# Patient Record
Sex: Female | Born: 1990 | Race: Black or African American | Hispanic: No | Marital: Single | Smoking: Current some day smoker
Health system: Southern US, Community
[De-identification: ages and names within clinical notes are randomized; demographics above are authoritative.]

## PROBLEM LIST (undated history)

## (undated) ENCOUNTER — Inpatient Hospital Stay (HOSPITAL_COMMUNITY): Payer: Self-pay

## (undated) DIAGNOSIS — F329 Major depressive disorder, single episode, unspecified: Secondary | ICD-10-CM

## (undated) DIAGNOSIS — F319 Bipolar disorder, unspecified: Secondary | ICD-10-CM

## (undated) DIAGNOSIS — F32A Depression, unspecified: Secondary | ICD-10-CM

## (undated) DIAGNOSIS — Z202 Contact with and (suspected) exposure to infections with a predominantly sexual mode of transmission: Secondary | ICD-10-CM

## (undated) DIAGNOSIS — O24419 Gestational diabetes mellitus in pregnancy, unspecified control: Secondary | ICD-10-CM

## (undated) HISTORY — PX: MOUTH SURGERY: SHX715

---

## 2009-05-18 ENCOUNTER — Encounter: Payer: Self-pay | Admitting: *Deleted

## 2009-12-24 ENCOUNTER — Ambulatory Visit (HOSPITAL_COMMUNITY): Admission: RE | Admit: 2009-12-24 | Discharge: 2009-12-24 | Payer: Self-pay | Admitting: Obstetrics & Gynecology

## 2010-01-12 ENCOUNTER — Ambulatory Visit (HOSPITAL_COMMUNITY): Admission: RE | Admit: 2010-01-12 | Discharge: 2010-01-12 | Payer: Self-pay | Admitting: Obstetrics & Gynecology

## 2010-03-11 ENCOUNTER — Ambulatory Visit (HOSPITAL_COMMUNITY): Admission: RE | Admit: 2010-03-11 | Discharge: 2010-03-11 | Payer: Self-pay | Admitting: Obstetrics & Gynecology

## 2010-03-25 ENCOUNTER — Ambulatory Visit (HOSPITAL_COMMUNITY): Admission: RE | Admit: 2010-03-25 | Discharge: 2010-03-25 | Payer: Self-pay | Admitting: Obstetrics & Gynecology

## 2010-05-11 ENCOUNTER — Inpatient Hospital Stay (HOSPITAL_COMMUNITY): Admission: AD | Admit: 2010-05-11 | Discharge: 2010-05-11 | Payer: Self-pay | Admitting: Obstetrics & Gynecology

## 2010-05-12 ENCOUNTER — Inpatient Hospital Stay (HOSPITAL_COMMUNITY): Admission: AD | Admit: 2010-05-12 | Discharge: 2010-05-14 | Payer: Self-pay | Admitting: Obstetrics & Gynecology

## 2010-05-20 ENCOUNTER — Inpatient Hospital Stay (HOSPITAL_COMMUNITY): Admission: AD | Admit: 2010-05-20 | Discharge: 2010-05-20 | Payer: Self-pay | Admitting: Obstetrics & Gynecology

## 2010-05-20 ENCOUNTER — Ambulatory Visit: Payer: Self-pay | Admitting: Nurse Practitioner

## 2010-07-25 NOTE — L&D Delivery Note (Signed)
Delivery Note At 6:06 AM a viable and healthy female was delivered via Vaginal, Spontaneous Delivery (Presentation: Left Occiput Anterior).  APGAR: 9, 9; weight  .   Placenta status: Intact, Spontaneous.  Cord: 3 vessels with the following complications: Nuchal cord x 1, reduced after delivery   Anesthesia:  Epidural  Episiotomy: None Lacerations: None Est. Blood Loss (mL): 250  Mom to postpartum.  Baby to nursery-stable.  Clinton Sawyer, Ahjanae Cassel 05/04/2011, 6:25 AM

## 2010-08-06 ENCOUNTER — Emergency Department (HOSPITAL_COMMUNITY)
Admission: EM | Admit: 2010-08-06 | Discharge: 2010-08-06 | Payer: Self-pay | Source: Home / Self Care | Admitting: Emergency Medicine

## 2010-08-07 ENCOUNTER — Inpatient Hospital Stay (HOSPITAL_COMMUNITY)
Admission: AD | Admit: 2010-08-07 | Discharge: 2010-08-07 | Payer: Self-pay | Source: Home / Self Care | Attending: Obstetrics | Admitting: Obstetrics

## 2010-08-09 LAB — GC/CHLAMYDIA PROBE AMP, GENITAL
Chlamydia, DNA Probe: NEGATIVE
GC Probe Amp, Genital: NEGATIVE

## 2010-08-09 LAB — ABO/RH: ABO/RH(D): O POS

## 2010-08-09 LAB — CBC
HCT: 40.3 % (ref 36.0–46.0)
Hemoglobin: 13.1 g/dL (ref 12.0–15.0)
MCH: 27.1 pg (ref 26.0–34.0)
MCHC: 32.5 g/dL (ref 30.0–36.0)
MCV: 83.3 fL (ref 78.0–100.0)
Platelets: 294 10*3/uL (ref 150–400)
RBC: 4.84 MIL/uL (ref 3.87–5.11)
RDW: 13.7 % (ref 11.5–15.5)
WBC: 5 10*3/uL (ref 4.0–10.5)

## 2010-08-09 LAB — WET PREP, GENITAL
Trich, Wet Prep: NONE SEEN
Yeast Wet Prep HPF POC: NONE SEEN

## 2010-08-09 LAB — POCT I-STAT, CHEM 8
BUN: 10 mg/dL (ref 6–23)
Calcium, Ion: 1.08 mmol/L — ABNORMAL LOW (ref 1.12–1.32)
Chloride: 107 mEq/L (ref 96–112)
Creatinine, Ser: 0.6 mg/dL (ref 0.4–1.2)
Glucose, Bld: 89 mg/dL (ref 70–99)
HCT: 42 % (ref 36.0–46.0)
Hemoglobin: 14.3 g/dL (ref 12.0–15.0)
Potassium: 3.5 mEq/L (ref 3.5–5.1)
Sodium: 140 mEq/L (ref 135–145)
TCO2: 25 mmol/L (ref 0–100)

## 2010-08-09 LAB — DIFFERENTIAL
Basophils Absolute: 0.1 10*3/uL (ref 0.0–0.1)
Basophils Relative: 1 % (ref 0–1)
Eosinophils Absolute: 0.1 10*3/uL (ref 0.0–0.7)
Eosinophils Relative: 1 % (ref 0–5)
Lymphocytes Relative: 47 % — ABNORMAL HIGH (ref 12–46)
Lymphs Abs: 2.3 10*3/uL (ref 0.7–4.0)
Monocytes Absolute: 0.4 10*3/uL (ref 0.1–1.0)
Monocytes Relative: 9 % (ref 3–12)
Neutro Abs: 2.1 10*3/uL (ref 1.7–7.7)
Neutrophils Relative %: 42 % — ABNORMAL LOW (ref 43–77)

## 2010-08-09 LAB — HCG, QUANTITATIVE, PREGNANCY: hCG, Beta Chain, Quant, S: 283 m[IU]/mL — ABNORMAL HIGH (ref <5)

## 2010-08-09 LAB — POCT PREGNANCY, URINE: Preg Test, Ur: POSITIVE

## 2010-10-06 LAB — COMPREHENSIVE METABOLIC PANEL
ALT: 11 U/L (ref 0–35)
ALT: 13 U/L (ref 0–35)
AST: 16 U/L (ref 0–37)
AST: 19 U/L (ref 0–37)
Albumin: 3.1 g/dL — ABNORMAL LOW (ref 3.5–5.2)
Albumin: 3.5 g/dL (ref 3.5–5.2)
Alkaline Phosphatase: 108 U/L (ref 39–117)
Alkaline Phosphatase: 160 U/L — ABNORMAL HIGH (ref 39–117)
BUN: 12 mg/dL (ref 6–23)
BUN: 7 mg/dL (ref 6–23)
CO2: 22 mEq/L (ref 19–32)
CO2: 26 mEq/L (ref 19–32)
Calcium: 8.8 mg/dL (ref 8.4–10.5)
Calcium: 8.9 mg/dL (ref 8.4–10.5)
Chloride: 105 mEq/L (ref 96–112)
Chloride: 107 mEq/L (ref 96–112)
Creatinine, Ser: 0.63 mg/dL (ref 0.4–1.2)
Creatinine, Ser: 0.77 mg/dL (ref 0.4–1.2)
GFR calc Af Amer: >60 mL/min (ref >60)
GFR calc Af Amer: >60 mL/min (ref >60)
GFR calc non Af Amer: >60 mL/min (ref >60)
GFR calc non Af Amer: >60 mL/min (ref >60)
Glucose, Bld: 75 mg/dL (ref 70–99)
Glucose, Bld: 91 mg/dL (ref 70–99)
Potassium: 3.6 mEq/L (ref 3.5–5.1)
Potassium: 3.7 mEq/L (ref 3.5–5.1)
Sodium: 135 mEq/L (ref 135–145)
Sodium: 139 mEq/L (ref 135–145)
Total Bilirubin: 0.4 mg/dL (ref 0.3–1.2)
Total Bilirubin: 0.7 mg/dL (ref 0.3–1.2)
Total Protein: 5.9 g/dL — ABNORMAL LOW (ref 6.0–8.3)
Total Protein: 6.9 g/dL (ref 6.0–8.3)

## 2010-10-06 LAB — URINALYSIS, ROUTINE W REFLEX MICROSCOPIC
Bilirubin Urine: NEGATIVE
Glucose, UA: NEGATIVE mg/dL
Hgb urine dipstick: NEGATIVE
Ketones, ur: 15 mg/dL — AB
Ketones, ur: NEGATIVE mg/dL
Nitrite: NEGATIVE
Nitrite: NEGATIVE
Protein, ur: NEGATIVE mg/dL
Specific Gravity, Urine: 1.025 (ref 1.005–1.030)
Urobilinogen, UA: 0.2 mg/dL (ref 0.0–1.0)
pH: 6 (ref 5.0–8.0)
pH: 6 (ref 5.0–8.0)

## 2010-10-06 LAB — CBC
HCT: 38.5 % (ref 36.0–46.0)
HCT: 40 % (ref 36.0–46.0)
HCT: 42.8 % (ref 36.0–46.0)
Hemoglobin: 12.8 g/dL (ref 12.0–15.0)
Hemoglobin: 13.2 g/dL (ref 12.0–15.0)
Hemoglobin: 13.9 g/dL (ref 12.0–15.0)
MCH: 28 pg (ref 26.0–34.0)
MCH: 28.4 pg (ref 26.0–34.0)
MCH: 28.6 pg (ref 26.0–34.0)
MCHC: 32.4 g/dL (ref 30.0–36.0)
MCHC: 33.1 g/dL (ref 30.0–36.0)
MCHC: 33.3 g/dL (ref 30.0–36.0)
MCV: 85.8 fL (ref 78.0–100.0)
MCV: 85.9 fL (ref 78.0–100.0)
MCV: 86.6 fL (ref 78.0–100.0)
Platelets: 212 10*3/uL (ref 150–400)
Platelets: 215 10*3/uL (ref 150–400)
Platelets: 334 10*3/uL (ref 150–400)
RBC: 4.49 MIL/uL (ref 3.87–5.11)
RBC: 4.66 MIL/uL (ref 3.87–5.11)
RBC: 4.94 MIL/uL (ref 3.87–5.11)
RDW: 13.4 % (ref 11.5–15.5)
RDW: 13.6 % (ref 11.5–15.5)
RDW: 13.8 % (ref 11.5–15.5)
WBC: 10 10*3/uL (ref 4.0–10.5)
WBC: 12.2 10*3/uL — ABNORMAL HIGH (ref 4.0–10.5)
WBC: 21.5 10*3/uL — ABNORMAL HIGH (ref 4.0–10.5)

## 2010-10-06 LAB — URINE MICROSCOPIC-ADD ON

## 2010-10-06 LAB — URIC ACID
Uric Acid, Serum: 4.7 mg/dL (ref 2.4–7.0)
Uric Acid, Serum: 5.7 mg/dL (ref 2.4–7.0)

## 2010-10-06 LAB — URINE CULTURE
Colony Count: >=100000
Culture  Setup Time: 201110280253

## 2010-10-06 LAB — RPR: RPR Ser Ql: NONREACTIVE

## 2011-01-19 ENCOUNTER — Inpatient Hospital Stay (HOSPITAL_COMMUNITY)
Admission: AD | Admit: 2011-01-19 | Discharge: 2011-01-19 | Disposition: A | Discharge status: Home / Self Care | Payer: Medicaid Other | Source: Ambulatory Visit | Attending: Obstetrics and Gynecology | Admitting: Obstetrics and Gynecology

## 2011-01-19 ENCOUNTER — Inpatient Hospital Stay (HOSPITAL_COMMUNITY): Payer: Medicaid Other

## 2011-01-19 DIAGNOSIS — R109 Unspecified abdominal pain: Secondary | ICD-10-CM

## 2011-01-19 DIAGNOSIS — O9989 Other specified diseases and conditions complicating pregnancy, childbirth and the puerperium: Secondary | ICD-10-CM

## 2011-01-19 DIAGNOSIS — O99891 Other specified diseases and conditions complicating pregnancy: Secondary | ICD-10-CM | POA: Insufficient documentation

## 2011-01-19 LAB — URINALYSIS, ROUTINE W REFLEX MICROSCOPIC
Ketones, ur: 15 mg/dL — AB
Nitrite: NEGATIVE
Protein, ur: NEGATIVE mg/dL
Urobilinogen, UA: 1 mg/dL (ref 0.0–1.0)
pH: 6 (ref 5.0–8.0)

## 2011-01-21 LAB — URINE CULTURE
Colony Count: 4000
Culture  Setup Time: 201206280423

## 2011-02-11 ENCOUNTER — Emergency Department (HOSPITAL_COMMUNITY)
Admission: EM | Admit: 2011-02-11 | Discharge: 2011-02-11 | Disposition: A | Discharge status: Home / Self Care | Payer: Medicaid Other | Attending: Emergency Medicine | Admitting: Emergency Medicine

## 2011-02-11 DIAGNOSIS — Z711 Person with feared health complaint in whom no diagnosis is made: Secondary | ICD-10-CM | POA: Insufficient documentation

## 2011-03-26 ENCOUNTER — Encounter (HOSPITAL_COMMUNITY): Payer: Self-pay

## 2011-03-26 ENCOUNTER — Inpatient Hospital Stay (HOSPITAL_COMMUNITY)
Admission: AD | Admit: 2011-03-26 | Discharge: 2011-03-26 | Disposition: A | Discharge status: Home Health | Payer: Medicaid Other | Source: Ambulatory Visit | Attending: Obstetrics & Gynecology | Admitting: Obstetrics & Gynecology

## 2011-03-26 DIAGNOSIS — O479 False labor, unspecified: Secondary | ICD-10-CM

## 2011-03-26 DIAGNOSIS — Z3480 Encounter for supervision of other normal pregnancy, unspecified trimester: Secondary | ICD-10-CM

## 2011-03-26 DIAGNOSIS — O4700 False labor before 37 completed weeks of gestation, unspecified trimester: Secondary | ICD-10-CM | POA: Insufficient documentation

## 2011-03-26 LAB — URINALYSIS, ROUTINE W REFLEX MICROSCOPIC
Bilirubin Urine: NEGATIVE
Hgb urine dipstick: NEGATIVE
Ketones, ur: NEGATIVE mg/dL
Nitrite: NEGATIVE
Urobilinogen, UA: 1 mg/dL (ref 0.0–1.0)

## 2011-03-26 LAB — CBC
HCT: 35.5 % — ABNORMAL LOW (ref 36.0–46.0)
Hemoglobin: 11.7 g/dL — ABNORMAL LOW (ref 12.0–15.0)
RDW: 13.3 % (ref 11.5–15.5)
WBC: 6.7 10*3/uL (ref 4.0–10.5)

## 2011-03-26 LAB — DIFFERENTIAL
Basophils Absolute: 0 10*3/uL (ref 0.0–0.1)
Basophils Relative: 0 % (ref 0–1)
Lymphocytes Relative: 35 % (ref 12–46)
Monocytes Absolute: 0.5 10*3/uL (ref 0.1–1.0)
Neutro Abs: 3.8 10*3/uL (ref 1.7–7.7)
Neutrophils Relative %: 57 % (ref 43–77)

## 2011-03-26 NOTE — Progress Notes (Signed)
Pt here to find out the sex of the baby. Pt has had no prenatal care.

## 2011-03-26 NOTE — Progress Notes (Signed)
Onset of lower abdominal cramping for one week, feels baby balling up a lot.

## 2011-03-26 NOTE — ED Notes (Signed)
Beginning of this fetal strip is documented under MR # [id number]

## 2011-03-26 NOTE — ED Provider Notes (Signed)
History     Chief Complaint  Patient presents with  . Abdominal Cramping   Abdominal Cramping This is a new problem. The current episode started in the past 7 days. The onset quality is gradual. The problem occurs intermittently. The pain is located in the RLQ and LLQ. The pain is moderate. The quality of the pain is cramping. The abdominal pain does not radiate. Pertinent negatives include no constipation, diarrhea, dysuria, frequency, nausea, vomiting or weight loss. The pain is aggravated by nothing. The pain is relieved by nothing. She has tried nothing for the symptoms.    OB History    Grav Para Term Preterm Abortions TAB SAB Ect Mult Living   1               No past medical history on file.  No past surgical history on file.  No family history on file.  History  Substance Use Topics  . Smoking status: Not on file  . Smokeless tobacco: Not on file  . Alcohol Use: Not on file    Allergies: No Known Allergies  No prescriptions prior to admission    Review of Systems  Constitutional: Negative for weight loss.  Gastrointestinal: Negative for nausea, vomiting, diarrhea and constipation.  Genitourinary: Negative for dysuria and frequency.   Physical Exam   Blood pressure 118/78, pulse 75, temperature 98.2 F (36.8 C), temperature source Oral, resp. rate 16, height 5\' 8"  (1.727 m), weight 72.122 kg (159 lb).  Physical Exam  Constitutional: She appears well-developed and well-nourished. No distress.  HENT:  Head: Normocephalic.  Eyes: EOM are normal. Pupils are equal, round, and reactive to light.  Neck: Normal range of motion.  Cardiovascular: Normal rate, regular rhythm and normal heart sounds.  Exam reveals no gallop and no friction rub.   No murmur heard. Respiratory: Effort normal and breath sounds normal. No respiratory distress. She has no wheezes. She has no rales. She exhibits no tenderness.  GI: Soft. Bowel sounds are normal. She exhibits no distension  and no mass. There is no tenderness. There is no rebound and no guarding.  Skin: She is not diaphoretic.   Dilation: 1 Effacement (%): Thick Exam by:: Dr. Adrian Blackwater    MAU Course  Procedures NST  FHT 140s, mod variability, + accel, no decel.  Assessment and Plan  1.  Braxton Hicks contractions Preterm labor handout given.  FHT category 1.  Prenatal labs drawn.  Encouraged pt to follow up at low risk clinic.    Willer Osorno JEHIEL 03/26/2011, 6:16 PM

## 2011-03-27 LAB — RPR: RPR Ser Ql: NONREACTIVE

## 2011-03-27 LAB — RUBELLA SCREEN: Rubella: 86 IU/mL — ABNORMAL HIGH

## 2011-03-27 LAB — SICKLE CELL SCREEN: Sickle Cell Screen: NEGATIVE

## 2011-04-13 ENCOUNTER — Encounter: Payer: Self-pay | Admitting: Family Medicine

## 2011-04-13 ENCOUNTER — Other Ambulatory Visit: Payer: Self-pay

## 2011-04-13 ENCOUNTER — Ambulatory Visit (INDEPENDENT_AMBULATORY_CARE_PROVIDER_SITE_OTHER): Payer: Medicaid Other | Admitting: Obstetrics and Gynecology

## 2011-04-13 DIAGNOSIS — O09299 Supervision of pregnancy with other poor reproductive or obstetric history, unspecified trimester: Secondary | ICD-10-CM

## 2011-04-13 LAB — POCT URINALYSIS DIP (DEVICE)
Glucose, UA: NEGATIVE mg/dL
Hgb urine dipstick: NEGATIVE
Nitrite: NEGATIVE
Urobilinogen, UA: 0.2 mg/dL (ref 0.0–1.0)

## 2011-04-13 LAB — CBC
MCV: 82.9 fL (ref 78.0–100.0)
Platelets: 247 10*3/uL (ref 150–400)
RBC: 4.2 MIL/uL (ref 3.87–5.11)
RDW: 13.5 % (ref 11.5–15.5)
WBC: 7.9 10*3/uL (ref 4.0–10.5)

## 2011-04-13 LAB — RPR

## 2011-04-13 MED ORDER — NATALCARE PIC 60-1 MG PO TABS: 1.0000 | ORAL_TABLET | Freq: Every day | ORAL | Status: DC

## 2011-04-13 MED ORDER — PRENATE ELITE 90-600-400 MG-MCG-MCG PO TABS: 1.0000 | ORAL_TABLET | Freq: Every day | ORAL | Status: DC

## 2011-04-13 NOTE — Assessment & Plan Note (Signed)
SGA in woman with history of IUGR with poor prenatal care-will get U/S within the next week and see back in 1 week.

## 2011-04-13 NOTE — Progress Notes (Signed)
Has some pelvic pressure Pt given initial prenatal information. 1hr gtt today lab draw due at 9:15 Pt would like ultrasound. She wants to know sex of baby

## 2011-04-13 NOTE — Progress Notes (Signed)
Subjective:    Sandra Anderson is a G2P1001 [redacted]w[redacted]d being seen today for her first obstetrical visit. Patient was seen on 6/28 at [facility] for u/s but did not follow up and get prenatal care. Patient also seen 9/1 for Braxton Hix at [facility]. 1st trimester labs drawn-no gc/chlamydia. 2nd trimester labs not drawn.     Her obstetrical history is significant for pregnancy induced hypertension and IUGR (mother reports birth weight less than 5 pounds and decreased amniotic fluid).. Patient does not intend to breast feed. Pregnancy history fully reviewed.  Patient reports fatigue, no bleeding, no leaking and occasional contractions. Patient reports no prenatal vitamins but interested in starting. Reports pelvic pressure once yesterday but not regularly. Wants to know sex of child. Interested in contraception- depo provera top choice, also interested in tubal ligation. Minimal interest in IUD. No prenatal classes but interested.   Filed Vitals:   04/13/11 0802  BP: 128/87  Temp: 98.2 F (36.8 C)  Weight: 160 lb 8 oz (72.802 kg)    HISTORY: OB History    Grav Para Term Preterm Abortions TAB SAB Ect Mult Living   2 1 1  0      1     # Outc Date GA Lbr Len/2nd Wgt Sex Del Anes PTL Lv   1 TRM 10/11 [redacted]w[redacted]d   M SVD None No Yes   2 CUR              Past Medical History: Pregnancy induced Hypertension IUGR for first child  Past Surgical History  Procedure Date  . No past surgeries    History reviewed. No pertinent family history. Brother with sickle cell trait from father. Mother reports no sickle cell trait/disease on her side.  Sohx: stays with son's father's family. Not much support from your own family. Lives with son's father's mother, little sister (86), and older sister (60).    Exam    Uterine Size: size less than dates 32 cm for35 weeks  Pelvic Exam:    Perineum: deferred   Vulva: deferred   Vagina:  deferred   pH: deferred   Cervix: deferred   Adnexa: deferred   Bony  Pelvis: deferred  System: Breast:  normal appearance, no masses or tenderness   Skin: normal coloration and turgor, no rashes    Neurologic: oriented, normal, normal mood   Extremities: normal strength, tone, and muscle mass   HEENT PERRLA, extra ocular movement intact and sclera clear, anicteric   Mouth/Teeth mucous membranes moist, pharynx normal without lesions and dental hygiene good   Neck supple   Cardiovascular: regular rate and rhythm, no murmurs or gallops   Respiratory:  appears well, vitals normal, no respiratory distress, acyanotic, normal RR, neck free of mass or lymphadenopathy, chest clear, no wheezing, crepitations, rhonchi, normal symmetric air entry   Abdomen: soft, non-tender; bowel sounds normal; no masses,  no organomegaly   Urinary: deferred      Assessment:    Pregnancy: G2P1001 There is no problem list on file for this patient.       Plan:     See problem list-SGA in woman with history of IUGR with poor prenatal care-will get U/S within the next week and see back in 1 week.  Initial labs drawn previously. Additional labs today-cbc, rpr, hiv, 1 hour glucola, antibody screen, urine culture (protein and small LE) Deferred pelvic exam until next visit when patient will need GC/Chlamydia/GBS and pap.  Prenatal vitamins.-will give samples and prescription  Genetic Screening discussed-first prenatal visit at 35 weeks so not performed.  Ultrasound discussed; fetal survey: results reviewed.   Tana Conch 04/13/2011

## 2011-04-13 NOTE — Progress Notes (Signed)
Addended by: Caren Griffins C on: 04/13/2011 10:33 AM   Modules accepted: Level of Service

## 2011-04-13 NOTE — Progress Notes (Signed)
Addended by: Faythe Casa on: 04/13/2011 01:50 PM   Modules accepted: Orders

## 2011-04-13 NOTE — Patient Instructions (Addendum)
The nurse will schedule you for an ultrasound. We would like for you to come back in one week. We hope to have your ultrasound completed before your next appointment.   Make sure to pick up your prenatal pills at the pharmacy.   Kick Count Fetal Movement Counts  Kick counts are a good idea for every pregnant woman to do. Start counting fetal movements at 28 weeks of the pregnancy. Fetal movements increase after eating a full meal or eating or drinking something sweet (the blood sugar is higher). It is also important to drink plenty of fluids (well hydrated) before doing the count. Lie on your left side because it helps with the circulation or you can sit in a comfortable chair with your arms over your belly (abdomen) with no distractions around you. DOING THE COUNT:  Try to do the count the same time of day each time you do it.   Mark the day and time, then see how long it takes for you to feel 10 movements (kicks, flutters, swishes, rolls). You should have at least 10 movements within 2 hours. You will most likely feel 10 movements in much less than 2 hours. If you do not, wait an hour and count again. After a couple of days you will see a pattern.   What you are looking for is a change in the pattern or not enough counts in 2 hours. Is it taking longer in time to reach 10 movements?  SEEK MEDICAL CARE IF:  You feel less than 10 counts in 2 hours. Tried twice.   No movement in one hour.   The pattern is changing or taking longer each day to reach 10 counts in 2 hours.   You feel the baby is not moving as it usually does.   Date  Movements Start time Doreatha Martin time  Date  Movements Start time Miguel Barrera  time    Sun      Sun     W  JYN    W  Mon     E  Tue    E  Tue     E  Wed    E  Wed     K  Thur    K  Thur       Fri      Fri       WGN      Sat       Sun      Sun     W  Mon    W  Mon     E  Tue    E  Tue     E  Wed    E  Wed     K  Thur    K  Clovis Cao       Fri      Fri       Sat       Sat       Sun      Sun     W  Mon    W  Mon     E  Tue    E  Tue     E  Wed    E  Wed     K  Clovis Cao    K  Clovis Cao       Fri      Fri       Sat      Sat  Sun      Sun     W  Mon    W  Mon     E  Tue    E  Tue     E  Wed    E  Wed     K  Willaim Rayas       Fri      Fri       ZOX      Sat     Document Released: 08/10/2006 Document Re-Released: 12/29/2009 [facility] Patient Information 2011 [place], [place].

## 2011-04-13 NOTE — Progress Notes (Signed)
I saw pt with the resident and made plan.

## 2011-04-14 LAB — HIV ANTIBODY (ROUTINE TESTING W REFLEX): HIV: NONREACTIVE

## 2011-04-14 LAB — DRUG SCREEN, URINE
Barbiturate Quant, Ur: NEGATIVE
Benzodiazepines.: NEGATIVE
Cocaine Metabolites: NEGATIVE
Creatinine,U: 281 mg/dL

## 2011-04-14 LAB — GLUCOSE TOLERANCE, 1 HOUR: Glucose, 1 Hour GTT: 113 mg/dL (ref 70–140)

## 2011-04-14 LAB — ANTIBODY SCREEN: Antibody Screen: NEGATIVE

## 2011-04-15 ENCOUNTER — Ambulatory Visit (HOSPITAL_COMMUNITY)
Admission: RE | Admit: 2011-04-15 | Discharge: 2011-04-15 | Disposition: A | Discharge status: Home / Self Care | Payer: Medicaid Other | Source: Ambulatory Visit | Attending: Obstetrics and Gynecology | Admitting: Obstetrics and Gynecology

## 2011-04-15 DIAGNOSIS — O365990 Maternal care for other known or suspected poor fetal growth, unspecified trimester, not applicable or unspecified: Secondary | ICD-10-CM | POA: Insufficient documentation

## 2011-04-15 DIAGNOSIS — Z3689 Encounter for other specified antenatal screening: Secondary | ICD-10-CM | POA: Insufficient documentation

## 2011-04-16 LAB — URINE CULTURE: Colony Count: 70000

## 2011-05-04 ENCOUNTER — Encounter (HOSPITAL_COMMUNITY): Payer: Self-pay | Admitting: Anesthesiology

## 2011-05-04 ENCOUNTER — Encounter (HOSPITAL_COMMUNITY): Payer: Self-pay | Admitting: *Deleted

## 2011-05-04 ENCOUNTER — Inpatient Hospital Stay (HOSPITAL_COMMUNITY): Payer: Medicaid Other | Admitting: Anesthesiology

## 2011-05-04 ENCOUNTER — Inpatient Hospital Stay (HOSPITAL_COMMUNITY)
Admission: AD | Admit: 2011-05-04 | Discharge: 2011-05-06 | DRG: 775 | Disposition: A | Discharge status: Home / Self Care | Payer: Medicaid Other | Source: Ambulatory Visit | Attending: Obstetrics & Gynecology | Admitting: Obstetrics & Gynecology

## 2011-05-04 DIAGNOSIS — O0930 Supervision of pregnancy with insufficient antenatal care, unspecified trimester: Secondary | ICD-10-CM

## 2011-05-04 DIAGNOSIS — IMO0001 Reserved for inherently not codable concepts without codable children: Secondary | ICD-10-CM

## 2011-05-04 DIAGNOSIS — O4290 Premature rupture of membranes, unspecified as to length of time between rupture and onset of labor, unspecified weeks of gestation: Secondary | ICD-10-CM

## 2011-05-04 LAB — RAPID URINE DRUG SCREEN, HOSP PERFORMED
Benzodiazepines: NOT DETECTED
Opiates: NOT DETECTED

## 2011-05-04 LAB — CBC
MCH: 26.2 pg (ref 26.0–34.0)
Platelets: 217 10*3/uL (ref 150–400)
RBC: 4.2 MIL/uL (ref 3.87–5.11)

## 2011-05-04 LAB — RPR: RPR Ser Ql: NONREACTIVE

## 2011-05-04 MED ORDER — PHENYLEPHRINE 40 MCG/ML (10ML) SYRINGE FOR IV PUSH (FOR BLOOD PRESSURE SUPPORT)
80.0000 ug | PREFILLED_SYRINGE | INTRAVENOUS | Status: DC | PRN
  Filled 2011-05-04 (×2): qty 5

## 2011-05-04 MED ORDER — OXYCODONE-ACETAMINOPHEN 5-325 MG PO TABS: 1.0000 | ORAL_TABLET | ORAL | Status: DC | PRN

## 2011-05-04 MED ORDER — OXYCODONE-ACETAMINOPHEN 5-325 MG PO TABS: 2.0000 | ORAL_TABLET | ORAL | Status: DC | PRN

## 2011-05-04 MED ORDER — OXYTOCIN BOLUS FROM INFUSION
500.0000 mL | Freq: Once | INTRAVENOUS | Status: DC
  Filled 2011-05-04: qty 1000
  Filled 2011-05-04: qty 500

## 2011-05-04 MED ORDER — ACETAMINOPHEN 325 MG PO TABS: 650.0000 mg | ORAL_TABLET | ORAL | Status: DC | PRN

## 2011-05-04 MED ORDER — IBUPROFEN 600 MG PO TABS
600.0000 mg | ORAL_TABLET | Freq: Four times a day (QID) | ORAL | Status: DC | PRN
  Filled 2011-05-04: qty 1

## 2011-05-04 MED ORDER — SENNOSIDES-DOCUSATE SODIUM 8.6-50 MG PO TABS
2.0000 | ORAL_TABLET | Freq: Every day | ORAL | Status: DC
Start: 2011-05-04 — End: 2011-05-06
  Administered 2011-05-05: 2 via ORAL

## 2011-05-04 MED ORDER — LIDOCAINE HCL (PF) 1 % IJ SOLN
30.0000 mL | INTRAMUSCULAR | Status: DC | PRN
  Filled 2011-05-04 (×2): qty 30

## 2011-05-04 MED ORDER — BENZOCAINE-MENTHOL 20-0.5 % EX AERO
INHALATION_SPRAY | CUTANEOUS | Status: AC
  Filled 2011-05-04: qty 56

## 2011-05-04 MED ORDER — FENTANYL 2.5 MCG/ML BUPIVACAINE 1/10 % EPIDURAL INFUSION (WH - ANES)
INTRAMUSCULAR | Status: DC | PRN
  Administered 2011-05-04: 14 mL/h via EPIDURAL

## 2011-05-04 MED ORDER — PHENYLEPHRINE 40 MCG/ML (10ML) SYRINGE FOR IV PUSH (FOR BLOOD PRESSURE SUPPORT)
80.0000 ug | PREFILLED_SYRINGE | INTRAVENOUS | Status: DC | PRN
  Filled 2011-05-04: qty 5

## 2011-05-04 MED ORDER — FLEET ENEMA 7-19 GM/118ML RE ENEM: 1.0000 | ENEMA | RECTAL | Status: DC | PRN

## 2011-05-04 MED ORDER — DIBUCAINE 1 % RE OINT: 1.0000 "application " | TOPICAL_OINTMENT | RECTAL | Status: DC | PRN

## 2011-05-04 MED ORDER — OXYTOCIN 20 UNITS IN LACTATED RINGERS INFUSION - SIMPLE
125.0000 mL/h | Freq: Once | INTRAVENOUS | Status: AC
  Administered 2011-05-04: 125 mL/h via INTRAVENOUS

## 2011-05-04 MED ORDER — IBUPROFEN 600 MG PO TABS
600.0000 mg | ORAL_TABLET | Freq: Four times a day (QID) | ORAL | Status: DC
Start: 2011-05-04 — End: 2011-05-06
  Administered 2011-05-04 – 2011-05-06 (×10): 600 mg via ORAL
  Filled 2011-05-04 (×9): qty 1

## 2011-05-04 MED ORDER — NALBUPHINE HCL 10 MG/ML IJ SOLN
10.0000 mg | INTRAMUSCULAR | Status: DC | PRN
  Filled 2011-05-04: qty 1

## 2011-05-04 MED ORDER — DIPHENHYDRAMINE HCL 50 MG/ML IJ SOLN: 12.5000 mg | INTRAMUSCULAR | Status: DC | PRN

## 2011-05-04 MED ORDER — EPHEDRINE 5 MG/ML INJ
10.0000 mg | INTRAVENOUS | Status: DC | PRN
  Filled 2011-05-04 (×2): qty 4

## 2011-05-04 MED ORDER — LACTATED RINGERS IV SOLN: 500.0000 mL | INTRAVENOUS | Status: DC | PRN

## 2011-05-04 MED ORDER — LANOLIN HYDROUS EX OINT: TOPICAL_OINTMENT | CUTANEOUS | Status: DC | PRN

## 2011-05-04 MED ORDER — ZOLPIDEM TARTRATE 5 MG PO TABS: 5.0000 mg | ORAL_TABLET | Freq: Every evening | ORAL | Status: DC | PRN

## 2011-05-04 MED ORDER — PRENATAL PLUS 27-1 MG PO TABS
1.0000 | ORAL_TABLET | Freq: Every day | ORAL | Status: DC
  Administered 2011-05-05 – 2011-05-06 (×2): 1 via ORAL
  Filled 2011-05-04 (×2): qty 1

## 2011-05-04 MED ORDER — FENTANYL 2.5 MCG/ML BUPIVACAINE 1/10 % EPIDURAL INFUSION (WH - ANES)
14.0000 mL/h | INTRAMUSCULAR | Status: DC
  Filled 2011-05-04: qty 60

## 2011-05-04 MED ORDER — SIMETHICONE 80 MG PO CHEW: 80.0000 mg | CHEWABLE_TABLET | ORAL | Status: DC | PRN

## 2011-05-04 MED ORDER — WITCH HAZEL-GLYCERIN EX PADS: 1.0000 "application " | MEDICATED_PAD | CUTANEOUS | Status: DC | PRN

## 2011-05-04 MED ORDER — ONDANSETRON HCL 4 MG/2ML IJ SOLN: 4.0000 mg | Freq: Four times a day (QID) | INTRAMUSCULAR | Status: DC | PRN

## 2011-05-04 MED ORDER — BENZOCAINE-MENTHOL 20-0.5 % EX AERO: 1.0000 "application " | INHALATION_SPRAY | CUTANEOUS | Status: DC | PRN

## 2011-05-04 MED ORDER — ONDANSETRON HCL 4 MG/2ML IJ SOLN: 4.0000 mg | INTRAMUSCULAR | Status: DC | PRN

## 2011-05-04 MED ORDER — LIDOCAINE HCL 1.5 % IJ SOLN
INTRAMUSCULAR | Status: DC | PRN
  Administered 2011-05-04 (×2): 5 mL via EPIDURAL

## 2011-05-04 MED ORDER — LACTATED RINGERS IV SOLN
INTRAVENOUS | Status: DC
  Administered 2011-05-04 (×2): via INTRAVENOUS

## 2011-05-04 MED ORDER — DIPHENHYDRAMINE HCL 25 MG PO CAPS: 25.0000 mg | ORAL_CAPSULE | Freq: Four times a day (QID) | ORAL | Status: DC | PRN

## 2011-05-04 MED ORDER — EPHEDRINE 5 MG/ML INJ
10.0000 mg | INTRAVENOUS | Status: DC | PRN
  Filled 2011-05-04: qty 4

## 2011-05-04 MED ORDER — ONDANSETRON HCL 4 MG PO TABS: 4.0000 mg | ORAL_TABLET | ORAL | Status: DC | PRN

## 2011-05-04 MED ORDER — TETANUS-DIPHTH-ACELL PERTUSSIS 5-2.5-18.5 LF-MCG/0.5 IM SUSP: 0.5000 mL | Freq: Once | INTRAMUSCULAR | Status: DC

## 2011-05-04 MED ORDER — CITRIC ACID-SODIUM CITRATE 334-500 MG/5ML PO SOLN: 30.0000 mL | ORAL | Status: DC | PRN

## 2011-05-04 MED ORDER — LACTATED RINGERS IV SOLN
500.0000 mL | Freq: Once | INTRAVENOUS | Status: AC
  Administered 2011-05-04: 1000 mL via INTRAVENOUS

## 2011-05-04 NOTE — Anesthesia Postprocedure Evaluation (Signed)
  Anesthesia Post-op Note  Patient: Sandra Anderson  Procedure(s) Performed: * No procedures listed *  Patient Location: Mother/Baby  Anesthesia Type: Epidural  Level of Consciousness: alert  and oriented  Airway and Oxygen Therapy: Patient Spontanous Breathing  Post-op Pain: mild  Post-op Assessment: Patient's Cardiovascular Status Stable and Respiratory Function Stable  Post-op Vital Signs: stable  Complications: No apparent anesthesia complications

## 2011-05-04 NOTE — Anesthesia Procedure Notes (Signed)
Epidural Patient location during procedure: OB Start time: 05/04/2011 5:10 AM End time: 05/04/2011 5:15 AM Reason for block: procedure for pain  Staffing Anesthesiologist: Sandrea Hughs Performed by: anesthesiologist   Preanesthetic Checklist Completed: patient identified, site marked, surgical consent, pre-op evaluation, timeout performed, IV checked, risks and benefits discussed and monitors and equipment checked  Epidural Patient position: sitting Prep: site prepped and draped and DuraPrep Patient monitoring: continuous pulse ox and blood pressure Approach: midline Injection technique: LOR air  Needle:  Needle type: Tuohy  Needle gauge: 17 G Needle length: 9 cm Needle insertion depth: 4 cm Catheter type: closed end flexible Catheter size: 19 Gauge Catheter at skin depth: 9 cm Test dose: negative and 1.5% lidocaine  Assessment Sensory level: T10 Events: blood not aspirated, injection not painful, no injection resistance, negative IV test and no paresthesia

## 2011-05-04 NOTE — ED Provider Notes (Signed)
Sandra Anderson is a 20 y.o. female presenting for rupture of membranes and onset of labor History OB History    Grav Para Term Preterm Abortions TAB SAB Ect Mult Living   2 1 1  0      1    First pregnancy: Hypertension and IUGR  This Pregnancy: 1 prenatal visit, two visits to [facility]U, pre-natal u/s x 2 WNL, prenatal labs WNL; GBS unknown    Past Medical History  Diagnosis Date  . No pertinent past medical history    Past Surgical History  Procedure Date  . No past surgeries    Family History: family history is not on file. Social History:  reports that she has never smoked. She has never used smokeless tobacco. She reports that she does not drink alcohol or use illicit drugs.  Review of Systems  All other systems reviewed and are negative.     Dilation: 4.5 Effacement (%): 70 Station: -1 Exam by:: DCALLAWAY, RN Blood pressure 139/96, pulse 80, temperature 98.7 F (37.1 C), resp. rate 20, height 5\' 8"  (1.727 m), weight 142 lb (64.411 kg), unknown if currently breastfeeding. Maternal Exam:  Uterine Assessment: Contraction strength is firm.  Contraction duration is 3 minutes. Contraction frequency is regular.   Abdomen: Patient reports no abdominal tenderness.   Fetal Exam Fetal Monitor Review: Mode: fetoscope.   Variability: moderate (6-25 bpm).   Pattern: no decelerations and no accelerations.    Fetal State Assessment: Category I - tracings are normal.     Physical Exam  Constitutional: She is oriented to person, place, and time. She appears well-developed and well-nourished. No distress.  Neck: Normal range of motion. Neck supple.  Cardiovascular: Normal rate and regular rhythm.   No murmur heard. Respiratory: Effort normal and breath sounds normal.  GI:       Gravid, non-tender  Musculoskeletal: She exhibits no edema.  Neurological: She is alert and oriented to person, place, and time. No cranial nerve deficit.  Skin: Skin is warm and dry.  Psychiatric: She has  a normal mood and affect. Her behavior is normal. Judgment and thought content normal.    Prenatal labs: ABO, Rh: --/--/O POS (01/13 1302) Antibody: NEG (09/19 1014) Rubella: 86.0 (09/01 1900) RPR: NON REAC (09/19 1014)  HBsAg: NEGATIVE (09/01 1900)  HIV: NON REACTIVE (09/19 1014)  GBS:   Unknown  Assessment/Plan: Ms. Derick has had ROM 2 hours ago and is in active labor. 1. Admit to Labor and Delivery Service under attending physician Dr. Macon Large  2. Pain Control: Epidural PRN, Tylenol PRN 3. Monitors: Tocometer, Fetoscope 4. FENGI: LR @ 125 cc/hr; clear liquid diet   Marymargaret Kirker 05/04/2011, 1:13 AM

## 2011-05-04 NOTE — Anesthesia Postprocedure Evaluation (Signed)
Anesthesia Post Note  Patient: Sandra Anderson  Procedure(s) Performed: * No procedures listed *  Anesthesia type: Epidural  Patient location: Mother/Baby  Post pain: Pain level controlled  Post assessment: Post-op Vital signs reviewed  Last Vitals:  Filed Vitals:   05/04/11 0731  BP: 135/87  Pulse: 60  Temp:   Resp: 18    Post vital signs: Reviewed  Level of consciousness: awake  Complications: No apparent anesthesia complications

## 2011-05-04 NOTE — Progress Notes (Signed)
SAYS WENT TO CLINIC   IN September - ONLY VISIT- DREW LABS AND URINE. HAD U/S   .  HAS APPOINTMENT TOMORROW.

## 2011-05-04 NOTE — Progress Notes (Signed)
Encounter addended by: Fanny Dance on: 05/04/2011  4:36 PM<BR>     Documentation filed: Notes Section

## 2011-05-04 NOTE — Progress Notes (Signed)
Sandra Anderson is a 20 y.o. G2P1001 at [redacted]w[redacted]d admitted for active labor, rupture of membranes  Subjective:   Objective: BP 134/90  Pulse 75  Temp(Src) 98 F (36.7 C) (Oral)  Resp 18  Ht 5\' 8"  (1.727 m)  Wt 142 lb (64.411 kg)  BMI 21.59 kg/m2  Breastfeeding? Unknown      FHT:  FHR: 140 bpm, variability: moderate,  accelerations:  Abscent,  decelerations:  Absent UC:   regular, every 2.5 minutes SVE:   Dilation: 5 Effacement (%): 70 Station: -2 Exam by:: e.foley,rn  Labs: Lab Results  Component Value Date   WBC 9.2 05/04/2011   HGB 11.0* 05/04/2011   HCT 34.3* 05/04/2011   MCV 81.7 05/04/2011   PLT 217 05/04/2011    Assessment / Plan: Spontaneous labor, progressing normally  Labor: Progressing normally Fetal Wellbeing:  Category I Pain Control:  Labor support without medications Anticipated MOD:  NSVD  Hades Mathew 05/04/2011, 4:13 AM

## 2011-05-04 NOTE — Progress Notes (Signed)
ALL BP  ARE WITH UC-  DENIES H/A, ALTHOUGH HAD 1 TODAY- NONE NOW.,  NO VISUAL CHANGES, DENIES EPIGASTRIC PAIN.

## 2011-05-04 NOTE — Anesthesia Preprocedure Evaluation (Addendum)
Anesthesia Evaluation  Name, MR# and DOB Patient awake  General Assessment Comment  Reviewed: Allergy & Precautions, H&P , NPO status , Patient's Chart, lab work & pertinent test results  Airway Mallampati: I TM Distance: >3 FB Neck ROM: full    Dental No notable dental hx.    Pulmonary    Pulmonary exam normal       Cardiovascular     Neuro/Psych Negative Neurological ROS  Negative Psych ROS   GI/Hepatic negative GI ROS Neg liver ROS    Endo/Other  Negative Endocrine ROS  Renal/GU negative Renal ROS  Genitourinary negative   Musculoskeletal negative musculoskeletal ROS (+)   Abdominal Normal abdominal exam  (+)   Peds negative pediatric ROS (+)  Hematology negative hematology ROS (+)   Anesthesia Other Findings   Reproductive/Obstetrics (+) Pregnancy                           Anesthesia Physical Anesthesia Plan  ASA: II  Anesthesia Plan: Epidural   Post-op Pain Management:    Induction:   Airway Management Planned:   Additional Equipment:   Intra-op Plan:   Post-operative Plan:   Informed Consent: I have reviewed the patients History and Physical, chart, labs and discussed the procedure including the risks, benefits and alternatives for the proposed anesthesia with the patient or authorized representative who has indicated his/her understanding and acceptance.     Plan Discussed with:   Anesthesia Plan Comments:         Anesthesia Quick Evaluation

## 2011-05-04 NOTE — Progress Notes (Signed)
PT ARRIVED VIA EMS-  SAYING SROM- 2345- CLEAR.  DENIES UC.  PNC- CLINIC- VE- NOT CHECKED.

## 2011-05-05 NOTE — Progress Notes (Signed)
Post Partum Day 1 Subjective: no complaints, up ad lib, voiding, tolerating PO and bottlefeeding.  Objective: Blood pressure 122/77, pulse 64, temperature 97.9 F (36.6 C), temperature source Oral, resp. rate 18, height 5\' 8"  (1.727 m), weight 64.411 kg (142 lb), SpO2 99.00%, unknown if currently breastfeeding.  Physical Exam:  General: alert, cooperative and appears stated age Lochia: appropriate Uterine Fundus: firm; 2 below umbilicus DVT Evaluation: No evidence of DVT seen on physical exam.   Basename 05/04/11 0119  HGB 11.0*  HCT 34.3*    Assessment/Plan: Plan for discharge tomorrow and Contraception depo-provera   LOS: 1 day   [facility] 05/05/2011, 9:41 AM

## 2011-05-05 NOTE — Progress Notes (Signed)
UR Chart review completed.  

## 2011-05-06 NOTE — Discharge Summary (Signed)
Obstetric Discharge Summary Reason for Admission: onset of labor and rupture of membranes on 05/04/11  Prenatal Procedures: ultrasound and no formal pre-natal care  Intrapartum Procedures: spontaneous vaginal delivery Postpartum Procedures: none Complications-Operative and Postpartum: none Hemoglobin  Date Value Range Status  05/04/2011 11.0* 12.0-15.0 (g/dL) Final     HCT  Date Value Range Status  05/04/2011 34.3* 36.0-46.0 (%) Final   Hospital Course: [name] is a 20 year old G2P2002 who presented to the [facility] of [facility] on 10/10 after spontaneous ROM and onset of labor at 38.[redacted] weeks EGA. She delivered by NSVD on 10/10, and the only complication was a nuchal cord x 1. This was reduced immediately after delivery. The rest of her hospital course was uncomplicated. On PPD#2 the patient is stable and appropriate for discharge. She will come to the clinic at [facility] on 10/15 at 11:00 AM for Depo injection. Then she will f/u at [facility] for her 6 week post-visit.    Discharge Diagnoses: Term Pregnancy-delivered  Discharge Information: Date: 05/06/2011 Activity: pelvic rest Diet: routine Medications: None Condition: stable Instructions: refer to practice specific booklet Discharge to: home Follow-up Information    Follow up with [facility] in 3 days. (Come to [facility] on Monday at 11 AM for  Depo shot)       Follow up with [facility] in 6 weeks. (Standard 6 weeks post-partum follow-up visit)          Newborn Data: Live born female  Birth Weight: 6 lb 13 oz (3090 g) APGAR: 9, 9  Home with mother.  Mat Carne 05/06/2011, 12:48 PM

## 2011-05-06 NOTE — Progress Notes (Signed)
Post Partum Day 2 s/p NSVD, female  Subjective: no complaints, up ad lib, voiding and tolerating PO  Objective: Blood pressure 133/89, pulse 82, temperature 98.2 F (36.8 C), temperature source Oral, resp. rate 18, height 5\' 8"  (1.727 m), weight 142 lb (64.411 kg), SpO2 99.00%, unknown if currently breastfeeding.  Physical Exam:  General: alert, cooperative, appears stated age and no distress Lochia: appropriate Uterine Fundus: firm DVT Evaluation: No evidence of DVT seen on physical exam.   Basename 05/04/11 0119  HGB 11.0*  HCT 34.3*    Assessment/Plan: Discharge home and Contraception Depoprovera - Will receive Depo shot at f/u visit on Monday    LOS: 2 days   Mat Carne 05/06/2011, 12:25 PM

## 2011-05-06 NOTE — H&P (Signed)
  Sandra Anderson is a 20 y.o. female presenting for rupture of membranes and onset of labor History OB History   Grav Para Term Preterm Abortions TAB SAB Ect Mult Living  2 1 1  0      1  First pregnancy: Hypertension and IUGR  This Pregnancy: 1 prenatal visit, two visits to [facility]U, pre-natal u/s x 2 WNL, prenatal labs WNL; GBS unknown    Past Medical History Diagnosis Date . No pertinent past medical history   Past Surgical History Procedure Date . No past surgeries   Family History: family history is not on file. Social History:  reports that she has never smoked. She has never used smokeless tobacco. She reports that she does not drink alcohol or use illicit drugs.  Review of Systems  All other systems reviewed and are negative.     Dilation: 4.5 Effacement (%): 70 Station: -1 Exam by:: DCALLAWAY, RN Blood pressure 139/96, pulse 80, temperature 98.7 F (37.1 C), resp. rate 20, height 5\' 8"  (1.727 m), weight 142 lb (64.411 kg), unknown if currently breastfeeding. Maternal Exam:  Uterine Assessment: Contraction strength is firm.  Contraction duration is 3 minutes. Contraction frequency is regular.   Abdomen: Patient reports no abdominal tenderness.   Fetal Exam Fetal Monitor Review: Mode: fetoscope.   Variability: moderate (6-25 bpm).   Pattern: no decelerations and no accelerations.    Fetal State Assessment: Category I - tracings are normal.     Physical Exam  Constitutional: She is oriented to person, place, and time. She appears well-developed and well-nourished. No distress.  Neck: Normal range of motion. Neck supple.  Cardiovascular: Normal rate and regular rhythm.   No murmur heard. Respiratory: Effort normal and breath sounds normal.  GI:       Gravid, non-tender  Musculoskeletal: She exhibits no edema.  Neurological: She is alert and oriented to person, place, and time. No cranial nerve deficit.  Skin: Skin is warm and dry.  Psychiatric: She has a normal  mood and affect. Her behavior is normal. Judgment and thought content normal.    Prenatal labs: ABO, Rh: --/--/O POS (01/13 1302) Antibody: NEG (09/19 1014) Rubella: 86.0 (09/01 1900) RPR: NON REAC (09/19 1014)  HBsAg: NEGATIVE (09/01 1900)  HIV: NON REACTIVE (09/19 1014)  GBS:   Unknown  Assessment/Plan: Ms. Gilkey has had ROM 2 hours ago and is in active labor. 1. Admit to Labor and Delivery Service under attending physician Dr. Macon Large  2. Pain Control: Epidural PRN, Tylenol PRN 3. Monitors: Tocometer, Fetoscope 4. FENGI: LR @ 125 cc/hr; clear liquid diet   Merdis Snodgrass 05/04/2011, 1:13 AM

## 2011-05-06 NOTE — Progress Notes (Signed)
PSYCHOSOCIAL ASSESSMENT ~ MATERNAL/CHILD Name:   Sandra Anderson                                                                                       Age: 20   Referral Date:      18  / 32   /  38 Reason/Source: History of MJ use / CN  I. FAMILY/HOME ENVIRONMENT A. Child's Legal Guardian _X__Parent(s) ___Grandparent ___Foster parent ___DSS_________________ Name:  Sandra Anderson                                                               DOB: //                     Age: 30  Address: [address] ; [place], [place] [address]  Name:     Sandra Anderson                                                           DOB: //                     Age: 89  Address: (same as above)  B. Other Household Members/Support Persons Name:  Sandra Anderson          Relationship: mother           DOB ___/___/___                   Name:  Sandra Anderson             Relationship: son                 DOB [birth year]                   Name:                                         Relationship:                        DOB ___/___/___                   Name:                                         Relationship:                        DOB ___/___/___  C. Other Support:   II. PSYCHOSOCIAL DATA A. Information Source  _X_Patient Interview  __Family Interview           __Other___________  B. Surveyor, quantity and [employer] __Employment: _X_Medicaid    [place]: [place]                  __Private Insurance:                   __Self Pay  _X_Food Stamps   _X_WIC _X_[employer] First     __Public Housing     __Section 8    __Maternity Care Coordination/Child Service Coordination/Early Intervention   ___School:                                                                         Grade:  __Other:   Salena Saner Cultural and Environment Information Cultural Issues Impacting Care:  III. STRENGTHS _X__Supportive family/friends _X__Adequate  Resources ___Compliance with medical plan _X__Home prepared for Child (including basic supplies) ___Understanding of illness      ___Other: RISK FACTORS AND CURRENT PROBLEMS         ____No Problems Noted         History of MJ                                                                                                                                                                                                                                            IV. SOCIAL WORK ASSESSMENT  Pt admits to smoking MJ, "once or twice a month" prior to pregnancy confirmation at 5 months.  Once pregnancy was confirmed, she increased use to " once a week" to help with an appetite.  The last time pt used was last month.  She denies other illegal substance use.  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  Sw advised pt that a CPS report would be made.  Pt did not seem concerned and did not have questions for this Sw.  FOB is at the bedside sleeping.  Pt's support system is limited.  She has some supplies for the baby but expressed need for clothes and blankets.  Sw provided pt with a  bundle pack.  Pt affect seem flat but she denies any depressed symptoms.  Sw will report positive drug screen to CPS and assist further if needed.   V. SOCIAL WORK PLAN  _X__No Further Intervention Required/No Barriers to Discharge   ___Psychosocial Support and Ongoing Assessment of Needs   ___Patient/Family Education:   _X_ Child Protective Services Report   County: [place]  Date: 05/06/11  ___Information/Referral to [employer]_________________________   ___Other:

## 2011-05-09 ENCOUNTER — Ambulatory Visit (INDEPENDENT_AMBULATORY_CARE_PROVIDER_SITE_OTHER): Payer: Medicaid Other | Admitting: *Deleted

## 2011-05-09 ENCOUNTER — Ambulatory Visit: Payer: Medicaid Other

## 2011-05-09 VITALS — BP 132/82 | HR 71 | Temp 97.9°F

## 2011-05-09 DIAGNOSIS — Z3042 Encounter for surveillance of injectable contraceptive: Secondary | ICD-10-CM

## 2011-05-09 DIAGNOSIS — Z3049 Encounter for surveillance of other contraceptives: Secondary | ICD-10-CM

## 2011-05-09 MED ORDER — MEDROXYPROGESTERONE ACETATE 150 MG/ML IM SUSP
150.0000 mg | Freq: Once | INTRAMUSCULAR | Status: AC
  Administered 2011-05-09: 150 mg via INTRAMUSCULAR

## 2011-05-09 NOTE — ED Provider Notes (Signed)
Attestation of Attending Supervision of Resident: Evaluation and management procedures were performed by the [facility] Medicine Resident under my supervision.  I have reviewed the resident's note, chart reviewed and agree with management and plan.  ANYANWU,UGONNA A 05/09/2011 12:05 PM

## 2011-05-09 NOTE — H&P (Signed)
Attestation of Attending Supervision of Resident: Evaluation and management procedures were performed by the Family Medicine Resident under my supervision.  I have reviewed the resident's note, chart reviewed and agree with management and plan.  Shaw Dobek A 05/09/2011 12:05 PM       

## 2011-05-09 NOTE — Progress Notes (Signed)
Pt in for Depo Provera, had baby on 10/10. Was discharged on 10/13 and advised to come to clinic today by Dr. Macon Large for Depo Provera. Pt denies any sexual intercourse since delivery.

## 2011-06-09 ENCOUNTER — Ambulatory Visit: Payer: Medicaid Other | Admitting: Obstetrics and Gynecology

## 2011-10-18 IMAGING — US US OB TRANSVAGINAL MODIFY
1 series · 13 of 28 positions shown · non-contrast
Comparison: None.

CLINICAL DATA: Bleeding for 3 days.  Postpartum with delivery
06/12/2010.  Beta HCG 283

OBSTETRIC <14 WK US AND TRANSVAGINAL OB US
TECHNIQUE: Both transabdominal and transvaginal ultrasound
examinations were performed for complete evaluation of the
gestation as well as the maternal uterus, adnexal regions, and
pelvic cul-de-sac.  Transvaginal technique was performed to assess
early pregnancy.

[Series 1: us ob transvaginal modify · 0.17mm/px · 13 of 63 slices shown]
[im 3/63]
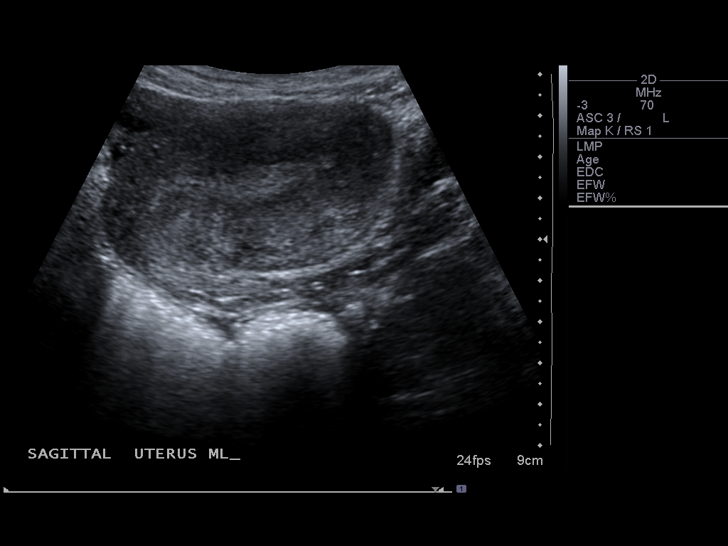
[im 7/63]
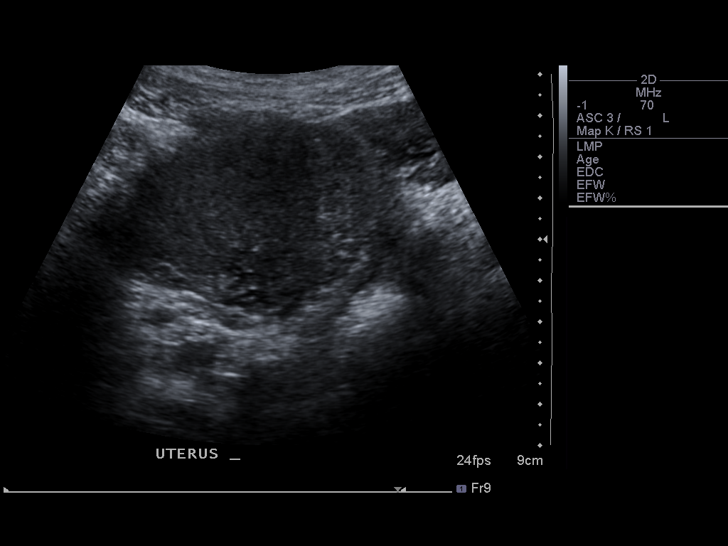
[im 12/63]
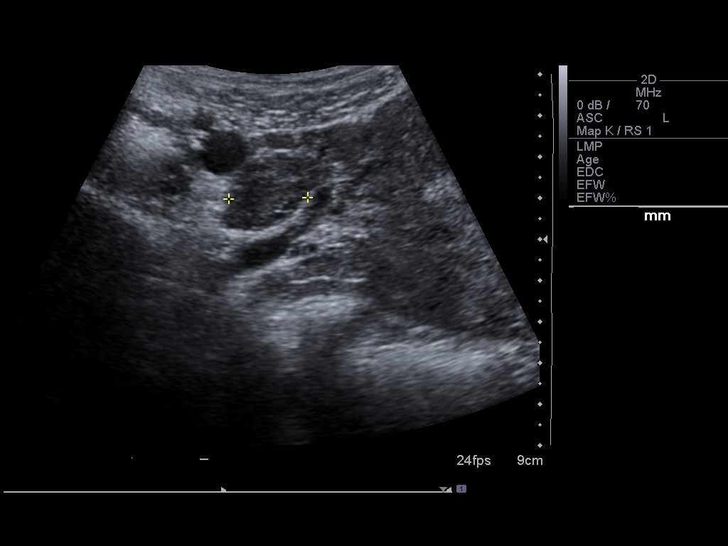
[im 17/63]
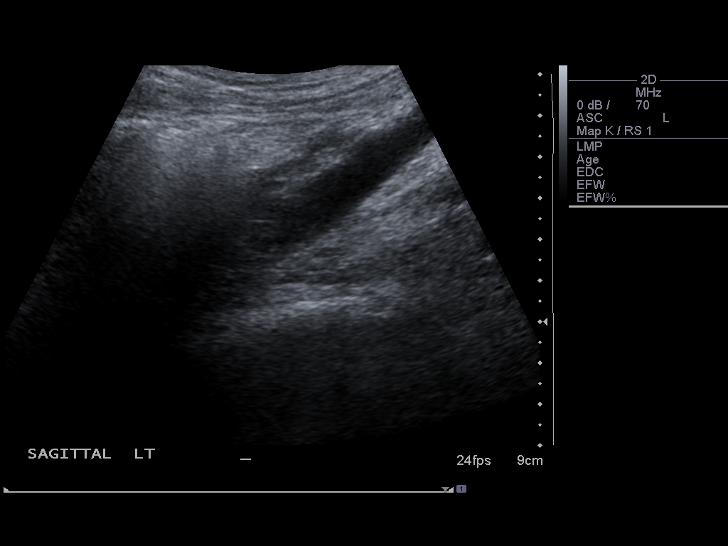
[im 21/63]
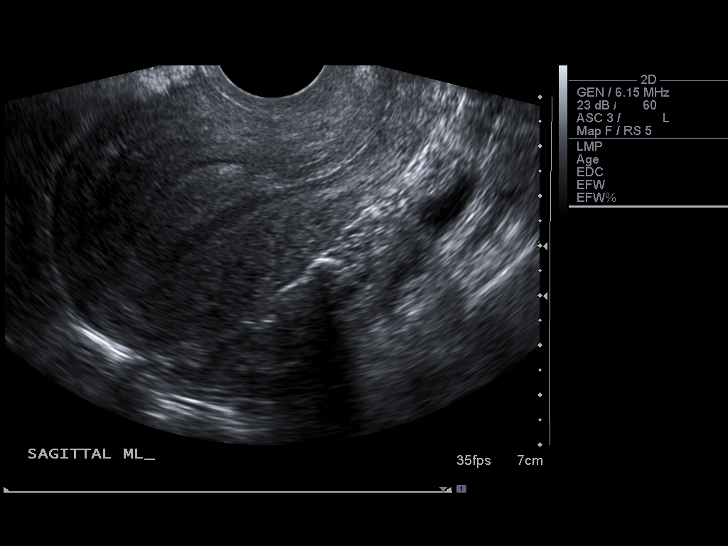
[im 26/63]
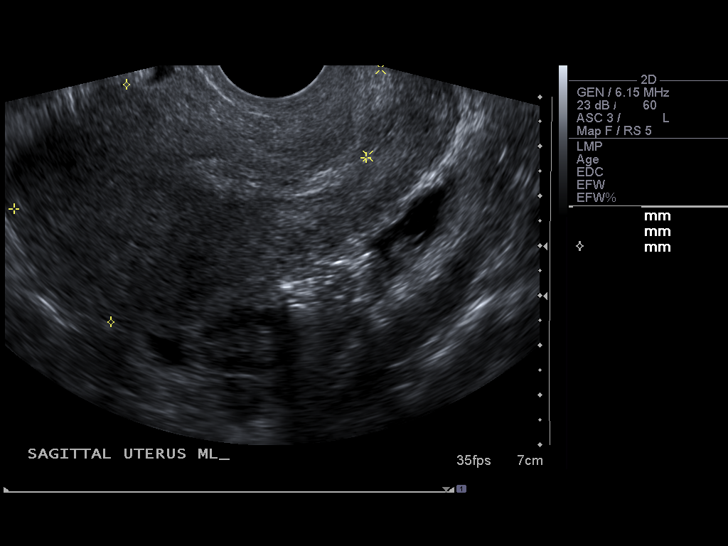
[im 33/63]
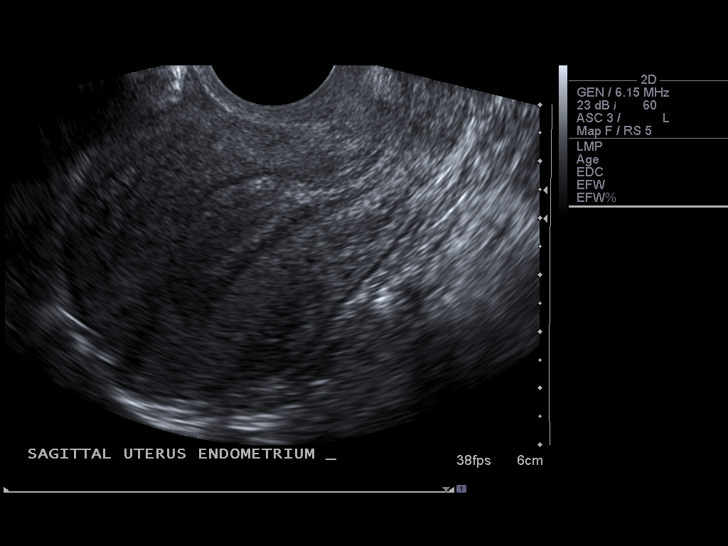
[im 37/63]
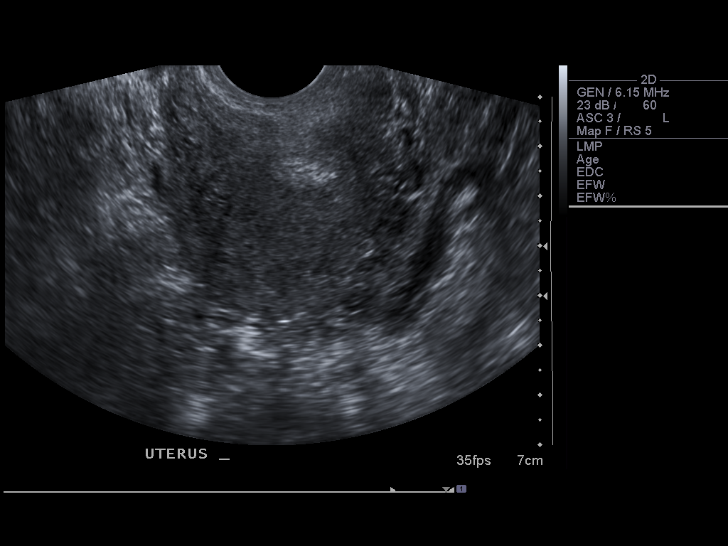
[im 42/63]
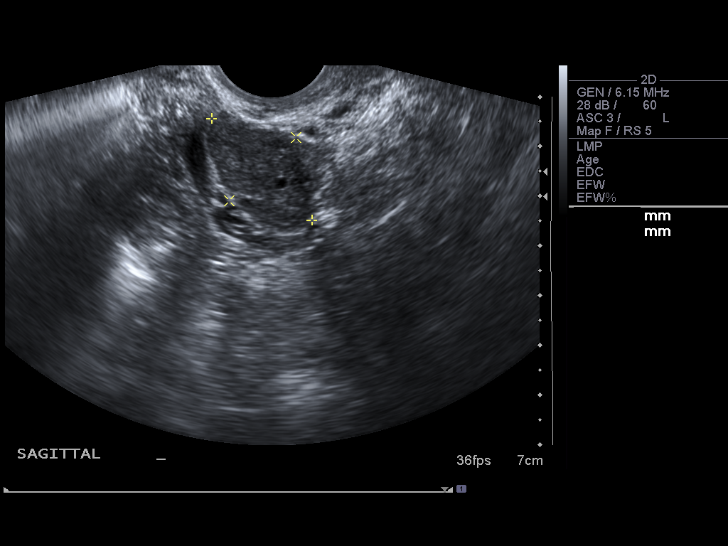
[im 46/63]
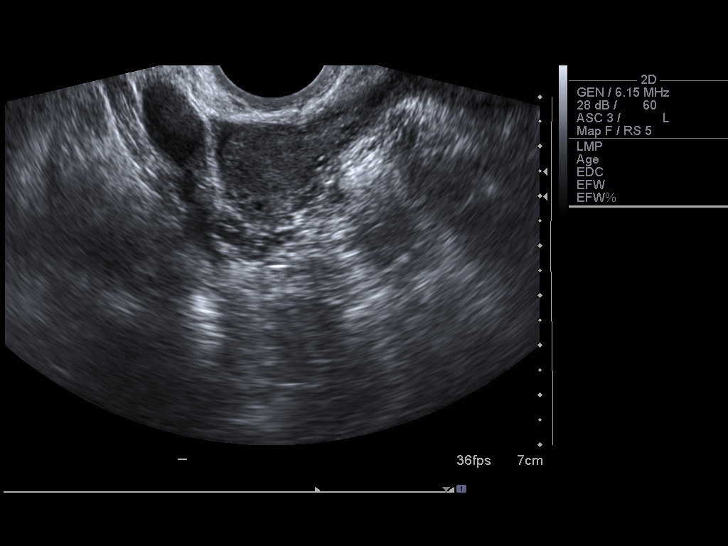
[im 51/63]
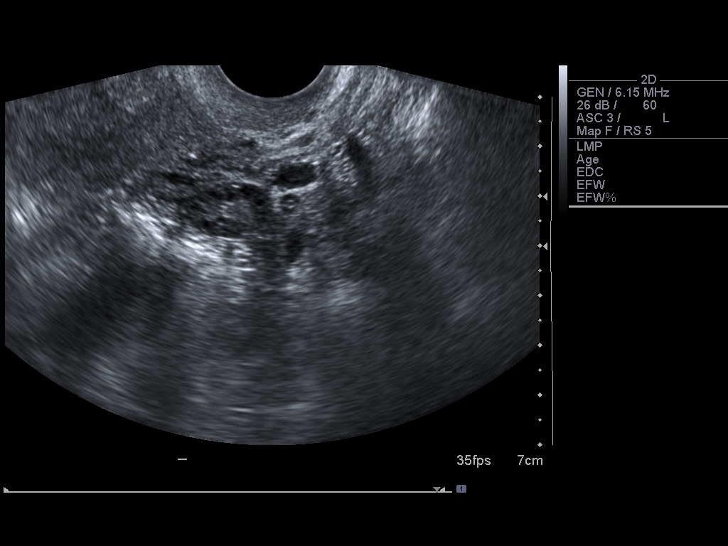
[im 56/63]
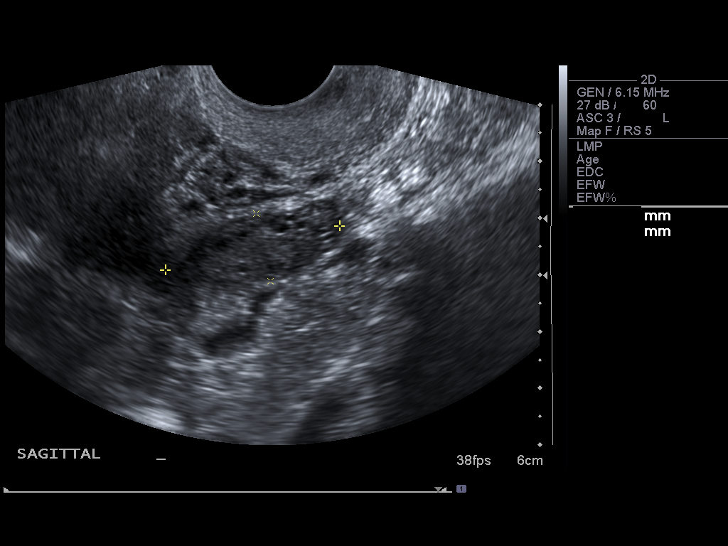
[im 60/63]
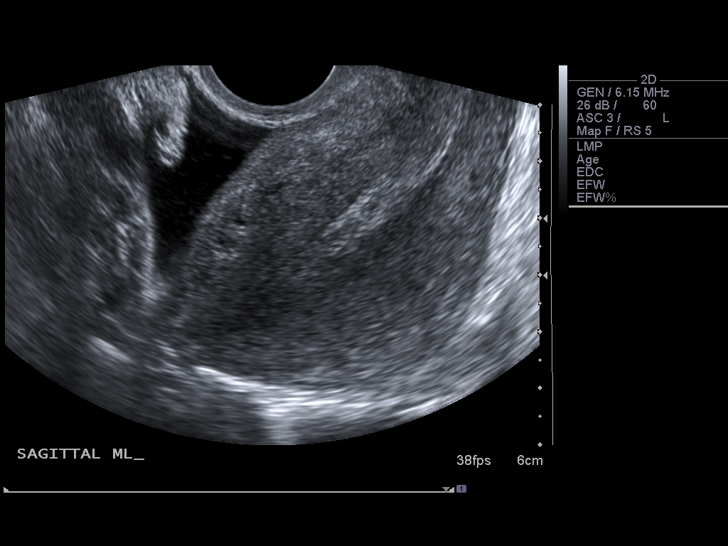

[13 of 28 positions shown; findings below may reference images not displayed]

Intrauterine gestational sac:  Not seen
Yolk sac: Not seen
Embryo: Not seen
Cardiac Activity: Not applicable
Heart Rate:  bpm

MSD:   mm      w     d
CRL:    mm     w     d        US EDC:

Maternal uterus/adnexae:
The uterus demonstrates a sagittal length of 7.9 cm, an AP width of
5.2 cm and a transverse width of 6.3 cm.  A homogeneous myometrium
is seen.

The endometrial lining is thickened with an AP width of 1.6 mm.
Color Doppler assessment was not performed and while no focal area
of increased echogenicity is seen to suggest retained products of
conception, lack of color flow  limits overall assessment.

Both ovaries have a normal appearance with the right ovary
measuring 2.9 x 1.9 by 2.3 cm and the left ovary measuring 3.2 x
1.2 x 2.0 cm.

A small amount of simple free fluid is noted superior to the
uterine fundus.  No separate adnexal masses are seen
IMPRESSION: Slightly thickened echogenic endometrial lining.  Color flow
assessment was not performed but no sonographic features suspicious
for retained products of conception are noted on the provided
images.  The endometrial appearance could be the result of
intraluminal blood or true thickening of the lining.  Given the low
quantitative beta HCG, this could represent an early
sonographically occult intrauterine gestation, nonvisualized
retained products of conception or abnormal nonprogressing
gestation.  No sonographic features suspicious for an ectopic
pregnancy are seen but this cannot be excluded completely.
Correlation with serial quantitative beta HCG is recommended with
follow-up evaluation as indicated.  With a bHCG level of 1577,
typically we would anticipate seeing evidence for an intrauterine
gestational sac.

## 2012-07-27 ENCOUNTER — Emergency Department (HOSPITAL_COMMUNITY)
Admission: EM | Admit: 2012-07-27 | Discharge: 2012-07-27 | Discharge status: Against Medical Advice | Payer: Medicaid Other | Attending: Emergency Medicine | Admitting: Emergency Medicine

## 2012-07-27 ENCOUNTER — Encounter (HOSPITAL_COMMUNITY): Payer: Self-pay | Admitting: Emergency Medicine

## 2012-07-27 DIAGNOSIS — Z5320 Procedure and treatment not carried out because of patient's decision for unspecified reasons: Secondary | ICD-10-CM | POA: Insufficient documentation

## 2012-07-27 NOTE — ED Notes (Signed)
Lip piercing has grown over in back and pt is unable to remove it. Has had piercing for approx 3 years and has been grown over in back for about 2 years. Unable to see back of piercing. No pain at this time but it does cause pain at times.

## 2012-07-31 ENCOUNTER — Emergency Department (HOSPITAL_COMMUNITY)
Admission: EM | Admit: 2012-07-31 | Discharge: 2012-07-31 | Disposition: A | Discharge status: Home / Self Care | Payer: Medicaid Other | Attending: Emergency Medicine | Admitting: Emergency Medicine

## 2012-07-31 ENCOUNTER — Encounter (HOSPITAL_COMMUNITY): Payer: Self-pay | Admitting: Emergency Medicine

## 2012-07-31 DIAGNOSIS — F17200 Nicotine dependence, unspecified, uncomplicated: Secondary | ICD-10-CM | POA: Insufficient documentation

## 2012-07-31 DIAGNOSIS — IMO0002 Reserved for concepts with insufficient information to code with codable children: Secondary | ICD-10-CM

## 2012-07-31 DIAGNOSIS — M795 Residual foreign body in soft tissue: Secondary | ICD-10-CM | POA: Insufficient documentation

## 2012-07-31 NOTE — ED Notes (Signed)
Pt reports persistent pain at site of piercing in r/upper lip. Skin overgrown

## 2012-07-31 NOTE — ED Provider Notes (Signed)
History  Scribed for Magnus Sinning, PA-C/ Raeford Razor, MD, the patient was seen in room WTR8/WTR8. This chart was scribed by Candelaria Stagers. The patient's care started at 5:17 PM   CSN: [id number]  Arrival date & time 07/31/12  1613   First MD Initiated Contact with Patient 07/31/12 1619      Chief Complaint  Patient presents with  . Facial Swelling    persistent facial swelling x 1 month     The history is provided by the patient. No language interpreter was used.   Sandra Anderson is a 21 y.o. female who presents to the Emergency Department complaining of intermittent throbbing pain at the site of a piercing just superior to the  right upper lip that was put in place three years ago.  She reports that she has also experienced swelling and draining from the piercing.  Pt requests for the piercing to be removed.  She has never removed the piercing since it was placed.  Nothing seems to make the sx better or worse.  She denies fever or chills.  No erythema surrounding the piercing.     Past Medical History  Diagnosis Date  . No pertinent past medical history     Past Surgical History  Procedure Date  . No past surgeries     Family History  Problem Relation Age of Onset  . Hypertension Mother     History  Substance Use Topics  . Smoking status: Current Every Day Smoker    Types: Cigarettes  . Smokeless tobacco: Never Used  . Alcohol Use: No    OB History    Grav Para Term Preterm Abortions TAB SAB Ect Mult Living   2 2 2  0      2      Review of Systems  Constitutional: Negative for fever and chills.  HENT:       Piercing in place to the right upper lip with tenderness and swelling.    Respiratory: Negative for shortness of breath.   Gastrointestinal: Negative for nausea and vomiting.  Neurological: Negative for weakness.    Allergies  Review of patient's allergies indicates no known allergies.  Home Medications  No current outpatient prescriptions on  file.  BP 107/64  Pulse 61  Temp 98.2 F (36.8 C) (Oral)  Resp 18  SpO2 100%  Breastfeeding? Unknown  Physical Exam  Nursing note and vitals reviewed. Constitutional: She is oriented to person, place, and time. She appears well-developed and well-nourished. No distress.  HENT:  Head: Normocephalic and atraumatic.  Mouth/Throat: Uvula is midline, oropharynx is clear and moist and mucous membranes are normal. No oropharyngeal exudate.       No erythema, no drainage, no induration, or swelling around the site of a piercing just superior to the right upper lip.  Piercing appears to be embedded in the oral mucosa.  Unable to visualize the back of the piercing in the mouth.      Eyes: EOM are normal. Pupils are equal, round, and reactive to light.  Neck: Neck supple. No tracheal deviation present.  Cardiovascular: Normal rate and regular rhythm.   Pulmonary/Chest: Effort normal. No respiratory distress. She has no wheezes. She has no rales.  Musculoskeletal: Normal range of motion. She exhibits no edema and no tenderness.  Neurological: She is alert and oriented to person, place, and time.  Skin: Skin is warm and dry. No rash noted. She is not diaphoretic.  Psychiatric: She has a normal mood and  affect. Her behavior is normal.    ED Course  Procedures   DIAGNOSTIC STUDIES: Oxygen Saturation is 100% on room air, normal by my interpretation.    COORDINATION OF CARE:  6:00PM Removal of piercing performed by Magnus Sinning, PA-C.  Area was cleaned and lidocaine used before an incision was made to expose the embedded portion.  Pt tolerated procedure well.     Labs Reviewed - No data to display No results found.   No diagnosis found.    MDM  Patient presents with a piercing just above the upper lip that she would like removed.  Piercing embedded in the oral mucosa.  Small incision of the oral mucosa was made and piercing was removed without complication.    I personally  performed the services described in this documentation, which was scribed in my presence. The recorded information has been reviewed and is accurate.        Pascal Lux Hanover, PA-C 08/01/12 1348

## 2012-08-05 NOTE — ED Provider Notes (Signed)
Medical screening examination/treatment/procedure(s) were performed by non-physician practitioner and as supervising physician I was immediately available for consultation/collaboration.  Nastasha Reising, MD 08/05/12 0040 

## 2013-06-02 ENCOUNTER — Encounter (HOSPITAL_COMMUNITY): Payer: Self-pay | Admitting: Emergency Medicine

## 2013-06-02 ENCOUNTER — Emergency Department (INDEPENDENT_AMBULATORY_CARE_PROVIDER_SITE_OTHER)
Admission: EM | Admit: 2013-06-02 | Discharge: 2013-06-02 | Disposition: A | Discharge status: Home / Self Care | Payer: Medicaid Other | Source: Home / Self Care | Attending: Family Medicine | Admitting: Family Medicine

## 2013-06-02 DIAGNOSIS — J069 Acute upper respiratory infection, unspecified: Secondary | ICD-10-CM

## 2013-06-02 MED ORDER — DEXTROMETHORPHAN POLISTIREX 30 MG/5ML PO LQCR: 60.0000 mg | Freq: Two times a day (BID) | ORAL | Status: DC

## 2013-06-02 MED ORDER — IPRATROPIUM BROMIDE 0.06 % NA SOLN: 2.0000 | Freq: Four times a day (QID) | NASAL | Status: DC

## 2013-06-02 NOTE — ED Notes (Signed)
22 yr old is here today with complaints of Nasal-clear; cough-green, dizziness, chest congestion, HA, fever x 3 dys. She states she took  NyQuil but is not feeling any better.  LMP: 05/13/13 No other complaints

## 2013-06-02 NOTE — ED Provider Notes (Signed)
CSN: [id number]     Arrival date & time 06/02/13  1352 History   First MD Initiated Contact with Patient 06/02/13 1402     Chief Complaint  Patient presents with  . URI   (Consider location/radiation/quality/duration/timing/severity/associated sxs/prior Treatment) Patient is a 22 y.o. female presenting with URI. The history is provided by the patient.  URI Presenting symptoms: congestion, cough, fever and rhinorrhea   Severity:  Mild Duration:  3 days Progression:  Unchanged Chronicity:  New Associated symptoms: no myalgias, no sinus pain, no swollen glands and no wheezing   Risk factors: sick contacts   Risk factors: no recent illness     Past Medical History  Diagnosis Date  . No pertinent past medical history    Past Surgical History  Procedure Laterality Date  . No past surgeries     Family History  Problem Relation Age of Onset  . Hypertension Mother    History  Substance Use Topics  . Smoking status: Current Every Day Smoker    Types: Cigarettes  . Smokeless tobacco: Never Used  . Alcohol Use: No   OB History   Grav Para Term Preterm Abortions TAB SAB Ect Mult Living   2 2 2  0      2     Review of Systems  Constitutional: Positive for fever. Negative for chills.  HENT: Positive for congestion and rhinorrhea.   Respiratory: Positive for cough. Negative for wheezing.   Gastrointestinal: Negative.   Genitourinary: Negative.   Musculoskeletal: Negative for myalgias.    Allergies  Review of patient's allergies indicates no known allergies.  Home Medications  No current outpatient prescriptions on file. BP 117/84  Pulse 79  Temp(Src) 98 F (36.7 C) (Oral)  Resp 18  SpO2 99%  LMP 05/13/2013  Breastfeeding? No Physical Exam  Nursing note and vitals reviewed. Constitutional: She is oriented to person, place, and time. She appears well-developed and well-nourished. No distress.  HENT:  Head: Normocephalic.  Right Ear: External ear normal.  Left Ear:  External ear normal.  Mouth/Throat: Oropharynx is clear and moist.  Eyes: Pupils are equal, round, and reactive to light.  Neck: Normal range of motion. Neck supple.  Cardiovascular: Regular rhythm.   Pulmonary/Chest: Breath sounds normal.  Abdominal: Soft. Bowel sounds are normal. There is no tenderness.  Lymphadenopathy:    She has no cervical adenopathy.  Neurological: She is alert and oriented to person, place, and time.  Skin: Skin is warm and dry.    ED Course  Procedures (including critical care time) Labs Review Labs Reviewed - No data to display Imaging Review No results found.  EKG Interpretation     Ventricular Rate:    PR Interval:    QRS Duration:   QT Interval:    QTC Calculation:   R Axis:     Text Interpretation:              MDM      Linna Hoff, MD 06/02/13 1455

## 2013-08-05 ENCOUNTER — Encounter (HOSPITAL_COMMUNITY): Payer: Self-pay | Admitting: Emergency Medicine

## 2013-08-05 ENCOUNTER — Emergency Department (HOSPITAL_COMMUNITY): Payer: No Typology Code available for payment source

## 2013-08-05 ENCOUNTER — Emergency Department (HOSPITAL_COMMUNITY)
Admission: EM | Admit: 2013-08-05 | Discharge: 2013-08-06 | Disposition: A | Discharge status: Home / Self Care | Payer: No Typology Code available for payment source | Attending: Emergency Medicine | Admitting: Emergency Medicine

## 2013-08-05 DIAGNOSIS — S0083XA Contusion of other part of head, initial encounter: Secondary | ICD-10-CM

## 2013-08-05 DIAGNOSIS — T148XXA Other injury of unspecified body region, initial encounter: Secondary | ICD-10-CM

## 2013-08-05 DIAGNOSIS — S0990XA Unspecified injury of head, initial encounter: Secondary | ICD-10-CM | POA: Insufficient documentation

## 2013-08-05 DIAGNOSIS — S1093XA Contusion of unspecified part of neck, initial encounter: Principal | ICD-10-CM

## 2013-08-05 DIAGNOSIS — M25519 Pain in unspecified shoulder: Secondary | ICD-10-CM

## 2013-08-05 DIAGNOSIS — Y92410 Unspecified street and highway as the place of occurrence of the external cause: Secondary | ICD-10-CM | POA: Insufficient documentation

## 2013-08-05 DIAGNOSIS — S4980XA Other specified injuries of shoulder and upper arm, unspecified arm, initial encounter: Secondary | ICD-10-CM | POA: Insufficient documentation

## 2013-08-05 DIAGNOSIS — F17200 Nicotine dependence, unspecified, uncomplicated: Secondary | ICD-10-CM | POA: Insufficient documentation

## 2013-08-05 DIAGNOSIS — Y9389 Activity, other specified: Secondary | ICD-10-CM | POA: Insufficient documentation

## 2013-08-05 DIAGNOSIS — S46909A Unspecified injury of unspecified muscle, fascia and tendon at shoulder and upper arm level, unspecified arm, initial encounter: Secondary | ICD-10-CM | POA: Insufficient documentation

## 2013-08-05 DIAGNOSIS — Z79899 Other long term (current) drug therapy: Secondary | ICD-10-CM | POA: Insufficient documentation

## 2013-08-05 DIAGNOSIS — S0003XA Contusion of scalp, initial encounter: Secondary | ICD-10-CM | POA: Insufficient documentation

## 2013-08-05 DIAGNOSIS — IMO0002 Reserved for concepts with insufficient information to code with codable children: Secondary | ICD-10-CM | POA: Insufficient documentation

## 2013-08-05 MED ORDER — IBUPROFEN 800 MG PO TABS: 800.0000 mg | ORAL_TABLET | Freq: Three times a day (TID) | ORAL | Status: DC

## 2013-08-05 MED ORDER — CYCLOBENZAPRINE HCL 10 MG PO TABS: 10.0000 mg | ORAL_TABLET | Freq: Three times a day (TID) | ORAL | Status: DC | PRN

## 2013-08-05 MED ORDER — IBUPROFEN 800 MG PO TABS
800.0000 mg | ORAL_TABLET | Freq: Once | ORAL | Status: AC
  Administered 2013-08-05: 800 mg via ORAL
  Filled 2013-08-05: qty 1
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
## 2013-08-05 NOTE — ED Notes (Signed)
Bed: WTR5 Expected date:  Expected time:  Means of arrival:  Comments: EMS 

## 2013-08-05 NOTE — ED Notes (Signed)
Pt has some mild swelling and redness to her right eye; also c/o left shoulder pain
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
## 2013-08-06 NOTE — ED Provider Notes (Signed)
Medical screening examination/treatment/procedure(s) were performed by non-physician practitioner and as supervising physician I was immediately available for consultation/collaboration.  EKG Interpretation   None       Devoria AlbeIva Keyanna Sandefer, MD, Armando GangFACEP   Ward GivensIva L Kavan Devan, MD 08/06/13 [phone]
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
## 2014-05-26 ENCOUNTER — Encounter (HOSPITAL_COMMUNITY): Payer: Self-pay | Admitting: Emergency Medicine

## 2014-07-22 ENCOUNTER — Encounter (HOSPITAL_COMMUNITY): Payer: Self-pay | Admitting: Emergency Medicine

## 2014-07-22 ENCOUNTER — Emergency Department (INDEPENDENT_AMBULATORY_CARE_PROVIDER_SITE_OTHER)
Admission: EM | Admit: 2014-07-22 | Discharge: 2014-07-22 | Disposition: A | Discharge status: Home / Self Care | Payer: Medicaid Other | Source: Home / Self Care | Attending: Family Medicine | Admitting: Family Medicine

## 2014-07-22 DIAGNOSIS — M25532 Pain in left wrist: Secondary | ICD-10-CM

## 2014-07-22 NOTE — Discharge Instructions (Signed)
Your discomfort appears to be unrelated to your recent plasma donation. Please use tylenol or ibuprofen as directed on packaging for your discomfort. It is fine for you to return to donating. If symptoms persist, please follow up with the primary care doctor of your choice. If donation increases your discomfort, stop donating plasma.  Wrist Pain Wrist injuries are frequent in adults and children. A sprain is an injury to the ligaments that hold your bones together. A strain is an injury to muscle or muscle cord-like structures (tendons) from stretching or pulling. Generally, when wrists are moderately tender to touch following a fall or injury, a break in the bone (fracture) may be present. Most wrist sprains or strains are better in 3 to 5 days, but complete healing may take several weeks. HOME CARE INSTRUCTIONS   Put ice on the injured area.  Put ice in a plastic bag.  Place a towel between your skin and the bag.  Leave the ice on for 15-20 minutes, 3-4 times a day, for the first 2 days, or as directed by your health care provider.  Keep your arm raised above the level of your heart whenever possible to reduce swelling and pain.  Rest the injured area for at least 48 hours or as directed by your health care provider.  If a splint or elastic bandage has been applied, use it for as long as directed by your health care provider or until seen by a health care provider for a follow-up exam.  Only take over-the-counter or prescription medicines for pain, discomfort, or fever as directed by your health care provider.  Keep all follow-up appointments. You may need to follow up with a specialist or have follow-up X-rays. Improvement in pain level is not a guarantee that you did not fracture a bone in your wrist. The only way to determine whether or not you have a broken bone is by X-ray. SEEK IMMEDIATE MEDICAL CARE IF:   Your fingers are swollen, very red, white, or cold and blue.  Your fingers  are numb or tingling.  You have increasing pain.  You have difficulty moving your fingers. MAKE SURE YOU:   Understand these instructions.  Will watch your condition.  Will get help right away if you are not doing well or get worse. Document Released: 04/20/2005 Document Revised: 07/16/2013 Document Reviewed: 09/01/2010 [facility] Patient Information 2015 [place], [place]. This information is not intended to replace advice given to you by your health care provider. Make sure you discuss any questions you have with your health care provider.

## 2014-07-22 NOTE — ED Notes (Signed)
Pain in left wrist, no known injury.  Patient concerned this pain is related to giving plasma last week. Has complained to plasma donation location, but now needs a note stating it is ok to donate plasma

## 2014-07-22 NOTE — ED Provider Notes (Signed)
CSN: [id number]     Arrival date & time 07/22/14  1021 History   First MD Initiated Contact with Patient 07/22/14 1031     No chief complaint on file.  (Consider location/radiation/quality/duration/timing/severity/associated sxs/prior Treatment) HPI Comments: [place] on [employer] [place] [employer]. States she donated plasma for the first time (left AC fossa is donation site) about two weeks ago and then noticed at some point later that her left wrist was bothering her. Is here stating that plasma center is requiring her to be evaluated before she can return to donating because she went to plasma center to complain about discomfort. No meds taken for discomfort Right hand dominant.   The history is provided by the patient.    Past Medical History  Diagnosis Date  . No pertinent past medical history    Past Surgical History  Procedure Laterality Date  . No past surgeries     Family History  Problem Relation Age of Onset  . Hypertension Mother    History  Substance Use Topics  . Smoking status: Current Every Day Smoker    Types: Cigarettes  . Smokeless tobacco: Never Used  . Alcohol Use: No   OB History    Gravida Para Term Preterm AB TAB SAB Ectopic Multiple Living   2 2 2  0      2     Review of Systems  All other systems reviewed and are negative.   Allergies  Review of patient's allergies indicates no known allergies.  Home Medications   Prior to Admission medications   Medication Sig Start Date End Date Taking? Authorizing Provider  ARIPiprazole (ABILIFY) 5 MG tablet Take 7.5 mg by mouth daily.    Historical Provider, MD  cyclobenzaprine (FLEXERIL) 10 MG tablet Take 1 tablet (10 mg total) by mouth 3 (three) times daily as needed for muscle spasms. 08/05/13   Phill MutterPeter S Dammen, PA-C  ibuprofen (ADVIL,MOTRIN) 800 MG tablet Take 1 tablet (800 mg total) by mouth 3 (three) times daily. 08/05/13   Phill MutterPeter S Dammen, PA-C   BP 114/76 mmHg  Pulse 67  Temp(Src) 98.1 F (36.7 C)  (Oral)  Resp 20  SpO2 98% Physical Exam  Constitutional: She is oriented to person, place, and time. She appears well-developed and well-nourished.  Cardiovascular: Normal rate.   Pulmonary/Chest: Effort normal.  Musculoskeletal:       Left elbow: Normal.       Left wrist: Normal.  Donation site without issue Reports some discomfort with ROM of left wrist but unable to reproduce discomfort during exam.   Neurological: She is alert and oriented to person, place, and time.  Skin: Skin is warm and dry. No rash noted. No erythema.  Psychiatric: She has a normal mood and affect. Her behavior is normal.  Nursing note and vitals reviewed.   ED Course  Procedures (including critical care time) Labs Review Labs Reviewed - No data to display  Imaging Review No results found.   MDM   1. Left wrist pain   Your discomfort appears to be unrelated to your recent plasma donation. Likely related to repetitive use at work.  Please use tylenol or ibuprofen as directed on packaging for your discomfort. It is fine for you to return to donating. If symptoms persist, please follow up with the primary care doctor of your choice. If donation increases your discomfort, stop donating plasma.   Ria ClockJennifer Lee H Tirth [place], GeorgiaPA 07/22/14 [phone]

## 2014-07-25 NOTE — L&D Delivery Note (Signed)
Delivery Note This is a 24 year old G 3 P3 who was admitted for Not in labor. and elective IOL at term. She progressed with cytotec, foley bulb and epidural to the second stage of labor.  She pushed for 3 min.  At 6:14 PM a viable female was delivered via Vaginal, Spontaneous Delivery (Presentation: ; Occiput Anterior).  APGAR: 9, 9; weight 7 lb 7.8 oz (3395 g).  A nuchal cord  Identified was not.  Infant was placed on maternal abdomen.  Delayed cord clamping about 5 minutes was performed.  Cord double clamped and cut.  Apgar scores were 9 and 9. The placenta delivered spontaneously, shultz, with a 3 vessel cord.  Inspection revealed no lacerations,intact perineum. The uterus was firm bleeding stable.  EBL was 247.    Placenta and umbilical artery blood gas were not sent.  There were no complications during the procedure.  Mom and baby skin to skin following delivery. Left in stable condition.   Placenta status: Intact, Spontaneous.  Cord: 3 vessels with the following complications: None.  Cord pH: N/A  Anesthesia: Epidural  Episiotomy: None Lacerations:  None  Suture Repair: none Est. Blood Loss (mL): 247  Mom to postpartum.  Baby to Couplet care / Skin to Skin.  Rachelle A Denney 03/19/2015, 8:29 PM

## 2014-08-01 ENCOUNTER — Inpatient Hospital Stay (HOSPITAL_COMMUNITY)
Admission: AD | Admit: 2014-08-01 | Discharge: 2014-08-01 | Disposition: A | Discharge status: Home / Self Care | Payer: Medicaid Other | Source: Ambulatory Visit | Attending: Obstetrics & Gynecology | Admitting: Obstetrics & Gynecology

## 2014-08-01 ENCOUNTER — Encounter (HOSPITAL_COMMUNITY): Payer: Self-pay | Admitting: *Deleted

## 2014-08-01 DIAGNOSIS — Z3201 Encounter for pregnancy test, result positive: Secondary | ICD-10-CM | POA: Insufficient documentation

## 2014-08-01 DIAGNOSIS — Z3200 Encounter for pregnancy test, result unknown: Secondary | ICD-10-CM | POA: Diagnosis present

## 2014-08-01 DIAGNOSIS — O219 Vomiting of pregnancy, unspecified: Secondary | ICD-10-CM

## 2014-08-01 LAB — POCT PREGNANCY, URINE: PREG TEST UR: POSITIVE — AB

## 2014-08-01 MED ORDER — PRENATAL VITAMINS 28-0.8 MG PO TABS: 1.0000 | ORAL_TABLET | Freq: Every day | ORAL | Status: DC

## 2014-08-01 MED ORDER — PROMETHAZINE HCL 25 MG PO TABS: 12.5000 mg | ORAL_TABLET | Freq: Four times a day (QID) | ORAL | Status: DC | PRN

## 2014-08-01 NOTE — MAU Provider Note (Signed)
S: ,24 y.o. [id number] @[redacted]w[redacted]d  by unsure LMP presents to MAU for pregnancy verification. She reports positive pregnancy test x 2 at home.  She denies abdominal pain or bleeding. She is having daily nausea.  Pt went to [facility] with previous pregnancy and plans to seek prenatal care with the practice this pregnancy.  O: BP 147/79 mmHg  Pulse 84  Temp(Src) 98.7 F (37.1 C)  Resp 18  LMP 06/11/2014  Physical Examination: General appearance - alert, well appearing, and in no distress, oriented to person, place, and time and acyanotic, in no respiratory distress  Results for orders placed or performed during the hospital encounter of 08/01/14 (from the past 48 hour(s))  Pregnancy, urine POC     Status: Abnormal   Collection Time: 08/01/14  8:53 AM  Result Value Ref Range   Preg Test, Ur POSITIVE (A) NEGATIVE    Comment:        THE SENSITIVITY OF THIS METHODOLOGY IS >24 mIU/mL     A: Positive pregnancy test N/V of pregnancy  P: D/C home Pregnancy verification letter given Discussed Unisom/B6 for nausea Phenergan 12.5-25 mg PO Q 6 hours Rx to pharmacy PNV Rx F/U with early prenatal care  Sharen CounterLisa Leftwich-Kirby Certified Nurse-Midwife

## 2014-08-01 NOTE — MAU Note (Signed)
Pt presents to MAU for pregnancy test. Denies any pain or vaginal bleeding

## 2014-08-01 NOTE — Discharge Instructions (Signed)
Nausea medication to take during pregnancy:   Unisom (doxylamine succinate 25 mg tablets) Take one tablet daily at bedtime. If symptoms are not adequately controlled, the dose can be increased to a maximum recommended dose of two tablets daily (1/2 tablet in the morning, 1/2 tablet mid-afternoon and one at bedtime).  Vitamin B6  tablets. Take one tablet twice a day (up to 200 mg per day).  Add Phenergan as prescribed to take as needed.   Morning Sickness Morning sickness is when you feel sick to your stomach (nauseous) during pregnancy. This nauseous feeling may or may not come with vomiting. It often occurs in the morning but can be a problem any time of day. Morning sickness is most common during the first trimester, but it may continue throughout pregnancy. While morning sickness is unpleasant, it is usually harmless unless you develop severe and continual vomiting (hyperemesis gravidarum). This condition requires more intense treatment.  CAUSES  The cause of morning sickness is not completely known but seems to be related to normal hormonal changes that occur in pregnancy. RISK FACTORS You are at greater risk if you:  Experienced nausea or vomiting before your pregnancy.  Had morning sickness during a previous pregnancy.  Are pregnant with more than one baby, such as twins. TREATMENT  Do not use any medicines (prescription, over-the-counter, or herbal) for morning sickness without first talking to your health care provider. Your health care provider may prescribe or recommend:  Vitamin B6 supplements.  Anti-nausea medicines.  The herbal medicine ginger. HOME CARE INSTRUCTIONS   Only take over-the-counter or prescription medicines as directed by your health care provider.  Taking multivitamins before getting pregnant can prevent or decrease the severity of morning sickness in most women.  Eat a piece of dry toast or unsalted crackers before getting out of bed in the  morning.  Eat five or six small meals a day.  Eat dry and bland foods (rice, baked potato). Foods high in carbohydrates are often helpful.  Do not drink liquids with your meals. Drink liquids between meals.  Avoid greasy, fatty, and spicy foods.  Get someone to cook for you if the smell of any food causes nausea and vomiting.  If you feel nauseous after taking prenatal vitamins, take the vitamins at night or with a snack.  Snack on protein foods (nuts, yogurt, cheese) between meals if you are hungry.  Eat unsweetened gelatins for desserts.  Wearing an acupressure wristband (worn for sea sickness) may be helpful.  Acupuncture may be helpful.  Do not smoke.  Get a humidifier to keep the air in your house free of odors.  Get plenty of fresh air. SEEK MEDICAL CARE IF:   Your home remedies are not working, and you need medicine.  You feel dizzy or lightheaded.  You are losing weight. SEEK IMMEDIATE MEDICAL CARE IF:   You have persistent and uncontrolled nausea and vomiting.  You pass out (faint). MAKE SURE YOU:  Understand these instructions.  Will watch your condition.  Will get help right away if you are not doing well or get worse. Document Released: 09/01/2006 Document Revised: 07/16/2013 Document Reviewed: 12/26/2012 [facility] Patient Information 2015 [place], [place]. This information is not intended to replace advice given to you by your health care provider. Make sure you discuss any questions you have with your health care provider. Prenatal Care [facility] OB[facility]    [facility]  [phone]     Phone: [phone]  [facility]                      [facility]  @[place]     Phone: [phone]  Phone: [phone]         [facility]     Phone: [phone]  Phone: [phone]           [facility] @  [place]                hone: [phone]  Phone: [phone]         [facility] Dr[address] Francoise CeoBernard Marshall      Phone: [phone]  Phone: [phone]         [facility]                Phone: [phone]  [facility]   [address]Phone:[phone]    [facility] [place])          Phone: [phone] [facility]   Phone: [phone]

## 2014-08-09 ENCOUNTER — Inpatient Hospital Stay (HOSPITAL_COMMUNITY)
Admission: AD | Admit: 2014-08-09 | Discharge: 2014-08-09 | Disposition: A | Discharge status: Home / Self Care | Payer: Medicaid Other | Source: Ambulatory Visit | Attending: Obstetrics & Gynecology | Admitting: Obstetrics & Gynecology

## 2014-08-09 ENCOUNTER — Inpatient Hospital Stay (HOSPITAL_COMMUNITY): Payer: Medicaid Other

## 2014-08-09 ENCOUNTER — Encounter (HOSPITAL_COMMUNITY): Payer: Self-pay | Admitting: *Deleted

## 2014-08-09 DIAGNOSIS — N760 Acute vaginitis: Secondary | ICD-10-CM | POA: Insufficient documentation

## 2014-08-09 DIAGNOSIS — O26899 Other specified pregnancy related conditions, unspecified trimester: Secondary | ICD-10-CM

## 2014-08-09 DIAGNOSIS — R52 Pain, unspecified: Secondary | ICD-10-CM

## 2014-08-09 DIAGNOSIS — B9689 Other specified bacterial agents as the cause of diseases classified elsewhere: Secondary | ICD-10-CM | POA: Insufficient documentation

## 2014-08-09 DIAGNOSIS — Z3A08 8 weeks gestation of pregnancy: Secondary | ICD-10-CM | POA: Insufficient documentation

## 2014-08-09 DIAGNOSIS — O210 Mild hyperemesis gravidarum: Secondary | ICD-10-CM | POA: Diagnosis not present

## 2014-08-09 DIAGNOSIS — O23591 Infection of other part of genital tract in pregnancy, first trimester: Secondary | ICD-10-CM | POA: Insufficient documentation

## 2014-08-09 DIAGNOSIS — F17210 Nicotine dependence, cigarettes, uncomplicated: Secondary | ICD-10-CM | POA: Diagnosis not present

## 2014-08-09 DIAGNOSIS — R109 Unspecified abdominal pain: Secondary | ICD-10-CM | POA: Diagnosis present

## 2014-08-09 DIAGNOSIS — R110 Nausea: Secondary | ICD-10-CM

## 2014-08-09 DIAGNOSIS — O9989 Other specified diseases and conditions complicating pregnancy, childbirth and the puerperium: Secondary | ICD-10-CM

## 2014-08-09 DIAGNOSIS — O99331 Smoking (tobacco) complicating pregnancy, first trimester: Secondary | ICD-10-CM | POA: Diagnosis not present

## 2014-08-09 LAB — CBC
HCT: 39.2 % (ref 36.0–46.0)
Hemoglobin: 13.1 g/dL (ref 12.0–15.0)
MCH: 27.5 pg (ref 26.0–34.0)
MCHC: 33.4 g/dL (ref 30.0–36.0)
MCV: 82.4 fL (ref 78.0–100.0)
Platelets: 289 10*3/uL (ref 150–400)
RBC: 4.76 MIL/uL (ref 3.87–5.11)
RDW: 13.2 % (ref 11.5–15.5)
WBC: 7.5 10*3/uL (ref 4.0–10.5)

## 2014-08-09 LAB — URINALYSIS, ROUTINE W REFLEX MICROSCOPIC
Bilirubin Urine: NEGATIVE
Glucose, UA: NEGATIVE mg/dL
Hgb urine dipstick: NEGATIVE
KETONES UR: NEGATIVE mg/dL
NITRITE: NEGATIVE
PH: 6 (ref 5.0–8.0)
PROTEIN: NEGATIVE mg/dL
SPECIFIC GRAVITY, URINE: 1.01 (ref 1.005–1.030)
UROBILINOGEN UA: 1 mg/dL (ref 0.0–1.0)

## 2014-08-09 LAB — WET PREP, GENITAL: TRICH WET PREP: NONE SEEN

## 2014-08-09 LAB — HCG, QUANTITATIVE, PREGNANCY: hCG, Beta Chain, Quant, S: 188426 m[IU]/mL — ABNORMAL HIGH (ref <5)

## 2014-08-09 LAB — URINE MICROSCOPIC-ADD ON

## 2014-08-09 LAB — OB RESULTS CONSOLE GC/CHLAMYDIA: GC PROBE AMP, GENITAL: NEGATIVE

## 2014-08-09 MED ORDER — METOCLOPRAMIDE HCL 10 MG PO TABS: 10.0000 mg | ORAL_TABLET | Freq: Four times a day (QID) | ORAL | Status: DC

## 2014-08-09 MED ORDER — METOCLOPRAMIDE HCL 10 MG PO TABS
10.0000 mg | ORAL_TABLET | Freq: Once | ORAL | Status: AC
  Administered 2014-08-09: 10 mg via ORAL
  Filled 2014-08-09: qty 1

## 2014-08-09 MED ORDER — ACETAMINOPHEN 325 MG PO TABS
650.0000 mg | ORAL_TABLET | Freq: Once | ORAL | Status: AC
  Administered 2014-08-09: 650 mg via ORAL
  Filled 2014-08-09: qty 2

## 2014-08-09 MED ORDER — METRONIDAZOLE 500 MG PO TABS: 500.0000 mg | ORAL_TABLET | Freq: Two times a day (BID) | ORAL | Status: DC

## 2014-08-09 NOTE — MAU Note (Signed)
Having abd pain all over stomach for couple wks. Crampy like pain which causes nause. Have vomited once over this time, just nauseated. No diarrhea. No bleeding

## 2014-08-09 NOTE — Discharge Instructions (Signed)
Abdominal Pain During Pregnancy Abdominal pain is common in pregnancy. Most of the time, it does not cause harm. There are many causes of abdominal pain. Some causes are more serious than others. Some of the causes of abdominal pain in pregnancy are easily diagnosed. Occasionally, the diagnosis takes time to understand. Other times, the cause is not determined. Abdominal pain can be a sign that something is very wrong with the pregnancy, or the pain may have nothing to do with the pregnancy at all. For this reason, always tell your health care provider if you have any abdominal discomfort. HOME CARE INSTRUCTIONS  Monitor your abdominal pain for any changes. The following actions may help to alleviate any discomfort you are experiencing:  Do not have sexual intercourse or put anything in your vagina until your symptoms go away completely.  Get plenty of rest until your pain improves.  Drink clear fluids if you feel nauseous. Avoid solid food as long as you are uncomfortable or nauseous.  Only take over-the-counter or prescription medicine as directed by your health care provider.  Keep all follow-up appointments with your health care provider. SEEK IMMEDIATE MEDICAL CARE IF:  You are bleeding, leaking fluid, or passing tissue from the vagina.  You have increasing pain or cramping.  You have persistent vomiting.  You have painful or bloody urination.  You have a fever.  You notice a decrease in your baby's movements.  You have extreme weakness or feel faint.  You have shortness of breath, with or without abdominal pain.  You develop a severe headache with abdominal pain.  You have abnormal vaginal discharge with abdominal pain.  You have persistent diarrhea.  You have abdominal pain that continues even after rest, or gets worse. MAKE SURE YOU:   Understand these instructions.  Will watch your condition.  Will get help right away if you are not doing well or get  worse. Document Released: 07/11/2005 Document Revised: 05/01/2013 Document Reviewed: 02/07/2013 [facility] Patient Information 2015 [place], [place]. This information is not intended to replace advice given to you by your health care provider. Make sure you discuss any questions you have with your health care provider. Morning Sickness Morning sickness is when you feel sick to your stomach (nauseous) during pregnancy. This nauseous feeling may or may not come with vomiting. It often occurs in the morning but can be a problem any time of day. Morning sickness is most common during the first trimester, but it may continue throughout pregnancy. While morning sickness is unpleasant, it is usually harmless unless you develop severe and continual vomiting (hyperemesis gravidarum). This condition requires more intense treatment.  CAUSES  The cause of morning sickness is not completely known but seems to be related to normal hormonal changes that occur in pregnancy. RISK FACTORS You are at greater risk if you:  Experienced nausea or vomiting before your pregnancy.  Had morning sickness during a previous pregnancy.  Are pregnant with more than one baby, such as twins. TREATMENT  Do not use any medicines (prescription, over-the-counter, or herbal) for morning sickness without first talking to your health care provider. Your health care provider may prescribe or recommend:  Vitamin B6 supplements.  Anti-nausea medicines.  The herbal medicine ginger. HOME CARE INSTRUCTIONS   Only take over-the-counter or prescription medicines as directed by your health care provider.  Taking multivitamins before getting pregnant can prevent or decrease the severity of morning sickness in most women.  Eat a piece of dry toast or unsalted crackers  before getting out of bed in the morning.  Eat five or six small meals a day.  Eat dry and bland foods (rice, baked potato). Foods high in carbohydrates are often  helpful.  Do not drink liquids with your meals. Drink liquids between meals.  Avoid greasy, fatty, and spicy foods.  Get someone to cook for you if the smell of any food causes nausea and vomiting.  If you feel nauseous after taking prenatal vitamins, take the vitamins at night or with a snack.  Snack on protein foods (nuts, yogurt, cheese) between meals if you are hungry.  Eat unsweetened gelatins for desserts.  Wearing an acupressure wristband (worn for sea sickness) may be helpful.  Acupuncture may be helpful.  Do not smoke.  Get a humidifier to keep the air in your house free of odors.  Get plenty of fresh air. SEEK MEDICAL CARE IF:   Your home remedies are not working, and you need medicine.  You feel dizzy or lightheaded.  You are losing weight. SEEK IMMEDIATE MEDICAL CARE IF:   You have persistent and uncontrolled nausea and vomiting.  You pass out (faint). MAKE SURE YOU:  Understand these instructions.  Will watch your condition.  Will get help right away if you are not doing well or get worse. Document Released: 09/01/2006 Document Revised: 07/16/2013 Document Reviewed: 12/26/2012 [facility] Patient Information 2015 [place]Care, [place]. This information is not intended to replace advice given to you by your health care provider. Make sure you discuss any questions you have with your health care provider. Bacterial Vaginosis Bacterial vaginosis is a vaginal infection that occurs when the normal balance of bacteria in the vagina is disrupted. It results from an overgrowth of certain bacteria. This is the most common vaginal infection in women of childbearing age. Treatment is important to prevent complications, especially in pregnant women, as it can cause a premature delivery. CAUSES  Bacterial vaginosis is caused by an increase in harmful bacteria that are normally present in smaller amounts in the vagina. Several different kinds of bacteria can cause bacterial  vaginosis. However, the reason that the condition develops is not fully understood. RISK FACTORS Certain activities or behaviors can put you at an increased risk of developing bacterial vaginosis, including:  Having a new sex partner or multiple sex partners.  Douching.  Using an intrauterine device (IUD) for contraception. Women do not get bacterial vaginosis from toilet seats, bedding, swimming pools, or contact with objects around them. SIGNS AND SYMPTOMS  Some women with bacterial vaginosis have no signs or symptoms. Common symptoms include:  Grey vaginal discharge.  A fishlike odor with discharge, especially after sexual intercourse.  Itching or burning of the vagina and vulva.  Burning or pain with urination. DIAGNOSIS  Your health care provider will take a medical history and examine the vagina for signs of bacterial vaginosis. A sample of vaginal fluid may be taken. Your health care provider will look at this sample under a microscope to check for bacteria and abnormal cells. A vaginal pH test may also be done.  TREATMENT  Bacterial vaginosis may be treated with antibiotic medicines. These may be given in the form of a pill or a vaginal cream. A second round of antibiotics may be prescribed if the condition comes back after treatment.  HOME CARE INSTRUCTIONS   Only take over-the-counter or prescription medicines as directed by your health care provider.  If antibiotic medicine was prescribed, take it as directed. Make sure you finish it even  if you start to feel better.  Do not have sex until treatment is completed.  Tell all sexual partners that you have a vaginal infection. They should see their health care provider and be treated if they have problems, such as a mild rash or itching.  Practice safe sex by using condoms and only having one sex partner. SEEK MEDICAL CARE IF:   Your symptoms are not improving after 3 days of treatment.  You have increased discharge or  pain.  You have a fever. MAKE SURE YOU:   Understand these instructions.  Will watch your condition.  Will get help right away if you are not doing well or get worse. FOR MORE INFORMATION  Centers for Disease Control and Prevention, Division of STD Prevention: SolutionApps.co.za American Sexual Health Association (ASHA): www.ashastd.org  Document Released: 07/11/2005 Document Revised: 05/01/2013 Document Reviewed: 02/20/2013 [facility] Patient Information 2015 [place], [place]. This information is not intended to replace advice given to you by your health care provider. Make sure you discuss any questions you have with your health care provider.

## 2014-08-09 NOTE — MAU Provider Note (Signed)
History     CSN: [id number]  Arrival date and time: 08/09/14 1944   First Provider Initiated Contact with Patient 08/09/14 2032      Chief Complaint  Patient presents with  . Abdominal Pain   HPI Comments: Sandra Anderson 24 y.o. [id number] [redacted]w[redacted]d presents to MAU with abdominal pains ongoing for 2 weeks. She denies any bleeding. She has nausea and would like medication. She rates the pain as 8/10 and has taken no medications. She has an appointment with [facility] in early Feb.  Abdominal Pain Associated symptoms include nausea and vomiting.      Past Medical History  Diagnosis Date  . No pertinent past medical history     Past Surgical History  Procedure Laterality Date  . No past surgeries      Family History  Problem Relation Age of Onset  . Hypertension Mother     History  Substance Use Topics  . Smoking status: Current Every Day Smoker    Types: Cigarettes  . Smokeless tobacco: Never Used  . Alcohol Use: No    Allergies: No Known Allergies  Prescriptions prior to admission  Medication Sig Dispense Refill Last Dose  . Prenatal Vit-Fe Fumarate-FA (PRENATAL VITAMINS) 28-0.8 MG TABS Take 1 tablet by mouth daily. 30 tablet 5 08/09/2014 at Unknown time  . ARIPiprazole (ABILIFY) 5 MG tablet Take 7.5 mg by mouth daily.   Unknown at Unknown time  . cyclobenzaprine (FLEXERIL) 10 MG tablet Take 1 tablet (10 mg total) by mouth 3 (three) times daily as needed for muscle spasms. 30 tablet 0 Unknown at Unknown time  . promethazine (PHENERGAN) 25 MG tablet Take 0.5-1 tablets (12.5-25 mg total) by mouth every 6 (six) hours as needed. 30 tablet 2     Review of Systems  Constitutional: Negative.   HENT: Negative.   Eyes: Negative.   Respiratory: Negative.   Cardiovascular: Negative.   Gastrointestinal: Positive for nausea, vomiting and abdominal pain.  Genitourinary: Negative.   Musculoskeletal: Negative.   Skin: Negative.   Neurological: Negative.   Psychiatric/Behavioral:  Negative.    Physical Exam   Blood pressure 126/73, pulse 81, temperature 98.6 F (37 C), temperature source Oral, resp. rate 20, height 5' 7.5" (1.715 m), weight 67.767 kg (149 lb 6.4 oz), last menstrual period 06/11/2014.  Physical Exam  Constitutional: She is oriented to person, place, and time. She appears well-developed and well-nourished. No distress.  HENT:  Head: Normocephalic and atraumatic.  Eyes: Pupils are equal, round, and reactive to light.  GI: Soft. She exhibits no distension. There is no tenderness. There is no rebound and no guarding.  Genitourinary:  Genital:External negative Vaginal:small amount white odorous discharge Cervix:closed/ thick Bimanual: gravid/ more pressure than pain   Musculoskeletal: Normal range of motion.  Neurological: She is alert and oriented to person, place, and time.  Skin: Skin is warm and dry.  Psychiatric: She has a normal mood and affect. Her behavior is normal. Judgment and thought content normal.   Results for orders placed or performed during the hospital encounter of 08/09/14 (from the past 24 hour(s))  Urinalysis, Routine w reflex microscopic     Status: Abnormal   Collection Time: 08/09/14  8:00 PM  Result Value Ref Range   Color, Urine YELLOW YELLOW   APPearance CLEAR CLEAR   Specific Gravity, Urine 1.010 1.005 - 1.030   pH 6.0 5.0 - 8.0   Glucose, UA NEGATIVE NEGATIVE mg/dL   Hgb urine dipstick NEGATIVE NEGATIVE   Bilirubin Urine  NEGATIVE NEGATIVE   Ketones, ur NEGATIVE NEGATIVE mg/dL   Protein, ur NEGATIVE NEGATIVE mg/dL   Urobilinogen, UA 1.0 0.0 - 1.0 mg/dL   Nitrite NEGATIVE NEGATIVE   Leukocytes, UA MODERATE (A) NEGATIVE  Urine microscopic-add on     Status: Abnormal   Collection Time: 08/09/14  8:00 PM  Result Value Ref Range   Squamous Epithelial / LPF MANY (A) RARE   WBC, UA 0-2 <3 WBC/hpf   RBC / HPF 0-2 <3 RBC/hpf   Bacteria, UA RARE RARE  Wet prep, genital     Status: Abnormal   Collection Time:  08/09/14  8:40 PM  Result Value Ref Range   Yeast Wet Prep HPF POC FEW (A) NONE SEEN   Trich, Wet Prep NONE SEEN NONE SEEN   Clue Cells Wet Prep HPF POC MODERATE (A) NONE SEEN   WBC, Wet Prep HPF POC MODERATE (A) NONE SEEN  CBC     Status: None   Collection Time: 08/09/14  8:50 PM  Result Value Ref Range   WBC 7.5 4.0 - 10.5 K/uL   RBC 4.76 3.87 - 5.11 MIL/uL   Hemoglobin 13.1 12.0 - 15.0 g/dL   HCT 40.939.2 81.136.0 - 91.446.0 %   MCV 82.4 78.0 - 100.0 fL   MCH 27.5 26.0 - 34.0 pg   MCHC 33.4 30.0 - 36.0 g/dL   RDW 78.213.2 95.611.5 - 21.315.5 %   Platelets 289 150 - 400 K/uL   [place] Ob Comp Less 14 Wks  08/09/2014   CLINICAL DATA:  Subacute onset of cramping and abdominal pain for 3 weeks. Initial encounter.  EXAM: OBSTETRIC <14 WK US AND TRANSVAGINAL OB US  TECHNIQUE: Both transabdominal and transvaginal ultrasound examinations were performed for complete evaluation of the gestation as well as the maternal uterus, adnexal regions, and pelvic cul-de-sac. Transvaginal technique was performed to assess early pregnancy.  COMPARISON:  Pelvic ultrasound performed 04/15/2011  FINDINGS: Intrauterine gestational sac: Visualized/normal in shape.  Yolk sac:  Yes  Embryo:  Yes  Cardiac Activity: Yes  Heart Rate: 152 bpm  CRL:   1.31 cm   7 w 4 d                  US EDC: 03/24/2015  Maternal uterus/adnexae: No subchorionic hemorrhage is noted. The uterus otherwise unremarkable.  The ovaries are within normal limits. The right ovary measures 3.4 x 1.4 x 1.6 cm, while the left ovary measures 3.8 x 1.9 x 2.0 cm. No suspicious adnexal masses are seen; there is no evidence for ovarian torsion.  No free fluid is seen within the pelvic cul-de-sac.  IMPRESSION: Single live intrauterine pregnancy noted, with a crown-rump length of 1.3 cm, corresponding to a gestational age of [redacted] weeks 4 days. This matches the gestational age of [redacted] weeks 3 days by LMP, reflecting an estimated date of delivery of March 25, 2015.   Electronically Signed   By:  Roanna RaiderJeffery  Chang M.D.   On: 08/09/2014 21:30   [place] Ob Transvaginal  08/09/2014   CLINICAL DATA:  Subacute onset of cramping and abdominal pain for 3 weeks. Initial encounter.  EXAM: OBSTETRIC <14 WK US AND TRANSVAGINAL OB US  TECHNIQUE: Both transabdominal and transvaginal ultrasound examinations were performed for complete evaluation of the gestation as well as the maternal uterus, adnexal regions, and pelvic cul-de-sac. Transvaginal technique was performed to assess early pregnancy.  COMPARISON:  Pelvic ultrasound performed 04/15/2011  FINDINGS: Intrauterine gestational sac: Visualized/normal in shape.  Yolk sac:  Yes  Embryo:  Yes  Cardiac Activity: Yes  Heart Rate: 152 bpm  CRL:   1.31 cm   7 w 4 d                  [place] EDC: 03/24/2015  Maternal uterus/adnexae: No subchorionic hemorrhage is noted. The uterus otherwise unremarkable.  The ovaries are within normal limits. The right ovary measures 3.4 x 1.4 x 1.6 cm, while the left ovary measures 3.8 x 1.9 x 2.0 cm. No suspicious adnexal masses are seen; there is no evidence for ovarian torsion.  No free fluid is seen within the pelvic cul-de-sac.  IMPRESSION: Single live intrauterine pregnancy noted, with a crown-rump length of 1.3 cm, corresponding to a gestational age of [redacted] weeks 4 days. This matches the gestational age of [redacted] weeks 3 days by LMP, reflecting an estimated date of delivery of March 25, 2015.   Electronically Signed   By: Roanna Raider M.D.   On: 08/09/2014 21:30     MAU Course  Procedures  MDM Wet prep, GC, Chlamydia, CBC, UA, U/S, ABORh, Quant, HIV Tylenol, reglan Reviewed all labs and ultrasound reports with patient  Assessment and Plan   A: Abdominal pain in early pregnancy Bacterial vaginosis Nausea in pregnancy  P: Above orders Flagyl 500 mg PO BID x 7 days Reglan 10 mg PO q8 hours prn nausea Plans care with [facility] Return to MAU as needed  Carolynn Serve 08/09/2014, 8:43 PM

## 2014-08-10 LAB — HIV ANTIBODY (ROUTINE TESTING W REFLEX): HIV-1/HIV-2 Ab: NONREACTIVE

## 2014-08-11 LAB — GC/CHLAMYDIA PROBE AMP (~~LOC~~) NOT AT ARMC
CHLAMYDIA, DNA PROBE: NEGATIVE
Neisseria Gonorrhea: NEGATIVE

## 2014-08-26 ENCOUNTER — Encounter: Payer: Self-pay | Admitting: Obstetrics

## 2014-08-26 ENCOUNTER — Other Ambulatory Visit: Payer: Self-pay | Admitting: Obstetrics

## 2014-08-26 ENCOUNTER — Ambulatory Visit (INDEPENDENT_AMBULATORY_CARE_PROVIDER_SITE_OTHER): Payer: Medicaid Other | Admitting: Obstetrics

## 2014-08-26 VITALS — BP 126/71 | HR 72 | Temp 97.4°F | Wt 148.0 lb

## 2014-08-26 DIAGNOSIS — Z36 Encounter for antenatal screening of mother: Secondary | ICD-10-CM

## 2014-08-26 DIAGNOSIS — Z3481 Encounter for supervision of other normal pregnancy, first trimester: Secondary | ICD-10-CM

## 2014-08-26 DIAGNOSIS — O2341 Unspecified infection of urinary tract in pregnancy, first trimester: Secondary | ICD-10-CM

## 2014-08-26 DIAGNOSIS — Z3687 Encounter for antenatal screening for uncertain dates: Secondary | ICD-10-CM

## 2014-08-26 DIAGNOSIS — O219 Vomiting of pregnancy, unspecified: Secondary | ICD-10-CM

## 2014-08-26 LAB — POCT URINALYSIS DIPSTICK
Bilirubin, UA: NEGATIVE
Glucose, UA: NEGATIVE
KETONES UA: NEGATIVE
Nitrite, UA: POSITIVE
RBC UA: NEGATIVE
Spec Grav, UA: 1.015
UROBILINOGEN UA: NEGATIVE
pH, UA: 6

## 2014-08-26 MED ORDER — DOXYLAMINE SUCCINATE (SLEEP) 25 MG PO TABS
25.0000 mg | ORAL_TABLET | Freq: Every evening | ORAL | Status: DC | PRN
Start: 2014-08-26 — End: 2014-11-23

## 2014-08-26 MED ORDER — VITAMIN B-6 100 MG PO TABS: 100.0000 mg | ORAL_TABLET | Freq: Every day | ORAL | Status: DC

## 2014-08-26 MED ORDER — NITROFURANTOIN MONOHYD MACRO 100 MG PO CAPS: 100.0000 mg | ORAL_CAPSULE | Freq: Two times a day (BID) | ORAL | Status: DC

## 2014-08-26 NOTE — Progress Notes (Signed)
Subjective:    Sandra Anderson is being seen today for her first obstetrical visit.  This is not a planned pregnancy. She is at [redacted]w[redacted]d gestation. Her obstetrical history is significant for none. Relationship with FOB: significant other, not living together. Patient does intend to breast feed. Pregnancy history fully reviewed.  The information documented in the HPI was reviewed and verified.  Menstrual History: OB History    Gravida Para Term Preterm AB TAB SAB Ectopic Multiple Living   0      2       Patient's last menstrual period was 06/11/2014.    Past Medical History  Diagnosis Date  . No pertinent past medical history     Past Surgical History  Procedure Laterality Date  . No past surgeries       (Not in a hospital admission) No Known Allergies  History  Substance Use Topics  . Smoking status: Current Every Day Smoker    Types: Cigarettes  . Smokeless tobacco: Never Used  . Alcohol Use: No    Family History  Problem Relation Age of Onset  . Hypertension Mother   . Cirrhosis Mother      Review of Systems Constitutional: negative for weight loss Gastrointestinal: negative for vomiting Genitourinary:negative for genital lesions and vaginal discharge and dysuria Musculoskeletal:negative for back pain Behavioral/Psych: negative for abusive relationship, depression, illegal drug usage and tobacco use    Objective:    BP 126/71 mmHg  Pulse 72  Temp(Src) 97.4 F (36.3 C)  Wt 148 lb (67.132 kg)  LMP 06/11/2014 General Appearance:    Alert, cooperative, no distress, appears stated age  Head:    Normocephalic, without obvious abnormality, atraumatic  Eyes:    PERRL, conjunctiva/corneas clear, EOM's intact, fundi    benign, both eyes  Ears:    Normal TM's and external ear canals, both ears  Nose:   Nares normal, septum midline, mucosa normal, no drainage    or sinus tenderness  Throat:   Lips, mucosa, and tongue normal; teeth and gums normal  Neck:   Supple,  symmetrical, trachea midline, no adenopathy;    thyroid:  no enlargement/tenderness/nodules; no carotid   bruit or JVD  Back:     Symmetric, no curvature, ROM normal, no CVA tenderness  Lungs:     Clear to auscultation bilaterally, respirations unlabored  Chest Wall:    No tenderness or deformity   Heart:    Regular rate and rhythm, S1 and S2 normal, no murmur, rub   or gallop  Breast Exam:    No tenderness, masses, or nipple abnormality  Abdomen:     Soft, non-tender, bowel sounds active all four quadrants,    no masses, no organomegaly  Genitalia:    Normal female without lesion, discharge or tenderness  Extremities:   Extremities normal, atraumatic, no cyanosis or edema  Pulses:   2+ and symmetric all extremities  Skin:   Skin color, texture, turgor normal, no rashes or lesions  Lymph nodes:   Cervical, supraclavicular, and axillary nodes normal  Neurologic:   CNII-XII intact, normal strength, sensation and reflexes    throughout      Lab Review Urine pregnancy test Labs reviewed yes Radiologic studies reviewed no Assessment:    Pregnancy at [redacted]w[redacted]d weeks    Cardiac activity not heard with Doppler, but seen with U/S   Plan:     Ultrasound ordered for dating. Prenatal vitamins.  Counseling provided regarding continued use of seat belts,  cessation of alcohol consumption, smoking or use of illicit drugs; infection precautions i.e., influenza/TDAP immunizations, toxoplasmosis,CMV, parvovirus, listeria and varicella; workplace safety, exercise during pregnancy; routine dental care, safe medications, sexual activity, hot tubs, saunas, pools, travel, caffeine use, fish and methlymercury, potential toxins, hair treatments, varicose veins Weight gain recommendations per IOM guidelines reviewed: underweight/BMI< 18.5--> gain 28 - 40 lbs; normal weight/BMI 18.5 - 24.9--> gain 25 - 35 lbs; overweight/BMI 25 - 29.9--> gain 15 - 25 lbs; obese/BMI >30->gain  11 - 20 lbs Problem list reviewed and  updated. FIRST/CF mutation testing/NIPT/QUAD SCREEN/fragile X/Ashkenazi Jewish population testing/Spinal muscular atrophy discussed: requested. Role of ultrasound in pregnancy discussed; fetal survey: requested. Amniocentesis discussed: not indicated. VBAC calculator score: VBAC consent form provided Meds ordered this encounter  Medications  . pyridOXINE (VITAMIN B-6) 100 MG tablet    Sig: Take 1 tablet (100 mg total) by mouth at bedtime.    Dispense:  30 tablet    Refill:  5  . doxylamine, Sleep, (UNISOM) 25 MG tablet    Sig: Take 1 tablet (25 mg total) by mouth at bedtime as needed.    Dispense:  30 tablet    Refill:  5   Orders Placed This Encounter  Procedures  . Culture, OB Urine  . US OB Limited    Standing Status: Future     Number of Occurrences:      Standing Expiration Date: 10/25/2015    Order Specific Question:  Reason for Exam (SYMPTOM  OR DIAGNOSIS REQUIRED)    Answer:  unsure lmp    Order Specific Question:  Preferred imaging location?    Answer:  Internal  . Obstetric panel  . HIV antibody  . Hemoglobinopathy evaluation  . Varicella zoster antibody, IgG  . Vit D  25 hydroxy (rtn osteoporosis monitoring)  . POCT urinalysis dipstick    Follow up in 4 weeks.

## 2014-08-27 ENCOUNTER — Ambulatory Visit (INDEPENDENT_AMBULATORY_CARE_PROVIDER_SITE_OTHER): Payer: Medicaid Other

## 2014-08-27 ENCOUNTER — Other Ambulatory Visit: Payer: Self-pay | Admitting: Obstetrics

## 2014-08-27 DIAGNOSIS — O3680X1 Pregnancy with inconclusive fetal viability, fetus 1: Secondary | ICD-10-CM

## 2014-08-27 DIAGNOSIS — Z3687 Encounter for antenatal screening for uncertain dates: Secondary | ICD-10-CM

## 2014-08-27 LAB — OBSTETRIC PANEL
Antibody Screen: NEGATIVE
BASOS PCT: 1 % (ref 0–1)
Basophils Absolute: 0 10*3/uL (ref 0.0–0.1)
EOS PCT: 2 % (ref 0–5)
Eosinophils Absolute: 0.1 10*3/uL (ref 0.0–0.7)
HCT: 37.8 % (ref 36.0–46.0)
HEP B S AG: NEGATIVE
Hemoglobin: 12.5 g/dL (ref 12.0–15.0)
LYMPHS ABS: 1.5 10*3/uL (ref 0.7–4.0)
LYMPHS PCT: 32 % (ref 12–46)
MCH: 27.2 pg (ref 26.0–34.0)
MCHC: 33.1 g/dL (ref 30.0–36.0)
MCV: 82.4 fL (ref 78.0–100.0)
MONO ABS: 0.4 10*3/uL (ref 0.1–1.0)
MPV: 9.2 fL (ref 8.6–12.4)
Monocytes Relative: 9 % (ref 3–12)
Neutro Abs: 2.7 10*3/uL (ref 1.7–7.7)
Neutrophils Relative %: 56 % (ref 43–77)
Platelets: 284 10*3/uL (ref 150–400)
RBC: 4.59 MIL/uL (ref 3.87–5.11)
RDW: 13.9 % (ref 11.5–15.5)
RH TYPE: POSITIVE
RUBELLA: 2.73 {index} — AB (ref <0.90)
WBC: 4.8 10*3/uL (ref 4.0–10.5)

## 2014-08-27 LAB — VARICELLA ZOSTER ANTIBODY, IGG: Varicella IgG: 13.48 Index (ref <135.00)

## 2014-08-27 LAB — CULTURE, OB URINE
Colony Count: NO GROWTH
ORGANISM ID, BACTERIA: NO GROWTH

## 2014-08-27 LAB — VITAMIN D 25 HYDROXY (VIT D DEFICIENCY, FRACTURES): Vit D, 25-Hydroxy: 13 ng/mL — ABNORMAL LOW (ref 30–100)

## 2014-08-27 LAB — US OB COMP LESS 14 WKS

## 2014-08-27 LAB — HIV ANTIBODY (ROUTINE TESTING W REFLEX): HIV: NONREACTIVE

## 2014-09-01 LAB — HEMOGLOBINOPATHY EVALUATION
Hemoglobin Other: 0 %
Hgb A2 Quant: 2.8 % (ref 2.2–3.2)
Hgb A: 97.2 % (ref 96.8–97.8)
Hgb F Quant: 0 % (ref 0.0–2.0)
Hgb S Quant: 0 %

## 2014-09-23 ENCOUNTER — Encounter (INDEPENDENT_AMBULATORY_CARE_PROVIDER_SITE_OTHER): Payer: Self-pay

## 2014-09-23 ENCOUNTER — Ambulatory Visit (INDEPENDENT_AMBULATORY_CARE_PROVIDER_SITE_OTHER): Payer: Medicaid Other | Admitting: Obstetrics

## 2014-09-23 ENCOUNTER — Encounter: Payer: Medicaid Other | Admitting: Obstetrics

## 2014-09-23 VITALS — BP 109/68 | HR 79 | Temp 98.5°F | Wt 149.0 lb

## 2014-09-23 DIAGNOSIS — Z3492 Encounter for supervision of normal pregnancy, unspecified, second trimester: Secondary | ICD-10-CM

## 2014-09-24 ENCOUNTER — Encounter: Payer: Self-pay | Admitting: Obstetrics

## 2014-09-24 NOTE — Progress Notes (Signed)
  Subjective:    Sandra Anderson is a 24 y.o. female being seen today for her obstetrical visit. She is at 5464w0d gestation. Patient reports: no complaints.  Problem List Items Addressed This Visit    None    Visit Diagnoses    Supervision of normal pregnancy, second trimester    -  Primary    Relevant Orders    AFP, Quad Screen      Patient Active Problem List   Diagnosis Date Noted  . IUGR (intrauterine growth restriction) in prior pregnancy, pregnant 04/13/2011  . Small for gestational age (SGA) 04/13/2011    Objective:     BP 109/68 mmHg  Pulse 79  Temp(Src) 98.5 F (36.9 C)  Wt 149 lb (67.586 kg)  LMP 06/11/2014 Uterine Size: Below umbilicus     Assessment:    Pregnancy @ 5164w0d  weeks Doing well    Plan:    Problem list reviewed and updated. Labs reviewed.  Follow up in 4 weeks. FIRST/CF mutation testing/NIPT/QUAD SCREEN/fragile X/Ashkenazi Jewish population testing/Spinal muscular atrophy discussed: requested. Role of ultrasound in pregnancy discussed; fetal survey: requested. Amniocentesis discussed: not indicated.

## 2014-10-02 ENCOUNTER — Inpatient Hospital Stay (HOSPITAL_COMMUNITY)
Admission: AD | Admit: 2014-10-02 | Discharge: 2014-10-02 | Disposition: A | Discharge status: Home / Self Care | Payer: Medicaid Other | Source: Ambulatory Visit | Attending: Obstetrics | Admitting: Obstetrics

## 2014-10-02 DIAGNOSIS — Z3A16 16 weeks gestation of pregnancy: Secondary | ICD-10-CM | POA: Insufficient documentation

## 2014-10-02 DIAGNOSIS — O368120 Decreased fetal movements, second trimester, not applicable or unspecified: Secondary | ICD-10-CM | POA: Insufficient documentation

## 2014-10-02 NOTE — MAU Note (Signed)
Pt reports she has not felt the baby move. Was concerned. Denies pain or bleeding at this time.

## 2014-10-02 NOTE — MAU Provider Note (Signed)
S: 24 y.o. [id number] @[redacted]w[redacted]d  presents to MAU because she has not yet felt fetal movement in this pregnancy and wants to know the gender of her baby. Her next appointment is scheduled with Dr Clearance CootsHarper on March 15.  She denies abdominal pain, LOF, vaginal bleeding, vaginal itching/burning, urinary symptoms, h/a, dizziness, n/v, or fever/chills.    O: BP 117/60 mmHg  Pulse 96  Temp(Src) 98.3 F (36.8 C)  Resp 18  Ht 5\' 7"  (1.702 m)  Wt 68.856 kg (151 lb 12.8 oz)  BMI 23.77 kg/m2  LMP 06/11/2014  Physical Examination: General appearance - alert, well appearing, and in no distress, oriented to person, place, and time, normal appearing weight and acyanotic, in no respiratory distress  FHT 140s by doppler  A: IUP @ 16 weeks 1 day   P: D/C home Reassurance provided about normalcy of no fetal movement at this gestational age Likely will feel fetal movement prior to 22 weeks F/U with Dr Clearance CootsHarper for prenatal care/anatomy ultrasound  LEFTWICH-KIRBY, Jeffery Gammell, CNM 1:29 PM

## 2014-10-02 NOTE — MAU Note (Signed)
L.Sandra Anderson talked with pt about FHR and movement. Pt to f/u with Dr. Clearance CootsHarper for scheduled anatomy U/S

## 2014-10-03 ENCOUNTER — Encounter: Payer: Self-pay | Admitting: *Deleted

## 2014-10-03 LAB — AFP, QUAD SCREEN
AFP: 22 ng/mL
Age Alone: 1:1070 {titer}
Curr Gest Age: 14 wks.days
Down Syndrome Scr Risk Est: 1:12 {titer}
HCG TOTAL: 145.79 [IU]/mL
INH: 729.5 pg/mL
INTERPRETATION-AFP: POSITIVE — AB
MOM FOR AFP: 0.74
MoM for INH: 3.62
MoM for hCG: 2.08
OPEN SPINA BIFIDA: NEGATIVE
Osb Risk: <1:54600 {titer}
Tri 18 Scr Risk Est: NEGATIVE
Trisomy 18 (Edward) Syndrome Interp.: 1:24000 {titer}
uE3 Mom: 0.62
uE3 Value: 0.29 ng/mL

## 2014-10-04 ENCOUNTER — Other Ambulatory Visit: Payer: Self-pay | Admitting: Obstetrics

## 2014-10-04 DIAGNOSIS — O280 Abnormal hematological finding on antenatal screening of mother: Secondary | ICD-10-CM

## 2014-10-07 ENCOUNTER — Encounter: Payer: Self-pay | Admitting: Obstetrics

## 2014-10-07 ENCOUNTER — Ambulatory Visit (INDEPENDENT_AMBULATORY_CARE_PROVIDER_SITE_OTHER): Payer: Medicaid Other | Admitting: Obstetrics

## 2014-10-07 VITALS — BP 123/75 | HR 66 | Temp 97.8°F | Wt 151.0 lb

## 2014-10-07 DIAGNOSIS — IMO0002 Reserved for concepts with insufficient information to code with codable children: Secondary | ICD-10-CM

## 2014-10-07 DIAGNOSIS — G44019 Episodic cluster headache, not intractable: Secondary | ICD-10-CM

## 2014-10-07 DIAGNOSIS — Z0489 Encounter for examination and observation for other specified reasons: Secondary | ICD-10-CM

## 2014-10-07 DIAGNOSIS — Z3482 Encounter for supervision of other normal pregnancy, second trimester: Secondary | ICD-10-CM

## 2014-10-07 DIAGNOSIS — Z36 Encounter for antenatal screening of mother: Secondary | ICD-10-CM

## 2014-10-07 MED ORDER — BUTALBITAL-APAP-CAFFEINE 50-325-40 MG PO TABS: 2.0000 | ORAL_TABLET | Freq: Four times a day (QID) | ORAL | Status: DC | PRN

## 2014-10-09 ENCOUNTER — Ambulatory Visit (HOSPITAL_COMMUNITY)
Admission: RE | Admit: 2014-10-09 | Discharge: 2014-10-09 | Disposition: A | Discharge status: Home / Self Care | Payer: Medicaid Other | Source: Ambulatory Visit | Attending: Obstetrics | Admitting: Obstetrics

## 2014-10-09 DIAGNOSIS — Z3A16 16 weeks gestation of pregnancy: Secondary | ICD-10-CM | POA: Insufficient documentation

## 2014-10-09 DIAGNOSIS — Z315 Encounter for genetic counseling: Secondary | ICD-10-CM | POA: Diagnosis not present

## 2014-10-09 DIAGNOSIS — F17210 Nicotine dependence, cigarettes, uncomplicated: Secondary | ICD-10-CM | POA: Diagnosis not present

## 2014-10-09 DIAGNOSIS — O99332 Smoking (tobacco) complicating pregnancy, second trimester: Secondary | ICD-10-CM | POA: Insufficient documentation

## 2014-10-09 DIAGNOSIS — O351XX0 Maternal care for (suspected) chromosomal abnormality in fetus, not applicable or unspecified: Secondary | ICD-10-CM | POA: Diagnosis not present

## 2014-10-09 DIAGNOSIS — O280 Abnormal hematological finding on antenatal screening of mother: Secondary | ICD-10-CM | POA: Insufficient documentation

## 2014-10-09 NOTE — Progress Notes (Signed)
  Subjective:    Leilani Ablemillia Falin is a 24 y.o. female being seen today for her obstetrical visit. She is at 5612w2d gestation. Patient reports: no complaints.  Problem List Items Addressed This Visit    None    Visit Diagnoses    Encounter for supervision of other normal pregnancy in second trimester    -  Primary    Relevant Orders    POCT urinalysis dipstick    Episodic cluster headache, not intractable        Relevant Medications    butalbital-acetaminophen-caffeine (FIORICET) tablet 50-325-40 mg     Evaluate anatomy not seen on prior sonogram        Relevant Orders    US OB Comp + 14 Wk      Patient Active Problem List   Diagnosis Date Noted  . IUGR (intrauterine growth restriction) in prior pregnancy, pregnant 04/13/2011  . Small for gestational age (SGA) 04/13/2011    Objective:     BP 123/75 mmHg  Pulse 66  Temp(Src) 97.8 F (36.6 C)  Wt 151 lb (68.493 kg)  LMP 06/11/2014 Uterine Size: Below umbilicus     Assessment:    Pregnancy @ 5112w2d  weeks Doing well    Plan:    Problem list reviewed and updated. Labs reviewed.  Follow up in 4 weeks. FIRST/CF mutation testing/NIPT/QUAD SCREEN/fragile X/Ashkenazi Jewish population testing/Spinal muscular atrophy discussed: requested. Role of ultrasound in pregnancy discussed; fetal survey: requested. Amniocentesis discussed: not indicated.

## 2014-10-09 NOTE — Progress Notes (Signed)
Genetic Counseling  High-Risk Gestation Note  Appointment Date:  10/09/2014 Referred By: Brock Bad, MD Date of Birth:  03/21/1991   Pregnancy History: [id number] Estimated Date of Delivery: 03/24/15 Estimated Gestational Age: [redacted]w[redacted]d Attending: Alpha Gula, MD   Ms. Sandra Anderson was seen for genetic counseling because of an increased risk for fetal Down syndrome based on Quad screening through [facility].  She was counseled regarding the Quad screen result and the associated 1 in 12 risk for fetal Down syndrome.  We reviewed chromosomes, nondisjunction, and the common features and variable prognosis of Down syndrome/features and poor prognosis of trisomy 17.  In addition, we reviewed the screen adjusted reduction in risks for trisomy 18 and ONTDs.  We also discussed other explanations for a screen positive result including: a gestational dating error, differences in maternal metabolism, and normal variation. They understand that this screening is not diagnostic for Down syndrome but provides a risk assessment. Additionally, we specifically discussed that the level of one of the proteins analyzed on the screen, DIA, was very high (3.62 MoM).  This has been associated with an increased risk for growth restriction or poor pregnancy outcome later in pregnancy; therefore, we would recommend a follow up ultrasound for fetal growth in the third trimester.  We reviewed available screening options including noninvasive prenatal screening (NIPS)/cell free DNA (cfDNA) testing and detailed ultrasound (at approximately [redacted] weeks gestation).  She was counseled that screening tests are used to modify a patient's a priori risk for aneuploidy, typically based on age. This estimate provides a pregnancy specific risk assessment. We reviewed the benefits and limitations of each option. Specifically, we discussed the conditions for which each test screens, the detection rates, and false positive rates of  each. She was also counseled regarding diagnostic testing via amniocentesis. We reviewed the approximate 1 in 300-500 risk for complications for amniocentesis, including spontaneous pregnancy loss.   After consideration of all the options, she elected to proceed with NIPS (Panorama through [facility]) at the time of today's visit.  Those results will be available in 8-10 days.  The patient also expressed interest in having a detailed ultrasound, which is scheduled for 10/21/14.  Ms. Sandra Anderson declined amniocentesis.  She understands that screening tests cannot rule out all birth defects or genetic syndromes. The patient was advised of this limitation and states she still does not want additional testing at this time.   Ms. Sandra Anderson was provided with written information regarding sickle cell anemia (SCA) including the carrier frequency and incidence in the African-American population, the availability of carrier testing and prenatal diagnosis if indicated.  In addition, we discussed that hemoglobinopathies are routinely screened for as part of the [place] newborn screening panel.  Hemoglobin electrophoresis was performed through her OB office and indicated the presence of normal adult hemoglobin.    Both family histories were reviewed and found to be noncontributory for birth defects, intellectual disability, recurrent pregnancy loss, and known genetic conditions. Without further information regarding the provided family history, an accurate genetic risk cannot be calculated. Further genetic counseling is warranted if more information is obtained.  Ms. Sandra Anderson denied exposure to environmental toxins or chemical agents. She denied the use of alcohol or street drugs. She reported smoking approximately less than 1 cigarette per day. The associations of smoking in pregnancy were reviewed and cessation encouraged. She denied significant viral illnesses during the course of her pregnancy. Her medical and  surgical histories were noncontributory.   I counseled Ms.  Sandra AbleAmillia Anderson for approximately 40 minutes regarding the above risks and available options.   Quinn PlowmanKaren Jeilyn Reznik, MS,  Certified Genetic Counselor  10/09/2014

## 2014-10-15 ENCOUNTER — Telehealth (HOSPITAL_COMMUNITY): Payer: Self-pay | Admitting: Genetics

## 2014-10-15 NOTE — Telephone Encounter (Signed)
Called Sandra Anderson to discuss her cell free fetal DNA test results.  Mrs. Sandra Anderson had Panorama testing through [facility].  Testing was offered because of an increased risk for Down syndrome based on a maternal serum Quad screen.   The patient was identified by name and DOB.  We reviewed that these are within normal limits, showing a less than 1 in 10,000 risk for trisomies 21, 18 and 13, and monosomy X (Turner syndrome).  In addition, the risk for triploidy/vanishing twin and sex chromosome trisomies (47,XXX and 47,XXY) was also low risk.  We reviewed that this testing identifies > 99% of pregnancies with trisomy 3721, trisomy 1813, sex chromosome trisomies (47,XXX and 47,XXY), and triploidy. The detection rate for trisomy 18 is 96%.  The detection rate for monosomy X is ~92%.  The false positive rate is <0.1% for all conditions. Testing was also consistent with female fetal sex.  The patient did wish to know fetal sex.  She understands that this testing does not identify all genetic conditions.  All questions were answered to her satisfaction, she was encouraged to call with additional questions or concerns.  Sandra Gemmaaragh Aric Jost, MS Certified Genetic Counselor

## 2014-10-16 ENCOUNTER — Other Ambulatory Visit (HOSPITAL_COMMUNITY): Payer: Self-pay | Admitting: Obstetrics

## 2014-10-21 ENCOUNTER — Encounter (HOSPITAL_COMMUNITY): Payer: Medicaid Other

## 2014-10-21 ENCOUNTER — Ambulatory Visit (HOSPITAL_COMMUNITY): Admission: RE | Admit: 2014-10-21 | Payer: Medicaid Other | Source: Ambulatory Visit

## 2014-10-28 ENCOUNTER — Ambulatory Visit (HOSPITAL_COMMUNITY)
Admission: RE | Admit: 2014-10-28 | Discharge: 2014-10-28 | Disposition: A | Discharge status: Home / Self Care | Payer: Medicaid Other | Source: Ambulatory Visit | Attending: Obstetrics | Admitting: Obstetrics

## 2014-10-28 ENCOUNTER — Encounter (HOSPITAL_COMMUNITY): Payer: Self-pay

## 2014-10-28 DIAGNOSIS — O280 Abnormal hematological finding on antenatal screening of mother: Secondary | ICD-10-CM | POA: Insufficient documentation

## 2014-10-28 DIAGNOSIS — Z3A19 19 weeks gestation of pregnancy: Secondary | ICD-10-CM | POA: Diagnosis not present

## 2014-10-28 DIAGNOSIS — O283 Abnormal ultrasonic finding on antenatal screening of mother: Secondary | ICD-10-CM | POA: Insufficient documentation

## 2014-10-28 DIAGNOSIS — Z36 Encounter for antenatal screening of mother: Secondary | ICD-10-CM | POA: Diagnosis not present

## 2014-10-28 DIAGNOSIS — Z3689 Encounter for other specified antenatal screening: Secondary | ICD-10-CM | POA: Insufficient documentation

## 2014-11-04 ENCOUNTER — Encounter: Payer: Self-pay | Admitting: Obstetrics

## 2014-11-04 ENCOUNTER — Ambulatory Visit (INDEPENDENT_AMBULATORY_CARE_PROVIDER_SITE_OTHER): Payer: Medicaid Other | Admitting: Obstetrics

## 2014-11-04 VITALS — BP 123/71 | HR 115 | Temp 98.5°F | Wt 162.6 lb

## 2014-11-04 DIAGNOSIS — Z3482 Encounter for supervision of other normal pregnancy, second trimester: Secondary | ICD-10-CM

## 2014-11-04 NOTE — Progress Notes (Signed)
Subjective:    Sandra Anderson is a 24 y.o. female being seen today for her obstetrical visit. She is at 7018w0d gestation. Patient reports: no complaints . Fetal movement: normal.  Problem List Items Addressed This Visit    None    Visit Diagnoses    Encounter for supervision of other normal pregnancy in second trimester    -  Primary    Relevant Orders    POCT urinalysis dipstick      Patient Active Problem List   Diagnosis Date Noted  . [redacted] weeks gestation of pregnancy   . Abnormal quad screen   . Encounter for fetal anatomic survey   . Abnormal MSAFP (maternal serum alpha-fetoprotein), decreased   . IUGR (intrauterine growth restriction) in prior pregnancy, pregnant 04/13/2011  . Small for gestational age (SGA) 04/13/2011   Objective:    BP 123/71 mmHg  Pulse 115  Temp(Src) 98.5 F (36.9 C)  Wt 162 lb 9.6 oz (73.755 kg)  LMP 06/11/2014 FHT: 150 BPM  Uterine Size: size equals dates     Assessment:    Pregnancy @ 1018w0d    Plan:    OBGCT: discussed.  Labs, problem list reviewed and updated 2 hr GTT planned Follow up in 4 weeks.

## 2014-11-23 ENCOUNTER — Encounter: Payer: Self-pay | Admitting: Family

## 2014-11-23 ENCOUNTER — Encounter (HOSPITAL_COMMUNITY): Payer: Self-pay | Admitting: *Deleted

## 2014-11-23 ENCOUNTER — Inpatient Hospital Stay (HOSPITAL_COMMUNITY)
Admission: AD | Admit: 2014-11-23 | Discharge: 2014-11-23 | Disposition: A | Discharge status: Home / Self Care | Payer: Medicaid Other | Source: Ambulatory Visit | Attending: Obstetrics | Admitting: Obstetrics

## 2014-11-23 DIAGNOSIS — Z3A22 22 weeks gestation of pregnancy: Secondary | ICD-10-CM | POA: Diagnosis not present

## 2014-11-23 DIAGNOSIS — O212 Late vomiting of pregnancy: Secondary | ICD-10-CM | POA: Insufficient documentation

## 2014-11-23 DIAGNOSIS — O219 Vomiting of pregnancy, unspecified: Secondary | ICD-10-CM

## 2014-11-23 HISTORY — DX: Major depressive disorder, single episode, unspecified: F32.9

## 2014-11-23 HISTORY — DX: Contact with and (suspected) exposure to infections with a predominantly sexual mode of transmission: Z20.2

## 2014-11-23 HISTORY — DX: Depression, unspecified: F32.A

## 2014-11-23 LAB — URINALYSIS, ROUTINE W REFLEX MICROSCOPIC
Bilirubin Urine: NEGATIVE
GLUCOSE, UA: NEGATIVE mg/dL
Hgb urine dipstick: NEGATIVE
Ketones, ur: 15 mg/dL — AB
Nitrite: NEGATIVE
PH: 6 (ref 5.0–8.0)
Protein, ur: NEGATIVE mg/dL
Urobilinogen, UA: 0.2 mg/dL (ref 0.0–1.0)

## 2014-11-23 LAB — URINE MICROSCOPIC-ADD ON

## 2014-11-23 MED ORDER — PROMETHAZINE HCL 25 MG/ML IJ SOLN
25.0000 mg | Freq: Once | INTRAMUSCULAR | Status: AC
  Administered 2014-11-23: 25 mg via INTRAVENOUS
  Filled 2014-11-23: qty 1

## 2014-11-23 NOTE — MAU Note (Signed)
Pt reports vomiting since 2300, states she can't keep fluid or food down.

## 2014-11-23 NOTE — MAU Provider Note (Signed)
History     CSN: [id number]  Arrival date and time: 11/23/14 1901   First Provider Initiated Contact with Patient 11/23/14 1948      No chief complaint on file.  HPI Sandra Anderson 24 y.o. [redacted]w[redacted]d  Comes to MAU with repetitive vomiting since last night.  Unable to keep down food or fluids.  Ate chinese chicken wings yesterday and afterwards was up and down through the night vomiting.  Eventually the vomitus was bile colored.  Has not had any diarrhea.  Has tried some chicken noodle soup and green tea and vomited those.  Vomited last just before leaving to come to the hospital.  Was worried about the baby.  OB History    Gravida Para Term Preterm AB TAB SAB Ectopic Multiple Living   0      2      Past Medical History  Diagnosis Date  . No pertinent past medical history   . Depression   . Chlamydia contact, treated   . UTI (urinary tract infection) during pregnancy     Past Surgical History  Procedure Laterality Date  . No past surgeries      Family History  Problem Relation Age of Onset  . Hypertension Mother   . Cirrhosis Mother     History  Substance Use Topics  . Smoking status: Current Every Day Smoker -- 0.50 packs/day for 2 years    Types: Cigarettes  . Smokeless tobacco: Never Used  . Alcohol Use: No    Allergies: No Known Allergies  Prescriptions prior to admission  Medication Sig Dispense Refill Last Dose  . metroNIDAZOLE (FLAGYL) 500 MG tablet Take 1 tablet (500 mg total) by mouth 2 (two) times daily. 14 tablet 0 11/22/2014 at Unknown time  . Prenatal Vit-Fe Fumarate-FA (PRENATAL VITAMINS) 28-0.8 MG TABS Take 1 tablet by mouth daily. 30 tablet 5 Past Week at Unknown time  . butalbital-acetaminophen-caffeine (FIORICET) 50-325-40 MG per tablet Take 2 tablets by mouth every 6 (six) hours as needed for headache. (Patient not taking: Reported on 11/04/2014) 40 tablet 5 Not Taking  . doxylamine, Sleep, (UNISOM) 25 MG tablet Take 1 tablet (25 mg total) by  mouth at bedtime as needed. (Patient not taking: Reported on 10/07/2014) 30 tablet 5 Not Taking  . metoCLOPramide (REGLAN) 10 MG tablet Take 1 tablet (10 mg total) by mouth every 6 (six) hours. (Patient not taking: Reported on 08/26/2014) 30 tablet 0 Not Taking  . promethazine (PHENERGAN) 25 MG tablet Take 0.5-1 tablets (12.5-25 mg total) by mouth every 6 (six) hours as needed. (Patient not taking: Reported on 08/26/2014) 30 tablet 2 Not Taking  . pyridOXINE (VITAMIN B-6) 100 MG tablet Take 1 tablet (100 mg total) by mouth at bedtime. (Patient not taking: Reported on 09/23/2014) 30 tablet 5 Not Taking    Review of Systems  Constitutional: Negative for fever.  Gastrointestinal: Positive for nausea and vomiting. Negative for abdominal pain, diarrhea and constipation.  Genitourinary:       No vaginal discharge. No vaginal bleeding. No dysuria. No leaking.   Physical Exam   Blood pressure 100/62, pulse 111, temperature 98.2 F (36.8 C), temperature source Oral, resp. rate 16, height  (1.702 m), weight 164 lb (74.39 kg), last menstrual period 06/11/2014, SpO2 100 %.  Physical Exam  Nursing note and vitals reviewed. Constitutional: She is oriented to person, place, and time. She appears well-developed and well-nourished.  HENT:  Head: Normocephalic.  Eyes: EOM are normal.  Neck: Neck supple.  Respiratory: Effort normal.  Musculoskeletal: Normal range of motion.  Neurological: She is alert and oriented to person, place, and time.  Skin: Skin is warm and dry.  Psychiatric: She has a normal mood and affect.   FHR 151  MAU Course  Procedures Results for orders placed or performed during the hospital encounter of 11/23/14 (from the past 24 hour(s))  Urinalysis, Routine w reflex microscopic     Status: Abnormal   Collection Time: 11/23/14  7:21 PM  Result Value Ref Range   Color, Urine YELLOW YELLOW   APPearance CLEAR CLEAR   Specific Gravity, Urine >1.030 (H) 1.005 - 1.030   pH 6.0 5.0  - 8.0   Glucose, UA NEGATIVE NEGATIVE mg/dL   Hgb urine dipstick NEGATIVE NEGATIVE   Bilirubin Urine NEGATIVE NEGATIVE   Ketones, ur 15 (A) NEGATIVE mg/dL   Protein, ur NEGATIVE NEGATIVE mg/dL   Urobilinogen, UA 0.2 0.0 - 1.0 mg/dL   Nitrite NEGATIVE NEGATIVE   Leukocytes, UA TRACE (A) NEGATIVE  Urine microscopic-add on     Status: Abnormal   Collection Time: 11/23/14  7:21 PM  Result Value Ref Range   Squamous Epithelial / LPF FEW (A) RARE   WBC, UA 0-2 <3 WBC/hpf   RBC / HPF 0-2 <3 RBC/hpf   Bacteria, UA FEW (A) RARE   Urine-Other MUCOUS PRESENT     MDM Will give IVF with Phenergan for nausea and vomiting.  Care assumed by Margarita MailW. Karim, CNM at 2000.  2100 Pt reports improvement in nausea and tolerating PO fluids. Assessment and Plan  24 y.o. G3P2002 at 8189w5d IUP Nausea and vomiting  Plan: Discharge to home Advance diet as tolerated - avoid greasy, high-fat content food Keep scheduled appointment Report worsening of symptoms  Marlis EdelsonWalidah N Karim, CNM

## 2014-11-26 ENCOUNTER — Other Ambulatory Visit (HOSPITAL_COMMUNITY): Payer: Self-pay | Admitting: Maternal and Fetal Medicine

## 2014-11-26 DIAGNOSIS — O289 Unspecified abnormal findings on antenatal screening of mother: Secondary | ICD-10-CM

## 2014-11-27 NOTE — MAU Note (Signed)
Pt was here on Sunday came to ask a questions. She said she was told to get a refill on her BV medicine but the pharmacy did not have a refill order on that med.  Had D. Poe look in chart. No wet prep results to indicate she had BV. She instructed pt to call Dr. Thomes LollingHarpers office if she felt she needed more antibiotics.

## 2014-12-03 ENCOUNTER — Ambulatory Visit (INDEPENDENT_AMBULATORY_CARE_PROVIDER_SITE_OTHER): Payer: Medicaid Other | Admitting: Obstetrics

## 2014-12-03 ENCOUNTER — Encounter: Payer: Self-pay | Admitting: Obstetrics

## 2014-12-03 VITALS — BP 112/65 | HR 84 | Temp 97.6°F | Wt 166.0 lb

## 2014-12-03 DIAGNOSIS — Z3482 Encounter for supervision of other normal pregnancy, second trimester: Secondary | ICD-10-CM | POA: Diagnosis not present

## 2014-12-03 LAB — POCT URINALYSIS DIPSTICK
BILIRUBIN UA: NEGATIVE
Bilirubin, UA: NEGATIVE
Blood, UA: NEGATIVE
GLUCOSE UA: NEGATIVE
Glucose, UA: NEGATIVE
KETONES UA: NEGATIVE
Ketones, UA: NEGATIVE
Leukocytes, UA: NEGATIVE
NITRITE UA: NEGATIVE
Nitrite, UA: NEGATIVE
PROTEIN UA: NEGATIVE
PROTEIN UA: NEGATIVE
RBC UA: NEGATIVE
Spec Grav, UA: 1.01
Spec Grav, UA: 1.015
Urobilinogen, UA: NEGATIVE
Urobilinogen, UA: NEGATIVE
pH, UA: 7
pH, UA: 6

## 2014-12-03 MED ORDER — OB COMPLETE PETITE 35-5-1-200 MG PO CAPS: 1.0000 | ORAL_CAPSULE | Freq: Every day | ORAL | Status: DC

## 2014-12-03 NOTE — Progress Notes (Signed)
Subjective:    Sandra Anderson is a 24 y.o. female being seen today for her obstetrical visit. She is at 54104w1d gestation. Patient reports: no complaints . Fetal movement: normal.  Problem List Items Addressed This Visit    None    Visit Diagnoses    Encounter for supervision of other normal pregnancy in second trimester    -  Primary    Relevant Orders    POCT urinalysis dipstick      Patient Active Problem List   Diagnosis Date Noted  . Abnormal quad screen   . Encounter for fetal anatomic survey   . Abnormal MSAFP (maternal serum alpha-fetoprotein), decreased   . IUGR (intrauterine growth restriction) in prior pregnancy, pregnant 04/13/2011  . Small for gestational age (SGA) 04/13/2011   Objective:    BP 112/65 mmHg  Pulse 84  Temp(Src) 97.6 F (36.4 C)  Wt 166 lb (75.297 kg)  LMP 06/11/2014 FHT: 150 BPM  Uterine Size: size equals dates     Assessment:    Pregnancy @ 15104w1d    Plan:    OBGCT: ordered.  Labs, problem list reviewed and updated 2 hr GTT planned Follow up in 4 weeks.

## 2014-12-04 ENCOUNTER — Encounter: Payer: Self-pay | Admitting: Obstetrics

## 2014-12-06 LAB — SURESWAB, VAGINOSIS/VAGINITIS PLUS
Atopobium vaginae: 7.3 Log (cells/mL)
BV CATEGORY: UNDETERMINED — AB
C. ALBICANS, DNA: DETECTED — AB
C. TROPICALIS, DNA: NOT DETECTED
C. glabrata, DNA: NOT DETECTED
C. parapsilosis, DNA: NOT DETECTED
C. trachomatis RNA, TMA: NOT DETECTED
Gardnerella vaginalis: >8 Log (cells/mL)
LACTOBACILLUS SPECIES: 6.4 Log (cells/mL)
N. gonorrhoeae RNA, TMA: NOT DETECTED
T. vaginalis RNA, QL TMA: NOT DETECTED

## 2014-12-11 ENCOUNTER — Other Ambulatory Visit: Payer: Self-pay | Admitting: Obstetrics

## 2014-12-11 DIAGNOSIS — B373 Candidiasis of vulva and vagina: Secondary | ICD-10-CM

## 2014-12-11 DIAGNOSIS — B9689 Other specified bacterial agents as the cause of diseases classified elsewhere: Secondary | ICD-10-CM

## 2014-12-11 DIAGNOSIS — N760 Acute vaginitis: Principal | ICD-10-CM

## 2014-12-11 DIAGNOSIS — B3731 Acute candidiasis of vulva and vagina: Secondary | ICD-10-CM

## 2014-12-11 MED ORDER — TINIDAZOLE 500 MG PO TABS: 1000.0000 mg | ORAL_TABLET | Freq: Every day | ORAL | Status: DC

## 2014-12-11 MED ORDER — FLUCONAZOLE 150 MG PO TABS: 150.0000 mg | ORAL_TABLET | Freq: Once | ORAL | Status: DC

## 2014-12-30 ENCOUNTER — Ambulatory Visit (HOSPITAL_COMMUNITY)
Admission: RE | Admit: 2014-12-30 | Discharge: 2014-12-30 | Disposition: A | Discharge status: Home / Self Care | Payer: Medicaid Other | Source: Ambulatory Visit | Attending: Maternal and Fetal Medicine | Admitting: Maternal and Fetal Medicine

## 2014-12-30 DIAGNOSIS — O289 Unspecified abnormal findings on antenatal screening of mother: Secondary | ICD-10-CM

## 2014-12-30 DIAGNOSIS — O283 Abnormal ultrasonic finding on antenatal screening of mother: Secondary | ICD-10-CM | POA: Insufficient documentation

## 2014-12-30 DIAGNOSIS — Z3A28 28 weeks gestation of pregnancy: Secondary | ICD-10-CM | POA: Insufficient documentation

## 2014-12-31 DIAGNOSIS — O289 Unspecified abnormal findings on antenatal screening of mother: Secondary | ICD-10-CM | POA: Insufficient documentation

## 2014-12-31 DIAGNOSIS — Z3A28 28 weeks gestation of pregnancy: Secondary | ICD-10-CM | POA: Insufficient documentation

## 2015-01-01 ENCOUNTER — Encounter: Payer: Medicaid Other | Admitting: Obstetrics

## 2015-01-01 ENCOUNTER — Other Ambulatory Visit: Payer: Medicaid Other

## 2015-01-02 ENCOUNTER — Other Ambulatory Visit: Payer: Medicaid Other

## 2015-01-02 ENCOUNTER — Encounter: Payer: Self-pay | Admitting: Obstetrics

## 2015-01-02 ENCOUNTER — Ambulatory Visit (INDEPENDENT_AMBULATORY_CARE_PROVIDER_SITE_OTHER): Payer: Medicaid Other | Admitting: Obstetrics

## 2015-01-02 VITALS — BP 109/73 | HR 85 | Temp 97.3°F | Wt 176.0 lb

## 2015-01-02 DIAGNOSIS — Z3483 Encounter for supervision of other normal pregnancy, third trimester: Secondary | ICD-10-CM

## 2015-01-02 LAB — RPR

## 2015-01-02 LAB — CBC
HEMATOCRIT: 33.2 % — AB (ref 36.0–46.0)
Hemoglobin: 10.8 g/dL — ABNORMAL LOW (ref 12.0–15.0)
MCH: 27.6 pg (ref 26.0–34.0)
MCHC: 32.5 g/dL (ref 30.0–36.0)
MCV: 84.9 fL (ref 78.0–100.0)
MPV: 9.9 fL (ref 8.6–12.4)
PLATELETS: 234 10*3/uL (ref 150–400)
RBC: 3.91 MIL/uL (ref 3.87–5.11)
RDW: 14.8 % (ref 11.5–15.5)
WBC: 11.7 10*3/uL — ABNORMAL HIGH (ref 4.0–10.5)

## 2015-01-02 NOTE — Progress Notes (Signed)
Subjective:    Julyanna Heichel is a 24 y.o. female being seen today for her obstetrical visit. She is at [redacted]w[redacted]d gestation. Patient reports no complaints. Fetal movement: normal.  Problem List Items Addressed This Visit    None    Visit Diagnoses    Encounter for supervision of other normal pregnancy in second trimester    -  Primary    Relevant Orders    POCT urinalysis dipstick    Glucose Tolerance, 2 Hours w/1 Hour    CBC    HIV antibody    RPR      Patient Active Problem List   Diagnosis Date Noted  . [redacted] weeks gestation of pregnancy   . Abnormal findings on antenatal screening   . Abnormal quad screen   . Encounter for fetal anatomic survey   . Abnormal MSAFP (maternal serum alpha-fetoprotein), decreased   . IUGR (intrauterine growth restriction) in prior pregnancy, pregnant 04/13/2011  . Small for gestational age (SGA) 04/13/2011   Objective:    BP 109/73 mmHg  Pulse 85  Temp(Src) 97.3 F (36.3 C)  Wt 176 lb (79.833 kg)  LMP 06/11/2014 FHT:  150 BPM  Uterine Size: size equals dates  Presentation: unsure     Assessment:    Pregnancy @ [redacted]w[redacted]d weeks   Plan:     labs reviewed, problem list updated Consent signed. GBS sent TDAP offered  Rhogam given for RH negative Pediatrician: discussed. Infant feeding: plans to breastfeed. Maternity leave: discussed. Cigarette smoking: smokes 0.5 PPD. Orders Placed This Encounter  Procedures  . Glucose Tolerance, 2 Hours w/1 Hour  . CBC  . HIV antibody  . RPR  . POCT urinalysis dipstick   No orders of the defined types were placed in this encounter.   Follow up in 2 Weeks.

## 2015-01-03 LAB — HIV ANTIBODY (ROUTINE TESTING W REFLEX): HIV 1&2 Ab, 4th Generation: NONREACTIVE

## 2015-01-03 LAB — GLUCOSE TOLERANCE, 2 HOURS W/ 1HR
GLUCOSE, 2 HOUR: 91 mg/dL (ref 70–139)
GLUCOSE, FASTING: 76 mg/dL (ref 70–99)
Glucose, 1 hour: 89 mg/dL (ref 70–170)

## 2015-01-06 DIAGNOSIS — O2690 Pregnancy related conditions, unspecified, unspecified trimester: Secondary | ICD-10-CM | POA: Diagnosis not present

## 2015-01-15 ENCOUNTER — Encounter: Payer: Self-pay | Admitting: Obstetrics

## 2015-01-15 ENCOUNTER — Ambulatory Visit (INDEPENDENT_AMBULATORY_CARE_PROVIDER_SITE_OTHER): Payer: Medicaid Other | Admitting: Obstetrics

## 2015-01-15 VITALS — BP 121/73 | HR 87 | Temp 98.2°F | Wt 177.0 lb

## 2015-01-15 DIAGNOSIS — Z3483 Encounter for supervision of other normal pregnancy, third trimester: Secondary | ICD-10-CM | POA: Diagnosis not present

## 2015-01-15 LAB — POCT URINALYSIS DIPSTICK
Bilirubin, UA: NEGATIVE
Blood, UA: NEGATIVE
GLUCOSE UA: NEGATIVE
Ketones, UA: NEGATIVE
Leukocytes, UA: NEGATIVE
NITRITE UA: NEGATIVE
PROTEIN UA: NEGATIVE
SPEC GRAV UA: 1.015
UROBILINOGEN UA: NEGATIVE
pH, UA: 6.5

## 2015-01-15 NOTE — Progress Notes (Signed)
Subjective:    Sandra Anderson is a 24 y.o. female being seen today for her obstetrical visit. She is at [redacted]w[redacted]d gestation. Patient reports no complaints. Fetal movement: normal.  Problem List Items Addressed This Visit    None    Visit Diagnoses    Encounter for supervision of other normal pregnancy in third trimester    -  Primary    Relevant Orders    POCT urinalysis dipstick (Completed)      Patient Active Problem List   Diagnosis Date Noted  . [redacted] weeks gestation of pregnancy   . Abnormal findings on antenatal screening   . Abnormal quad screen   . Encounter for fetal anatomic survey   . Abnormal MSAFP (maternal serum alpha-fetoprotein), decreased   . IUGR (intrauterine growth restriction) in prior pregnancy, pregnant 04/13/2011  . Small for gestational age (SGA) 04/13/2011   Objective:    BP 121/73 mmHg  Pulse 87  Temp(Src) 98.2 F (36.8 C)  Wt 177 lb (80.287 kg)  LMP 06/11/2014 FHT:  150 BPM  Uterine Size: size equals dates  Presentation: unsure     Assessment:    Pregnancy @ [redacted]w[redacted]d weeks   Plan:     labs reviewed, problem list updated Consent signed. GBS sent TDAP offered  Rhogam given for RH negative Pediatrician: discussed. Infant feeding: plans to breastfeed. Maternity leave: discussed. Cigarette smoking: smokes 0.5 PPD. Orders Placed This Encounter  Procedures  . POCT urinalysis dipstick   No orders of the defined types were placed in this encounter.   Follow up in 2 Weeks.

## 2015-01-21 ENCOUNTER — Other Ambulatory Visit: Payer: Self-pay | Admitting: Certified Nurse Midwife

## 2015-01-29 ENCOUNTER — Telehealth: Payer: Self-pay | Admitting: Obstetrics

## 2015-01-29 ENCOUNTER — Encounter: Payer: Medicaid Other | Admitting: Obstetrics

## 2015-01-29 NOTE — Telephone Encounter (Signed)
01/29/2015 - Left patient a vm to please call adn reschedule her missed ROB appt. For today. brm

## 2015-02-02 ENCOUNTER — Ambulatory Visit (INDEPENDENT_AMBULATORY_CARE_PROVIDER_SITE_OTHER): Payer: Medicaid Other | Admitting: Obstetrics

## 2015-02-02 VITALS — BP 120/74 | HR 99 | Temp 98.0°F | Wt 177.0 lb

## 2015-02-02 DIAGNOSIS — Z3483 Encounter for supervision of other normal pregnancy, third trimester: Secondary | ICD-10-CM

## 2015-02-03 ENCOUNTER — Encounter: Payer: Self-pay | Admitting: Obstetrics

## 2015-02-03 NOTE — Telephone Encounter (Signed)
[id number] - PATIENT MADE APPT.

## 2015-02-03 NOTE — Progress Notes (Signed)
Subjective:    Sandra Anderson is a [age] y.o. female being seen today for her obstetrical visit. She is at 5454w0d gestation. Patient reports no complaints. Fetal movement: normal.  Problem List Items Addressed This Visit    None    Visit Diagnoses    Encounter for supervision of other normal pregnancy in third trimester    -  Primary    Relevant Orders    POCT urinalysis dipstick      Patient Active Problem List   Diagnosis Date Noted  . [redacted] weeks gestation of pregnancy   . Abnormal findings on antenatal screening   . Abnormal quad screen   . Encounter for fetal anatomic survey   . Abnormal MSAFP (maternal serum alpha-fetoprotein), decreased   . IUGR (intrauterine growth restriction) in prior pregnancy, pregnant 04/13/2011  . Small for gestational age (SGA) 04/13/2011   Objective:    BP 120/74 mmHg  Pulse 99  Temp(Src) 98 F (36.7 C)  Wt 177 lb (80.287 kg)  LMP 06/11/2014 FHT:  150 BPM  Uterine Size: size equals dates  Presentation: unsure     Assessment:    Pregnancy @ 6954w0d weeks   Plan:     labs reviewed, problem list updated Consent signed. GBS sent TDAP offered  Rhogam given for RH negative Pediatrician: discussed. Infant feeding: plans to breastfeed. Maternity leave: discussed. Cigarette smoking: smokes 0.5 PPD. Orders Placed This Encounter  Procedures  . POCT urinalysis dipstick   No orders of the defined types were placed in this encounter.   Follow up in 2 Weeks.

## 2015-02-05 ENCOUNTER — Other Ambulatory Visit (HOSPITAL_COMMUNITY): Payer: Self-pay | Admitting: Maternal and Fetal Medicine

## 2015-02-05 DIAGNOSIS — O289 Unspecified abnormal findings on antenatal screening of mother: Secondary | ICD-10-CM

## 2015-02-05 DIAGNOSIS — Z3A34 34 weeks gestation of pregnancy: Secondary | ICD-10-CM

## 2015-02-10 ENCOUNTER — Encounter (HOSPITAL_COMMUNITY): Payer: Self-pay

## 2015-02-10 ENCOUNTER — Ambulatory Visit (HOSPITAL_COMMUNITY)
Admission: RE | Admit: 2015-02-10 | Discharge: 2015-02-10 | Disposition: A | Discharge status: Home / Self Care | Payer: Medicaid Other | Source: Ambulatory Visit | Attending: Maternal and Fetal Medicine | Admitting: Maternal and Fetal Medicine

## 2015-02-10 DIAGNOSIS — O289 Unspecified abnormal findings on antenatal screening of mother: Secondary | ICD-10-CM

## 2015-02-10 DIAGNOSIS — Z3A34 34 weeks gestation of pregnancy: Secondary | ICD-10-CM

## 2015-02-10 DIAGNOSIS — O283 Abnormal ultrasonic finding on antenatal screening of mother: Secondary | ICD-10-CM | POA: Insufficient documentation

## 2015-02-10 DIAGNOSIS — O09899 Supervision of other high risk pregnancies, unspecified trimester: Secondary | ICD-10-CM | POA: Insufficient documentation

## 2015-02-10 DIAGNOSIS — O288 Other abnormal findings on antenatal screening of mother: Secondary | ICD-10-CM

## 2015-02-16 ENCOUNTER — Encounter: Payer: Medicaid Other | Admitting: Obstetrics

## 2015-02-17 ENCOUNTER — Encounter: Payer: Medicaid Other | Admitting: Obstetrics

## 2015-02-18 ENCOUNTER — Encounter: Payer: Self-pay | Admitting: Obstetrics

## 2015-02-18 ENCOUNTER — Ambulatory Visit (INDEPENDENT_AMBULATORY_CARE_PROVIDER_SITE_OTHER): Payer: Medicaid Other | Admitting: Obstetrics

## 2015-02-18 VITALS — BP 118/68 | HR 91 | Temp 98.6°F | Wt 180.0 lb

## 2015-02-18 DIAGNOSIS — Z3483 Encounter for supervision of other normal pregnancy, third trimester: Secondary | ICD-10-CM

## 2015-02-18 NOTE — Progress Notes (Signed)
Subjective:    Sandra Anderson is a 24 y.o. female being seen today for her obstetrical visit. She is at [redacted]w[redacted]d gestation. Patient reports no complaints. Fetal movement: normal.  Problem List Items Addressed This Visit    None    Visit Diagnoses    Encounter for supervision of other normal pregnancy in third trimester    -  Primary    Relevant Orders    POCT urinalysis dipstick    Strep B DNA probe      Patient Active Problem List   Diagnosis Date Noted  . [redacted] weeks gestation of pregnancy   . High risk pregnancy with high inhibin   . [redacted] weeks gestation of pregnancy   . Abnormal findings on antenatal screening   . Abnormal quad screen   . Encounter for fetal anatomic survey   . Abnormal MSAFP (maternal serum alpha-fetoprotein), decreased   . IUGR (intrauterine growth restriction) in prior pregnancy, pregnant 04/13/2011  . Small for gestational age (SGA) 04/13/2011   Objective:    BP 118/68 mmHg  Pulse 91  Temp(Src) 98.6 F (37 C)  Wt 180 lb (81.647 kg)  LMP 06/11/2014 FHT:  150 BPM  Uterine Size: size equals dates  Presentation: unsure     Assessment:    Pregnancy @ [redacted]w[redacted]d weeks   Plan:     labs reviewed, problem list updated Consent signed. GBS sent TDAP offered  Rhogam given for RH negative Pediatrician: discussed. Infant feeding: plans to breastfeed. Maternity leave: discussed. Cigarette smoking: smokes 0.5 PPD. Orders Placed This Encounter  Procedures  . Strep B DNA probe  . POCT urinalysis dipstick   No orders of the defined types were placed in this encounter.   Follow up in 1 Week.

## 2015-02-19 LAB — STREP B DNA PROBE: STREP GROUP B AG: NOT DETECTED

## 2015-02-25 ENCOUNTER — Ambulatory Visit (INDEPENDENT_AMBULATORY_CARE_PROVIDER_SITE_OTHER): Payer: Medicaid Other | Admitting: Obstetrics

## 2015-02-25 ENCOUNTER — Encounter: Payer: Self-pay | Admitting: Obstetrics

## 2015-02-25 VITALS — BP 118/71 | HR 98 | Temp 97.9°F | Wt 178.6 lb

## 2015-02-25 DIAGNOSIS — Z3483 Encounter for supervision of other normal pregnancy, third trimester: Secondary | ICD-10-CM

## 2015-02-25 NOTE — Progress Notes (Signed)
Subjective:    Sandra Anderson is a 24 y.o. female being seen today for her obstetrical visit. She is at [redacted]w[redacted]d gestation. Patient reports no complaints. Fetal movement: normal.  Problem List Items Addressed This Visit    None     Patient Active Problem List   Diagnosis Date Noted  . [redacted] weeks gestation of pregnancy   . High risk pregnancy with high inhibin   . [redacted] weeks gestation of pregnancy   . Abnormal findings on antenatal screening   . Abnormal quad screen   . Encounter for fetal anatomic survey   . Abnormal MSAFP (maternal serum alpha-fetoprotein), decreased   . IUGR (intrauterine growth restriction) in prior pregnancy, pregnant 04/13/2011  . Small for gestational age (SGA) 04/13/2011   Objective:    BP 118/71 mmHg  Pulse 98  Temp(Src) 97.9 F (36.6 C)  Wt 178 lb 9.6 oz (81.012 kg)  LMP 06/11/2014 FHT:  150 BPM  Uterine Size: size equals dates  Presentation: cephalic     Assessment:    Pregnancy @ [redacted]w[redacted]d weeks   Plan:     labs reviewed, problem list updated Consent signed. GBS sent TDAP offered  Rhogam given for RH negative Pediatrician: discussed. Infant feeding: plans to breastfeed. Maternity leave: discussed. Cigarette smoking: smokes 0.5 PPD. No orders of the defined types were placed in this encounter.   No orders of the defined types were placed in this encounter.   Follow up in 1 Week.

## 2015-03-04 ENCOUNTER — Encounter: Payer: Medicaid Other | Admitting: Obstetrics

## 2015-03-11 ENCOUNTER — Ambulatory Visit (INDEPENDENT_AMBULATORY_CARE_PROVIDER_SITE_OTHER): Payer: Medicaid Other | Admitting: Obstetrics

## 2015-03-11 VITALS — BP 138/76 | HR 103 | Temp 98.1°F | Wt 186.0 lb

## 2015-03-11 DIAGNOSIS — Z3483 Encounter for supervision of other normal pregnancy, third trimester: Secondary | ICD-10-CM

## 2015-03-11 LAB — POCT URINALYSIS DIPSTICK
Bilirubin, UA: NEGATIVE
Bilirubin, UA: NEGATIVE
Blood, UA: NEGATIVE
GLUCOSE UA: NORMAL
Glucose, UA: NEGATIVE
Ketones, UA: NEGATIVE
Ketones, UA: NEGATIVE
NITRITE UA: NEGATIVE
NITRITE UA: NEGATIVE
PH UA: 6.5
PH UA: 6
RBC UA: NEGATIVE
Spec Grav, UA: 1.02
Spec Grav, UA: 1.02
UROBILINOGEN UA: NEGATIVE
UROBILINOGEN UA: NEGATIVE

## 2015-03-12 ENCOUNTER — Encounter: Payer: Self-pay | Admitting: Obstetrics

## 2015-03-12 ENCOUNTER — Encounter (HOSPITAL_COMMUNITY): Payer: Self-pay | Admitting: *Deleted

## 2015-03-12 ENCOUNTER — Telehealth (HOSPITAL_COMMUNITY): Payer: Self-pay | Admitting: *Deleted

## 2015-03-12 NOTE — Progress Notes (Signed)
Subjective:    Sandra Anderson is a 24 y.o. female being seen today for her obstetrical visit. She is at [redacted]w[redacted]d gestation. Patient reports no complaints. Fetal movement: normal.  Problem List Items Addressed This Visit    None    Visit Diagnoses    Encounter for supervision of other normal pregnancy in third trimester    -  Primary    Relevant Orders    POCT urinalysis dipstick (Completed)    POCT urinalysis dipstick (Completed)      Patient Active Problem List   Diagnosis Date Noted  . [redacted] weeks gestation of pregnancy   . High risk pregnancy with high inhibin   . [redacted] weeks gestation of pregnancy   . Abnormal findings on antenatal screening   . Abnormal quad screen   . Encounter for fetal anatomic survey   . Abnormal MSAFP (maternal serum alpha-fetoprotein), decreased   . IUGR (intrauterine growth restriction) in prior pregnancy, pregnant 04/13/2011  . Small for gestational age (SGA) 04/13/2011    Objective:    BP 138/76 mmHg  Pulse 103  Temp(Src) 98.1 F (36.7 C)  Wt 186 lb (84.369 kg)  LMP 06/11/2014 FHT: 150 BPM  Uterine Size: size equals dates  Presentations: unsure    Assessment:    Pregnancy @ [redacted]w[redacted]d weeks   Plan:   Plans for delivery: Vaginal anticipated; labs reviewed; problem list updated Counseling: Consent signed. Infant feeding: plans to breastfeed. Cigarette smoking: smokes 0.5 PPD. L&D discussion: symptoms of labor, discussed when to call, discussed what number to call, anesthetic/analgesic options reviewed and delivering clinician:  plans no preference. Postpartum supports and preparation: circumcision discussed and contraception plans discussed.  Follow up in 1 Week.

## 2015-03-12 NOTE — Telephone Encounter (Signed)
Preadmission screen  

## 2015-03-18 ENCOUNTER — Encounter: Payer: Medicaid Other | Admitting: Obstetrics

## 2015-03-18 ENCOUNTER — Inpatient Hospital Stay (HOSPITAL_COMMUNITY): Admission: RE | Admit: 2015-03-18 | Payer: Medicaid Other | Source: Ambulatory Visit

## 2015-03-19 ENCOUNTER — Inpatient Hospital Stay (HOSPITAL_COMMUNITY): Payer: Medicaid Other | Admitting: Anesthesiology

## 2015-03-19 ENCOUNTER — Encounter (HOSPITAL_COMMUNITY): Payer: Self-pay

## 2015-03-19 ENCOUNTER — Inpatient Hospital Stay (HOSPITAL_COMMUNITY)
Admission: AD | Admit: 2015-03-19 | Discharge: 2015-03-21 | DRG: 775 | Disposition: A | Discharge status: Home / Self Care | Payer: Medicaid Other | Source: Ambulatory Visit | Attending: Obstetrics | Admitting: Obstetrics

## 2015-03-19 DIAGNOSIS — Z8249 Family history of ischemic heart disease and other diseases of the circulatory system: Secondary | ICD-10-CM

## 2015-03-19 DIAGNOSIS — Z87891 Personal history of nicotine dependence: Secondary | ICD-10-CM | POA: Diagnosis not present

## 2015-03-19 DIAGNOSIS — O9989 Other specified diseases and conditions complicating pregnancy, childbirth and the puerperium: Secondary | ICD-10-CM | POA: Diagnosis present

## 2015-03-19 DIAGNOSIS — Z3A39 39 weeks gestation of pregnancy: Secondary | ICD-10-CM | POA: Diagnosis present

## 2015-03-19 DIAGNOSIS — Z3490 Encounter for supervision of normal pregnancy, unspecified, unspecified trimester: Secondary | ICD-10-CM

## 2015-03-19 LAB — ABO/RH: ABO/RH(D): O POS

## 2015-03-19 LAB — CBC
HEMATOCRIT: 32.2 % — AB (ref 36.0–46.0)
Hemoglobin: 10.5 g/dL — ABNORMAL LOW (ref 12.0–15.0)
MCH: 27.5 pg (ref 26.0–34.0)
MCHC: 32.6 g/dL (ref 30.0–36.0)
MCV: 84.3 fL (ref 78.0–100.0)
Platelets: 235 10*3/uL (ref 150–400)
RBC: 3.82 MIL/uL — ABNORMAL LOW (ref 3.87–5.11)
RDW: 14.4 % (ref 11.5–15.5)
WBC: 11.8 10*3/uL — AB (ref 4.0–10.5)

## 2015-03-19 LAB — TYPE AND SCREEN
ABO/RH(D): O POS
Antibody Screen: NEGATIVE

## 2015-03-19 LAB — RPR: RPR Ser Ql: NONREACTIVE

## 2015-03-19 MED ORDER — LACTATED RINGERS IV SOLN
INTRAVENOUS | Status: DC
  Administered 2015-03-19: 01:00:00 via INTRAVENOUS

## 2015-03-19 MED ORDER — PROMETHAZINE HCL 25 MG/ML IJ SOLN
25.0000 mg | Freq: Four times a day (QID) | INTRAMUSCULAR | Status: DC | PRN
  Administered 2015-03-19: 25 mg via INTRAMUSCULAR
  Filled 2015-03-19: qty 1

## 2015-03-19 MED ORDER — FENTANYL 2.5 MCG/ML BUPIVACAINE 1/10 % EPIDURAL INFUSION (WH - ANES)
14.0000 mL/h | INTRAMUSCULAR | Status: DC | PRN
  Administered 2015-03-19: 14 mL/h via EPIDURAL
  Filled 2015-03-19: qty 125

## 2015-03-19 MED ORDER — TERBUTALINE SULFATE 1 MG/ML IJ SOLN
0.2500 mg | Freq: Once | INTRAMUSCULAR | Status: DC | PRN
  Filled 2015-03-19: qty 1

## 2015-03-19 MED ORDER — ZOLPIDEM TARTRATE 5 MG PO TABS: 5.0000 mg | ORAL_TABLET | Freq: Every evening | ORAL | Status: DC | PRN

## 2015-03-19 MED ORDER — ONDANSETRON HCL 4 MG PO TABS: 4.0000 mg | ORAL_TABLET | ORAL | Status: DC | PRN

## 2015-03-19 MED ORDER — ONDANSETRON HCL 4 MG/2ML IJ SOLN: 4.0000 mg | INTRAMUSCULAR | Status: DC | PRN

## 2015-03-19 MED ORDER — LACTATED RINGERS IV SOLN: 500.0000 mL | INTRAVENOUS | Status: DC | PRN

## 2015-03-19 MED ORDER — TETANUS-DIPHTH-ACELL PERTUSSIS 5-2.5-18.5 LF-MCG/0.5 IM SUSP: 0.5000 mL | Freq: Once | INTRAMUSCULAR | Status: DC

## 2015-03-19 MED ORDER — BENZOCAINE-MENTHOL 20-0.5 % EX AERO
1.0000 "application " | INHALATION_SPRAY | CUTANEOUS | Status: DC | PRN
  Filled 2015-03-19: qty 56

## 2015-03-19 MED ORDER — OXYCODONE-ACETAMINOPHEN 5-325 MG PO TABS
1.0000 | ORAL_TABLET | ORAL | Status: DC | PRN
  Administered 2015-03-20 – 2015-03-21 (×3): 1 via ORAL
  Filled 2015-03-19 (×5): qty 1

## 2015-03-19 MED ORDER — NALBUPHINE HCL 10 MG/ML IJ SOLN
10.0000 mg | Freq: Four times a day (QID) | INTRAMUSCULAR | Status: DC | PRN
  Administered 2015-03-19: 10 mg via INTRAMUSCULAR
  Filled 2015-03-19: qty 1

## 2015-03-19 MED ORDER — PRENATAL MULTIVITAMIN CH
1.0000 | ORAL_TABLET | Freq: Every day | ORAL | Status: DC
  Administered 2015-03-20 – 2015-03-21 (×2): 1 via ORAL
  Filled 2015-03-19 (×2): qty 1

## 2015-03-19 MED ORDER — OXYCODONE-ACETAMINOPHEN 5-325 MG PO TABS
2.0000 | ORAL_TABLET | ORAL | Status: DC | PRN
  Administered 2015-03-20 – 2015-03-21 (×2): 2 via ORAL
  Filled 2015-03-19 (×2): qty 2

## 2015-03-19 MED ORDER — DIBUCAINE 1 % RE OINT: 1.0000 "application " | TOPICAL_OINTMENT | RECTAL | Status: DC | PRN

## 2015-03-19 MED ORDER — OXYTOCIN 40 UNITS IN LACTATED RINGERS INFUSION - SIMPLE MED: 62.5000 mL/h | INTRAVENOUS | Status: DC | PRN

## 2015-03-19 MED ORDER — SIMETHICONE 80 MG PO CHEW
80.0000 mg | CHEWABLE_TABLET | ORAL | Status: DC | PRN
  Filled 2015-03-19: qty 1

## 2015-03-19 MED ORDER — OXYCODONE-ACETAMINOPHEN 5-325 MG PO TABS: 1.0000 | ORAL_TABLET | ORAL | Status: DC | PRN

## 2015-03-19 MED ORDER — FLEET ENEMA 7-19 GM/118ML RE ENEM: 1.0000 | ENEMA | RECTAL | Status: DC | PRN

## 2015-03-19 MED ORDER — IBUPROFEN 600 MG PO TABS
600.0000 mg | ORAL_TABLET | Freq: Four times a day (QID) | ORAL | Status: DC
  Administered 2015-03-19 – 2015-03-21 (×7): 600 mg via ORAL
  Filled 2015-03-19 (×8): qty 1

## 2015-03-19 MED ORDER — PHENYLEPHRINE 40 MCG/ML (10ML) SYRINGE FOR IV PUSH (FOR BLOOD PRESSURE SUPPORT)
80.0000 ug | PREFILLED_SYRINGE | INTRAVENOUS | Status: DC | PRN
  Filled 2015-03-19: qty 2
  Filled 2015-03-19: qty 20

## 2015-03-19 MED ORDER — MISOPROSTOL 25 MCG QUARTER TABLET
25.0000 ug | ORAL_TABLET | ORAL | Status: DC | PRN
  Administered 2015-03-19 (×2): 25 ug via VAGINAL
  Filled 2015-03-19 (×2): qty 0.25
  Filled 2015-03-19: qty 1

## 2015-03-19 MED ORDER — OXYTOCIN 40 UNITS IN LACTATED RINGERS INFUSION - SIMPLE MED
1.0000 m[IU]/min | INTRAVENOUS | Status: DC
Start: 2015-03-19 — End: 2015-03-19

## 2015-03-19 MED ORDER — LANOLIN HYDROUS EX OINT: TOPICAL_OINTMENT | CUTANEOUS | Status: DC | PRN

## 2015-03-19 MED ORDER — ACETAMINOPHEN 325 MG PO TABS: 650.0000 mg | ORAL_TABLET | ORAL | Status: DC | PRN

## 2015-03-19 MED ORDER — EPHEDRINE 5 MG/ML INJ
10.0000 mg | INTRAVENOUS | Status: DC | PRN
  Filled 2015-03-19: qty 2

## 2015-03-19 MED ORDER — DIPHENHYDRAMINE HCL 25 MG PO CAPS: 25.0000 mg | ORAL_CAPSULE | Freq: Four times a day (QID) | ORAL | Status: DC | PRN

## 2015-03-19 MED ORDER — LIDOCAINE HCL (PF) 1 % IJ SOLN
30.0000 mL | INTRAMUSCULAR | Status: DC | PRN
  Filled 2015-03-19: qty 30

## 2015-03-19 MED ORDER — ONDANSETRON HCL 4 MG/2ML IJ SOLN
4.0000 mg | Freq: Four times a day (QID) | INTRAMUSCULAR | Status: DC | PRN
Start: 2015-03-19 — End: 2015-03-19

## 2015-03-19 MED ORDER — MISOPROSTOL 200 MCG PO TABS
50.0000 ug | ORAL_TABLET | ORAL | Status: DC
  Administered 2015-03-19: 50 ug via ORAL
  Filled 2015-03-19: qty 0.5

## 2015-03-19 MED ORDER — OXYTOCIN BOLUS FROM INFUSION: 500.0000 mL | INTRAVENOUS | Status: DC

## 2015-03-19 MED ORDER — WITCH HAZEL-GLYCERIN EX PADS: 1.0000 "application " | MEDICATED_PAD | CUTANEOUS | Status: DC | PRN

## 2015-03-19 MED ORDER — OXYTOCIN 40 UNITS IN LACTATED RINGERS INFUSION - SIMPLE MED
62.5000 mL/h | INTRAVENOUS | Status: DC
  Administered 2015-03-19: 62.5 mL/h via INTRAVENOUS
  Filled 2015-03-19: qty 1000

## 2015-03-19 MED ORDER — OXYCODONE-ACETAMINOPHEN 5-325 MG PO TABS: 2.0000 | ORAL_TABLET | ORAL | Status: DC | PRN

## 2015-03-19 MED ORDER — DIPHENHYDRAMINE HCL 50 MG/ML IJ SOLN: 12.5000 mg | INTRAMUSCULAR | Status: DC | PRN

## 2015-03-19 MED ORDER — LIDOCAINE HCL (PF) 1 % IJ SOLN
INTRAMUSCULAR | Status: DC | PRN
  Administered 2015-03-19 (×2): 4 mL via EPIDURAL

## 2015-03-19 MED ORDER — CITRIC ACID-SODIUM CITRATE 334-500 MG/5ML PO SOLN: 30.0000 mL | ORAL | Status: DC | PRN

## 2015-03-19 MED ORDER — NALBUPHINE HCL 10 MG/ML IJ SOLN
10.0000 mg | INTRAMUSCULAR | Status: DC | PRN
  Administered 2015-03-19 (×3): 10 mg via INTRAVENOUS
  Filled 2015-03-19 (×3): qty 1

## 2015-03-19 MED ORDER — FENTANYL CITRATE (PF) 100 MCG/2ML IJ SOLN
100.0000 ug | INTRAMUSCULAR | Status: DC | PRN
  Administered 2015-03-19: 100 ug via INTRAVENOUS
  Filled 2015-03-19: qty 2

## 2015-03-19 MED ORDER — SENNOSIDES-DOCUSATE SODIUM 8.6-50 MG PO TABS
2.0000 | ORAL_TABLET | ORAL | Status: DC
  Administered 2015-03-20 – 2015-03-21 (×2): 2 via ORAL
  Filled 2015-03-19 (×2): qty 2

## 2015-03-19 NOTE — Anesthesia Procedure Notes (Signed)
Epidural Patient location during procedure: OB  Staffing Anesthesiologist: Paarth Cropper Performed by: anesthesiologist   Preanesthetic Checklist Completed: patient identified, site marked, surgical consent, pre-op evaluation, timeout performed, IV checked, risks and benefits discussed and monitors and equipment checked  Epidural Patient position: sitting Prep: site prepped and draped and DuraPrep Patient monitoring: continuous pulse ox and blood pressure Approach: midline Location: L3-L4 Injection technique: LOR saline  Needle:  Needle type: Tuohy  Needle gauge: 17 G Needle length: 9 cm and 9 Needle insertion depth: 5 cm cm Catheter type: closed end flexible Catheter size: 19 Gauge Catheter at skin depth: 9 cm Test dose: negative  Assessment Events: blood not aspirated, injection not painful, no injection resistance, negative IV test and no paresthesia  Additional Notes Patient identified. Risks/Benefits/Options discussed with patient including but not limited to bleeding, infection, nerve damage, paralysis, failed block, incomplete pain control, headache, blood pressure changes, nausea, vomiting, reactions to medications, itching and postpartum back pain. Confirmed with bedside nurse the patient's most recent platelet count. Confirmed with patient that they are not currently taking any anticoagulation, have any bleeding history or any family history of bleeding disorders. Patient expressed understanding and wished to proceed. All questions were answered. Sterile technique was used throughout the entire procedure. Please see nursing notes for vital signs. Test dose was given through epidural catheter and negative prior to continuing to dose epidural or start infusion. Warning signs of high block given to the patient including shortness of breath, tingling/numbness in hands, complete motor block, or any concerning symptoms with instructions to call for help. Patient was given  instructions on fall risk and not to get out of bed. All questions and concerns addressed with instructions to call with any issues or inadequate analgesia.      

## 2015-03-19 NOTE — Progress Notes (Signed)
Sandra Anderson is a 24 y.o. G3P2002 at [redacted]w[redacted]d by LMP admitted for induction of labor due to Elective at term.  Subjective:   Objective: BP 122/78 mmHg  Pulse 83  Temp(Src) 97.8 F (36.6 C) (Oral)  Resp 18  Ht  (1.702 m)  Wt 189 lb (85.73 kg)  BMI 29.59 kg/m2  LMP 06/11/2014      FHT:  FHR: 145 bpm, variability: moderate,  accelerations:  Present,  decelerations:  Absent UC:   regular, every 2-3 minutes SVE:   Dilation: 2.5 Effacement (%): 50 Station: -2 Exam by:: denney, cnm  Labs: Lab Results  Component Value Date   WBC 11.8* 03/19/2015   HGB 10.5* 03/19/2015   HCT 32.2* 03/19/2015   MCV 84.3 03/19/2015   PLT 235 03/19/2015    Assessment / Plan: Induction of labor due to elective at term,  progressing well on pitocin  Labor: Latent phase Preeclampsia:  n/a Fetal Wellbeing:  Category I Pain Control:  Epidural I/D:  n/a Anticipated MOD:  NSVD  HARPER,CHARLES A 03/19/2015, 8:56 AM

## 2015-03-19 NOTE — Progress Notes (Signed)
Sandra Anderson is a 24 y.o. G3P2002 at [redacted]w[redacted]d by LMP admitted for induction of labor due to Elective at term.  Subjective:   Objective: BP 121/69 mmHg  Pulse 68  Temp(Src) 97.9 F (36.6 C) (Oral)  Resp 18  Ht  (1.702 m)  Wt 189 lb (85.73 kg)  BMI 29.59 kg/m2  LMP 06/11/2014      FHT:  FHR: 125 bpm, variability: moderate,  accelerations:  Present,  decelerations:  Absent UC:   regular, every 2-4 minutes SVE:   Dilation: 6 Effacement (%): 60 Station: -2 Exam by:: S Nix RN  Labs: Lab Results  Component Value Date   WBC 11.8* 03/19/2015   HGB 10.5* 03/19/2015   HCT 32.2* 03/19/2015   MCV 84.3 03/19/2015   PLT 235 03/19/2015    Assessment / Plan: 39.2 weeks.  Early labor.  Start low dose pitocin.  Labor: Early Preeclampsia:  n/a Fetal Wellbeing:  Category I Pain Control:  Labor support without medications I/D:  n/a Anticipated MOD:  NSVD  Vinny Taranto A 03/19/2015, 3:01 PM

## 2015-03-19 NOTE — Anesthesia Preprocedure Evaluation (Signed)
Anesthesia Evaluation  Patient identified by MRN, date of birth, ID band Patient awake    Reviewed: Allergy & Precautions, H&P , NPO status , Patient's Chart, lab work & pertinent test results  Airway Mallampati: II  TM Distance: >3 FB Neck ROM: full    Dental no notable dental hx.    Pulmonary former smoker,  breath sounds clear to auscultation  Pulmonary exam normal       Cardiovascular negative cardio ROS Normal cardiovascular examRhythm:regular Rate:Normal     Neuro/Psych Depression negative neurological ROS     GI/Hepatic negative GI ROS, Neg liver ROS,   Endo/Other  negative endocrine ROS  Renal/GU negative Renal ROS  negative genitourinary   Musculoskeletal   Abdominal   Peds  Hematology negative hematology ROS (+)   Anesthesia Other Findings Pregnancy - uncomplicated, 3rd child Platelets and allergies reviewed Denies active cardiac or pulmonary symptoms, METS > 4  Denies blood thinning medications, bleeding disorders, hypertension, asthma, supine hypotension syndrome, previous anesthesia difficulties    Reproductive/Obstetrics (+) Pregnancy                             Anesthesia Physical Anesthesia Plan  ASA: II  Anesthesia Plan: Epidural   Post-op Pain Management:    Induction:   Airway Management Planned:   Additional Equipment:   Intra-op Plan:   Post-operative Plan:   Informed Consent: I have reviewed the patients History and Physical, chart, labs and discussed the procedure including the risks, benefits and alternatives for the proposed anesthesia with the patient or authorized representative who has indicated his/her understanding and acceptance.     Plan Discussed with:   Anesthesia Plan Comments:         Anesthesia Quick Evaluation

## 2015-03-19 NOTE — H&P (Signed)
Sandra Anderson is a 24 y.o. female presenting for elective IOL. Maternal Medical History:  Fetal activity: Perceived fetal activity is normal.   Last perceived fetal movement was within the past hour.    Prenatal complications: no prenatal complications Prenatal Complications - Diabetes: none.    OB History    Gravida Para Term Preterm AB TAB SAB Ectopic Multiple Living   0      2     Past Medical History  Diagnosis Date  . No pertinent past medical history   . Chlamydia contact, treated   . UTI (urinary tract infection) during pregnancy   . 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 No past surgeries     Family History: family history includes Cirrhosis in her mother; Hypertension in her mother. Social History:  reports that she quit smoking about 2 months ago. Her smoking use included Cigarettes. She has a 1 pack-year smoking history. She has never used smokeless tobacco. She reports that she does not drink alcohol or use illicit drugs.   Prenatal Transfer Tool  Maternal Diabetes: No Genetic Screening: Abnormal Quad Screen Maternal Ultrasounds/Referrals: Normal Fetal Ultrasounds or other Referrals:  Referred to Materal Fetal Medicine  Maternal Substance Abuse:  No Significant Maternal Medications:  None Significant Maternal Lab Results:  None Other Comments:  None  Review of Systems  All other systems reviewed and are negative.   Dilation: Fingertip Effacement (%): Thick Exam by:: Susy Manor, RN  Height  (1.702 m), weight 189 lb (85.73 kg), last menstrual period 06/11/2014. Maternal Exam:  Abdomen: Patient reports no abdominal tenderness. Cervix: Cervix evaluated by digital exam.     Physical Exam  Nursing note and vitals reviewed. Constitutional: She is oriented to person, place, and time. She appears well-developed and well-nourished.  HENT:  Head: Normocephalic and atraumatic.   Eyes: Conjunctivae and EOM are normal. Pupils are equal, round, and reactive to light.  Neck: Normal range of motion. Neck supple.  Cardiovascular: Normal rate and regular rhythm.   Respiratory: Effort normal.  GI: Soft.  Genitourinary: Vagina normal and uterus normal.  Musculoskeletal: Normal range of motion.  Neurological: She is alert and oriented to person, place, and time.  Skin: Skin is warm and dry.  Psychiatric: She has a normal mood and affect. Her behavior is normal. Judgment and thought content normal.    Prenatal labs: ABO, Rh: O/POS/-- (02/02 1338) Antibody: NEG (02/02 1338) Rubella: 2.73 (02/02 1338) RPR: NON REAC (06/10 1012)  HBsAg: NEGATIVE (02/02 1338)  HIV: NONREACTIVE (06/10 1012)  GBS: NOT DETECTED (07/27 1446)   Assessment/Plan: 39 weeks.  Elective IOL.  Cytotec cervical ripening ordered.   HARPER,CHARLES A 03/19/2015, 1:48 AM

## 2015-03-20 LAB — CBC
HCT: 31.1 % — ABNORMAL LOW (ref 36.0–46.0)
Hemoglobin: 10.2 g/dL — ABNORMAL LOW (ref 12.0–15.0)
MCH: 27.6 pg (ref 26.0–34.0)
MCHC: 32.8 g/dL (ref 30.0–36.0)
MCV: 84.1 fL (ref 78.0–100.0)
Platelets: 225 10*3/uL (ref 150–400)
RBC: 3.7 MIL/uL — ABNORMAL LOW (ref 3.87–5.11)
RDW: 14.4 % (ref 11.5–15.5)
WBC: 17.1 10*3/uL — ABNORMAL HIGH (ref 4.0–10.5)

## 2015-03-20 NOTE — Anesthesia Postprocedure Evaluation (Signed)
Anesthesia Post Note  Patient: Sandra Anderson  Procedure(s) Performed: * No procedures listed *  Anesthesia type: Epidural  Patient location: Mother/Baby  Post pain: Pain level controlled  Post assessment: Post-op Vital signs reviewed  Last Vitals:  Filed Vitals:   03/20/15 0207  BP: 111/64  Pulse: 61  Temp: 36.8 C  Resp: 18    Post vital signs: Reviewed  Level of consciousness: awake  Complications: No apparent anesthesia complications

## 2015-03-20 NOTE — Progress Notes (Signed)
Post Partum Day 1 Subjective: no complaints  Objective: Blood pressure 111/64, pulse 61, temperature 98.3 F (36.8 C), temperature source Oral, resp. rate 18, height  (1.702 m), weight 189 lb (85.73 kg), last menstrual period 06/11/2014, SpO2 100 %, unknown if currently breastfeeding.  Physical Exam:  General: alert and no distress Lochia: appropriate Uterine Fundus: firm Incision: none DVT Evaluation: No evidence of DVT seen on physical exam.   Recent Labs  03/19/15 0115 03/20/15 0625  HGB 10.5* 10.2*  HCT 32.2* 31.1*    Assessment/Plan: Plan for discharge tomorrow   LOS: 1 day   Sandra Anderson A 03/20/2015, 7:11 AM

## 2015-03-20 NOTE — Progress Notes (Signed)
UR chart review completed.  

## 2015-03-21 MED ORDER — IBUPROFEN 600 MG PO TABS: 600.0000 mg | ORAL_TABLET | Freq: Four times a day (QID) | ORAL | Status: DC | PRN

## 2015-03-21 MED ORDER — OXYCODONE-ACETAMINOPHEN 5-325 MG PO TABS: 1.0000 | ORAL_TABLET | ORAL | Status: DC | PRN

## 2015-03-21 NOTE — Discharge Summary (Signed)
Obstetric Discharge Summary Reason for Admission: induction of labor Prenatal Procedures: ultrasound Intrapartum Procedures: spontaneous vaginal delivery Postpartum Procedures: none Complications-Operative and Postpartum: none HEMOGLOBIN  Date Value Ref Range Status  03/20/2015 10.2* 12.0 - 15.0 g/dL Final   HCT  Date Value Ref Range Status  03/20/2015 31.1* 36.0 - 46.0 % Final    Physical Exam:  General: alert and no distress Lochia: appropriate Uterine Fundus: firm Incision: none DVT Evaluation: No evidence of DVT seen on physical exam.  Discharge Diagnoses: Term Pregnancy-delivered  Discharge Information: Date: 03/21/2015 Activity: pelvic rest Diet: routine Medications: PNV, Ibuprofen, Colace and Percocet Condition: stable Instructions: refer to practice specific booklet Discharge to: home   Newborn Data: Live born female  Birth Weight: 7 lb 7.8 oz (3395 g) APGAR: 9, 9  Home with mother.  Sandra Anderson 03/21/2015, 7:40 AM
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
## 2015-03-21 NOTE — Progress Notes (Signed)
Post Partum Day 2 Subjective: no complaints  Objective: Blood pressure 121/81, pulse 58, temperature 97.8 F (36.6 C), temperature source Oral, resp. rate 16, height  (1.702 m), weight 189 lb (85.73 kg), last menstrual period 06/11/2014, SpO2 99 %, unknown if currently breastfeeding.  Physical Exam:  General: alert and no distress Lochia: appropriate Uterine Fundus: firm Incision: none DVT Evaluation: No evidence of DVT seen on physical exam.   Recent Labs  03/19/15 0115 03/20/15 0625  HGB 10.5* 10.2*  HCT 32.2* 31.1*    Assessment/Plan: Discharge home   LOS: 2 days   Kaylon Laroche A 03/21/2015, 7:36 AM

## 2015-04-02 ENCOUNTER — Encounter: Payer: Self-pay | Admitting: Obstetrics

## 2015-04-02 ENCOUNTER — Ambulatory Visit (INDEPENDENT_AMBULATORY_CARE_PROVIDER_SITE_OTHER): Payer: Medicaid Other | Admitting: Obstetrics

## 2015-04-02 DIAGNOSIS — F53 Postpartum depression: Secondary | ICD-10-CM

## 2015-04-02 DIAGNOSIS — F329 Major depressive disorder, single episode, unspecified: Secondary | ICD-10-CM

## 2015-04-02 DIAGNOSIS — O99345 Other mental disorders complicating the puerperium: Secondary | ICD-10-CM

## 2015-04-02 MED ORDER — CITALOPRAM HYDROBROMIDE 20 MG PO TABS: 20.0000 mg | ORAL_TABLET | Freq: Every day | ORAL | Status: DC

## 2015-04-02 NOTE — Progress Notes (Addendum)
Subjective:     Sandra Anderson is a 24 y.o. female who presents for a postpartum visit. She is 2 weeks postpartum following a spontaneous vaginal delivery. I have fully reviewed the prenatal and intrapartum course. The delivery was at 39 gestational weeks. Outcome: spontaneous vaginal delivery. Anesthesia: epidural. Postpartum course has been normal. Baby's course has been normal. Baby is feeding by bottle - Similac Advance. Bleeding thin lochia. Bowel function is normal. Bladder function is normal. Patient is not sexually active. Contraception method is abstinence. Postpartum depression screening: positive.  Tobacco, alcohol and substance abuse history reviewed.  Adult immunizations reviewed including TDAP, rubella and varicella.  The following portions of the patient's history were reviewed and updated as appropriate: allergies, current medications, past family history, past medical history, past social history, past surgical history and problem list.  Review of Systems A comprehensive review of systems was negative except for: Behavioral/Psych: positive for depression   Objective:    BP 136/90 mmHg  Pulse 52  Temp(Src) 98.2 F (36.8 C)  Ht  (1.702 m)  Wt 162 lb (73.483 kg)  BMI 25.37 kg/m2  LMP 06/11/2014  Breastfeeding? No   PE:  Deferred   100% of 10 min visit spent on counseling and coordination of care.   Assessment:    2 weeks postpartum.  Postpartum depression.  Wants medical therapy.  Offered counseling, but patient refused.  Plan:    1. Contraception: Options discussed 2. Celexa Rx 3. Follow up in: 4 weeks or as needed.   Healthy lifestyle practices reviewed

## 2015-04-30 ENCOUNTER — Ambulatory Visit (INDEPENDENT_AMBULATORY_CARE_PROVIDER_SITE_OTHER): Payer: Medicaid Other | Admitting: Obstetrics

## 2015-04-30 ENCOUNTER — Encounter: Payer: Self-pay | Admitting: *Deleted

## 2015-04-30 DIAGNOSIS — Z3009 Encounter for other general counseling and advice on contraception: Secondary | ICD-10-CM

## 2015-04-30 NOTE — Progress Notes (Signed)
Subjective:     Sandra Anderson is a 24 y.o. female who presents for a postpartum visit. She is 6 weeks postpartum following a spontaneous vaginal delivery. I have fully reviewed the prenatal and intrapartum course. The delivery was at 32 gestational weeks. Outcome: spontaneous vaginal delivery. Anesthesia: epidural. Postpartum course has been normal. Baby's course has been normal. Baby is feeding by bottle - Similac Advance. Bleeding thin lochia. Bowel function is normal. Bladder function is normal. Patient is not sexually active. Contraception method is none. Postpartum depression screening: negative.  Tobacco, alcohol and substance abuse history reviewed.  Adult immunizations reviewed including TDAP, rubella and varicella.  The following portions of the patient's history were reviewed and updated as appropriate: allergies, current medications, past family history, past medical history, past social history, past surgical history and problem list.  Review of Systems A comprehensive review of systems was negative.   Objective:    BP 126/91 mmHg  Pulse 69  Wt 163 lb (73.936 kg)  General:  alert and no distress   Breasts:  inspection negative, no nipple discharge or bleeding, no masses or nodularity palpable  Lungs: clear to auscultation bilaterally  Heart:  regular rate and rhythm, S1, S2 normal, no murmur, click, rub or gallop  Abdomen: normal findings: soft, non-tender   Vulva:  normal  Vagina: normal vagina  Cervix:  no cervical motion tenderness  Corpus: normal size, contour, position, consistency, mobility, non-tender  Adnexa:  no mass, fullness, tenderness  Rectal Exam: Not performed.          Assessment:     Normal postpartum exam. Pap smear not done at today's visit.     Contraceptive counseling and advice  Plan:    1. Contraception: options discussed 2. Continue PNV's 3. Follow up in: 6 weeks or as needed.  2hr GTT for h/o GDM/screening for DM q 3 yrs per ADA  recommendations Preconception counseling provided Healthy lifestyle practices reviewed

## 2015-05-04 ENCOUNTER — Encounter: Payer: Self-pay | Admitting: Obstetrics

## 2015-06-11 ENCOUNTER — Ambulatory Visit: Payer: Medicaid Other | Admitting: Obstetrics

## 2015-12-03 ENCOUNTER — Encounter (HOSPITAL_COMMUNITY): Payer: Self-pay | Admitting: *Deleted

## 2015-12-03 ENCOUNTER — Ambulatory Visit (HOSPITAL_COMMUNITY)
Admission: EM | Admit: 2015-12-03 | Discharge: 2015-12-03 | Disposition: A | Discharge status: Home / Self Care | Payer: Medicaid Other | Attending: Family Medicine | Admitting: Family Medicine

## 2015-12-03 DIAGNOSIS — B86 Scabies: Secondary | ICD-10-CM

## 2015-12-03 MED ORDER — PERMETHRIN 5 % EX CREA: TOPICAL_CREAM | CUTANEOUS | Status: DC

## 2015-12-03 NOTE — ED Provider Notes (Signed)
CSN: [id number]     Arrival date & time 12/03/15  1424 History [id number]  First MD Initiated Contact with Patient 12/03/15 1518     Chief Complaint  Patient presents with  . Rash   (Consider location/radiation/quality/duration/timing/severity/associated sxs/prior Treatment) Patient is a 25 y.o. female presenting with rash. The history is provided by the patient.  Rash Location:  Full body Quality: itchiness and redness   Quality: not scaling, not swelling and not weeping   Severity:  Mild Onset quality:  Gradual Duration:  3 weeks Progression:  Spreading Chronicity:  New Context: not sick contacts     Past Medical History  Diagnosis Date  . No pertinent past medical history   . Chlamydia contact, treated   . UTI (urinary tract infection) during pregnancy   . 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 No past surgeries     Family History  Problem Relation Age of Onset  . Hypertension Mother   . Cirrhosis Mother    Social History  Substance Use Topics  . Smoking status: Current Every Day Smoker -- 0.50 packs/day for 2 years    Types: Cigarettes    Last Attempt to Quit: 01/10/2015  . Smokeless tobacco: Never Used  . Alcohol Use: No   OB History    Gravida Para Term Preterm AB TAB SAB Ectopic Multiple Living   3 3 3  0     0 3     Review of Systems  Constitutional: Negative.   Musculoskeletal: Negative.   Skin: Positive for rash.  All other systems reviewed and are negative.   Allergies  Review of patient's allergies indicates no known allergies.  Home Medications   Prior to Admission medications   Medication Sig Start Date End Date Taking? Authorizing Provider  citalopram (CELEXA) 20 MG tablet Take 1 tablet (20 mg total) by mouth daily. Patient not taking: Reported on 04/30/2015 04/02/15   Brock Badharles A Harper, MD  ibuprofen (ADVIL,MOTRIN) 600 MG tablet Take 1 tablet (600 mg total) by mouth every 6 (six) hours  as needed for moderate pain. Patient not taking: Reported on 04/02/2015 03/21/15   Brock Badharles A Harper, MD  oxyCODONE-acetaminophen (PERCOCET/ROXICET) 5-325 MG per tablet Take 1-2 tablets by mouth every 4 (four) hours as needed (for pain scale greater than 7). Patient not taking: Reported on 04/02/2015 03/21/15   Brock Badharles A Harper, MD  permethrin (ELIMITE) 5 % cream Apply over body as directed tonight, wash off in am, repeat if needed in 1 week. 12/03/15   Linna HoffJames D Kindl, MD  Prenat-FeCbn-FeAspGl-FA-Omega (OB COMPLETE PETITE) 35-5-1-200 MG CAPS Take 1 capsule by mouth daily before breakfast. Patient not taking: Reported on 04/02/2015 12/03/14   Brock Badharles A Harper, MD   Meds Ordered and Administered this Visit  Medications - No data to display  BP 136/84 mmHg  Pulse 70  Temp(Src) 98.5 F (36.9 C) (Oral)  Resp 14  SpO2 100%  LMP 11/07/2015 No data found.   Physical Exam  Constitutional: She is oriented to person, place, and time. She appears well-developed and well-nourished. No distress.  Neck: Normal range of motion. Neck supple.  Lymphadenopathy:    She has no cervical adenopathy.  Neurological: She is alert and oriented to person, place, and time.  Skin: Skin is warm and dry. Rash noted. There is erythema.  Tiny erythematous crusting papular irreg scattered lesions on arms, chest, back.  Nursing note and vitals reviewed.  ED Course  Procedures (including critical care time)  Labs Review Labs Reviewed - No data to display  Imaging Review No results found.   Visual Acuity Review  Right Eye Distance:   Left Eye Distance:   Bilateral Distance:    Right Eye Near:   Left Eye Near:    Bilateral Near:         MDM   1. Scabies        Linna Hoff, MD 12/03/15 [phone]

## 2015-12-03 NOTE — ED Notes (Signed)
Pt  Reports  Symptoms  Of  A  Rash  On  Arms  Chest      And  Back    X  2  Weeks      -  That     2  Weeks     Ago      She    Stayed   With  Her  Friend     For  sev  Weeks    She     denys  Starting  Any  New  meds       She    Reports  The  Rash itches

## 2016-01-20 ENCOUNTER — Encounter (HOSPITAL_COMMUNITY): Payer: Self-pay

## 2016-01-20 ENCOUNTER — Ambulatory Visit (HOSPITAL_COMMUNITY)
Admission: EM | Admit: 2016-01-20 | Discharge: 2016-01-20 | Disposition: A | Discharge status: Home / Self Care | Payer: Medicaid Other | Attending: Family Medicine | Admitting: Family Medicine

## 2016-01-20 DIAGNOSIS — N390 Urinary tract infection, site not specified: Secondary | ICD-10-CM | POA: Diagnosis not present

## 2016-01-20 DIAGNOSIS — N760 Acute vaginitis: Secondary | ICD-10-CM | POA: Diagnosis not present

## 2016-01-20 LAB — POCT URINALYSIS DIP (DEVICE)
BILIRUBIN URINE: NEGATIVE
GLUCOSE, UA: NEGATIVE mg/dL
KETONES UR: NEGATIVE mg/dL
NITRITE: NEGATIVE
PH: 7 (ref 5.0–8.0)
Protein, ur: 100 mg/dL — AB
Specific Gravity, Urine: 1.02 (ref 1.005–1.030)
Urobilinogen, UA: 0.2 mg/dL (ref 0.0–1.0)

## 2016-01-20 LAB — POCT PREGNANCY, URINE: Preg Test, Ur: NEGATIVE

## 2016-01-20 MED ORDER — CEPHALEXIN 500 MG PO CAPS: 500.0000 mg | ORAL_CAPSULE | Freq: Four times a day (QID) | ORAL | Status: DC

## 2016-01-20 NOTE — ED Notes (Signed)
Patient presents with possible UTI she states she has pressure in lower abdomen after urination x1 week, no primary physician at this time  No acute distress

## 2016-01-20 NOTE — Discharge Instructions (Signed)
Take all of medicine as directed, drink lots of fluids, see your doctor if further problems. °

## 2016-01-20 NOTE — ED Provider Notes (Signed)
CSN: [id number]     Arrival date & time 01/20/16  1408 History   First MD Initiated Contact with Patient 01/20/16 1419     Chief Complaint  Patient presents with  . Urinary Tract Infection   (Consider location/radiation/quality/duration/timing/severity/associated sxs/prior Treatment) Patient is a 25 y.o. female presenting with urinary tract infection. The history is provided by the patient.  Urinary Tract Infection Pain quality:  Burning Pain severity:  Moderate Onset quality:  Gradual Duration:  10 days Chronicity:  New Recent urinary tract infections: no   Relieved by:  None tried Worsened by:  Nothing tried Ineffective treatments:  None tried Urinary symptoms: discolored urine, foul-smelling urine and frequent urination   Associated symptoms: no abdominal pain, no fever, no flank pain, no nausea and no vomiting     Past Medical History  Diagnosis Date  . No pertinent past medical history   . Chlamydia contact, treated   . UTI (urinary tract infection) during pregnancy   . 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 No past surgeries     Family History  Problem Relation Age of Onset  . Hypertension Mother   . Cirrhosis Mother    Social History  Substance Use Topics  . Smoking status: Current Every Day Smoker -- 0.50 packs/day for 2 years    Types: Cigarettes    Last Attempt to Quit: 01/10/2015  . Smokeless tobacco: Never Used  . Alcohol Use: No   OB History    Gravida Para Term Preterm AB TAB SAB Ectopic Multiple Living   3 3 3  0     0 3     Review of Systems  Constitutional: Negative.  Negative for fever.  Gastrointestinal: Negative.  Negative for nausea, vomiting and abdominal pain.  Genitourinary: Positive for dysuria, urgency and frequency. Negative for flank pain.  All other systems reviewed and are negative.   Allergies  Review of patient's allergies indicates no known allergies.  Home  Medications   Prior to Admission medications   Medication Sig Start Date End Date Taking? Authorizing Provider  cephALEXin (KEFLEX) 500 MG capsule Take 1 capsule (500 mg total) by mouth 4 (four) times daily. Take all of medicine and drink lots of fluids 01/20/16   Linna HoffJames D Jesse Nosbisch, MD  citalopram (CELEXA) 20 MG tablet Take 1 tablet (20 mg total) by mouth daily. Patient not taking: Reported on 04/30/2015 04/02/15   Brock Badharles A Harper, MD  ibuprofen (ADVIL,MOTRIN) 600 MG tablet Take 1 tablet (600 mg total) by mouth every 6 (six) hours as needed for moderate pain. Patient not taking: Reported on 04/02/2015 03/21/15   Brock Badharles A Harper, MD  oxyCODONE-acetaminophen (PERCOCET/ROXICET) 5-325 MG per tablet Take 1-2 tablets by mouth every 4 (four) hours as needed (for pain scale greater than 7). Patient not taking: Reported on 04/02/2015 03/21/15   Brock Badharles A Harper, MD  permethrin (ELIMITE) 5 % cream Apply over body as directed tonight, wash off in am, repeat if needed in 1 week. 12/03/15   Linna HoffJames D Ife Vitelli, MD  Prenat-FeCbn-FeAspGl-FA-Omega (OB COMPLETE PETITE) 35-5-1-200 MG CAPS Take 1 capsule by mouth daily before breakfast. Patient not taking: Reported on 04/02/2015 12/03/14   Brock Badharles A Harper, MD   Meds Ordered and Administered this Visit  Medications - No data to display  BP 132/92 mmHg  Pulse 60  Temp(Src) 98 F (36.7 C) (Oral)  Resp 15  SpO2 97%  LMP 01/07/2016 (Approximate)  No data found.   Physical Exam  Constitutional: She is oriented to person, place, and time. She appears well-developed and well-nourished. No distress.  Abdominal: Soft. Normal appearance and bowel sounds are normal. She exhibits no distension and no mass. There is tenderness in the suprapubic area. There is no rigidity, no rebound, no guarding and no CVA tenderness.  Neurological: She is alert and oriented to person, place, and time.  Skin: Skin is warm and dry.  Nursing note and vitals reviewed.   ED Course  Procedures (including  critical care time)  Labs Review Labs Reviewed  POCT URINALYSIS DIP (DEVICE) - Abnormal; Notable for the following:    Hgb urine dipstick LARGE (*)    Protein, ur 100 (*)    Leukocytes, UA LARGE (*)    All other components within normal limits  POCT PREGNANCY, URINE    Imaging Review No results found.   Visual Acuity Review  Right Eye Distance:   Left Eye Distance:   Bilateral Distance:    Right Eye Near:   Left Eye Near:    Bilateral Near:         MDM   1. UTI (lower urinary tract infection)       Linna HoffJames D Kinnick Maus, MD 01/20/16 2046

## 2016-01-29 ENCOUNTER — Ambulatory Visit (HOSPITAL_COMMUNITY)
Admission: EM | Admit: 2016-01-29 | Discharge: 2016-01-29 | Disposition: A | Discharge status: Home / Self Care | Payer: Medicaid Other | Attending: Family Medicine | Admitting: Family Medicine

## 2016-01-29 ENCOUNTER — Encounter (HOSPITAL_COMMUNITY): Payer: Self-pay | Admitting: Emergency Medicine

## 2016-01-29 DIAGNOSIS — N760 Acute vaginitis: Secondary | ICD-10-CM

## 2016-01-29 DIAGNOSIS — F17210 Nicotine dependence, cigarettes, uncomplicated: Secondary | ICD-10-CM | POA: Insufficient documentation

## 2016-01-29 DIAGNOSIS — F329 Major depressive disorder, single episode, unspecified: Secondary | ICD-10-CM | POA: Diagnosis not present

## 2016-01-29 DIAGNOSIS — Z79899 Other long term (current) drug therapy: Secondary | ICD-10-CM | POA: Insufficient documentation

## 2016-01-29 LAB — POCT URINALYSIS DIP (DEVICE)
BILIRUBIN URINE: NEGATIVE
Glucose, UA: NEGATIVE mg/dL
HGB URINE DIPSTICK: NEGATIVE
KETONES UR: NEGATIVE mg/dL
Leukocytes, UA: NEGATIVE
Nitrite: NEGATIVE
PH: 6 (ref 5.0–8.0)
Protein, ur: NEGATIVE mg/dL
SPECIFIC GRAVITY, URINE: 1.025 (ref 1.005–1.030)
Urobilinogen, UA: 0.2 mg/dL (ref 0.0–1.0)

## 2016-01-29 LAB — POCT PREGNANCY, URINE: Preg Test, Ur: NEGATIVE

## 2016-01-29 MED ORDER — FLUCONAZOLE 150 MG PO TABS: 150.0000 mg | ORAL_TABLET | Freq: Every day | ORAL | Status: DC

## 2016-01-29 MED ORDER — METRONIDAZOLE 500 MG PO TABS: 500.0000 mg | ORAL_TABLET | Freq: Two times a day (BID) | ORAL | Status: DC

## 2016-01-29 NOTE — ED Notes (Signed)
Here for vaginal irritation onset x1 week Denies vag d/c, fevers, chills, urinary sx A&O x4... NAD

## 2016-01-29 NOTE — ED Notes (Signed)
Call back number verified and updated in EPIC... Adv pt to not have SI until lab results comeback neg.... Also adv pt lab results will be on MyChart; instructions given .... Pt verb understanding.   

## 2016-01-29 NOTE — ED Provider Notes (Signed)
CSN: [id number]     Arrival date & time 01/29/16  1016 History   First MD Initiated Contact with Patient 01/29/16 1158     Chief Complaint  Patient presents with  . Vaginal Itching   (Consider location/radiation/quality/duration/timing/severity/associated sxs/prior Treatment) HPI  Vaginal irritation. Started 1 week ago. Denies any frank discharge. Sexually active without condoms. Last night. On 01/07/2016. Denies any new sexual partners. Was put on antibiotics on 01/20/2016 or urinary tract infection. This is resolved. Denies any fevers, lower abdominal pain, rash, adenopathy. Patient has a history of BV and yeast infections.   Past Medical History  Diagnosis Date  . No pertinent past medical history   . Chlamydia contact, treated   . UTI (urinary tract infection) during pregnancy   . 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 No past surgeries     Family History  Problem Relation Age of Onset  . Hypertension Mother   . Cirrhosis Mother    Social History  Substance Use Topics  . Smoking status: Current Every Day Smoker -- 0.50 packs/day for 2 years    Types: Cigarettes    Last Attempt to Quit: 01/10/2015  . Smokeless tobacco: Never Used  . Alcohol Use: No   OB History    Gravida Para Term Preterm AB TAB SAB Ectopic Multiple Living   3 3 3  0     0 3     Review of Systems Per HPI with all other pertinent systems negative.    Allergies  Review of patient's allergies indicates no known allergies.  Home Medications   Prior to Admission medications   Medication Sig Start Date End Date Taking? Authorizing Provider  cephALEXin (KEFLEX) 500 MG capsule Take 1 capsule (500 mg total) by mouth 4 (four) times daily. Take all of medicine and drink lots of fluids 01/20/16   Linna HoffJames D Kindl, MD  citalopram (CELEXA) 20 MG tablet Take 1 tablet (20 mg total) by mouth daily. Patient not taking: Reported on 04/30/2015 04/02/15    Brock Badharles A Harper, MD  fluconazole (DIFLUCAN) 150 MG tablet Take 1 tablet (150 mg total) by mouth daily. Repeat dose in 3 days 01/29/16   Ozella Rocksavid J Renly Roots, MD  ibuprofen (ADVIL,MOTRIN) 600 MG tablet Take 1 tablet (600 mg total) by mouth every 6 (six) hours as needed for moderate pain. Patient not taking: Reported on 04/02/2015 03/21/15   Brock Badharles A Harper, MD  metroNIDAZOLE (FLAGYL) 500 MG tablet Take 1 tablet (500 mg total) by mouth 2 (two) times daily. 01/29/16   Ozella Rocksavid J Cassanda Walmer, MD  oxyCODONE-acetaminophen (PERCOCET/ROXICET) 5-325 MG per tablet Take 1-2 tablets by mouth every 4 (four) hours as needed (for pain scale greater than 7). Patient not taking: Reported on 04/02/2015 03/21/15   Brock Badharles A Harper, MD  permethrin (ELIMITE) 5 % cream Apply over body as directed tonight, wash off in am, repeat if needed in 1 week. 12/03/15   Linna HoffJames D Kindl, MD  Prenat-FeCbn-FeAspGl-FA-Omega (OB COMPLETE PETITE) 35-5-1-200 MG CAPS Take 1 capsule by mouth daily before breakfast. Patient not taking: Reported on 04/02/2015 12/03/14   Brock Badharles A Harper, MD   Meds Ordered and Administered this Visit  Medications - No data to display  BP 131/77 mmHg  Pulse 65  Temp(Src) 98.3 F (36.8 C) (Oral)  Resp 16  SpO2 100%  LMP 01/07/2016 (Approximate)  Breastfeeding? No No data found.   Physical Exam Physical Exam  Constitutional:  oriented to person, place, and time. appears well-developed and well-nourished. No distress.  HENT:  Head: Normocephalic and atraumatic.  Eyes: EOMI. PERRL.  Neck: Normal range of motion.  Cardiovascular: RRR, no m/r/g, 2+ distal pulses,  Pulmonary/Chest: Effort normal and breath sounds normal. No respiratory distress.  Abdominal: Soft. Bowel sounds are normal. NonTTP, no distension.  Musculoskeletal: Normal range of motion. Non ttp, no effusion.  Neurological: alert and oriented to person, place, and time.  Skin: Skin is warm. No rash noted. non diaphoretic.  Psychiatric: normal mood and affect.  behavior is normal. Judgment and thought content normal.   ED Course  Procedures (including critical care time)  Labs Review Labs Reviewed  POCT URINALYSIS DIP (DEVICE)  POCT PREGNANCY, URINE  CERVICOVAGINAL ANCILLARY ONLY    Imaging Review No results found.   Visual Acuity Review  Right Eye Distance:   Left Eye Distance:   Bilateral Distance:    Right Eye Near:   Left Eye Near:    Bilateral Near:         MDM   1. Vaginitis    Wet prep sent. History and symptoms concerning for BV and/or yeast infection. Will start on metronidazole and Diflucan. Patient deferring full vaginal exam at this time so wet prep was done with by RN as blind swab     Ozella Rocksavid J Alley Neils, MD 01/29/16 1226

## 2016-01-29 NOTE — Discharge Instructions (Signed)
Your symptoms are likely from either BV or a yeast infection THese are common after having antibiotics Please take these medications as prescribed

## 2016-02-01 LAB — CERVICOVAGINAL ANCILLARY ONLY
CHLAMYDIA, DNA PROBE: NEGATIVE
NEISSERIA GONORRHEA: NEGATIVE

## 2016-02-02 LAB — CERVICOVAGINAL ANCILLARY ONLY: WET PREP (BD AFFIRM): POSITIVE — AB

## 2016-05-19 ENCOUNTER — Ambulatory Visit (INDEPENDENT_AMBULATORY_CARE_PROVIDER_SITE_OTHER): Payer: Medicaid Other | Admitting: *Deleted

## 2016-05-19 ENCOUNTER — Encounter: Payer: Self-pay | Admitting: Family Medicine

## 2016-05-19 DIAGNOSIS — Z3201 Encounter for pregnancy test, result positive: Secondary | ICD-10-CM

## 2016-05-19 LAB — POCT PREGNANCY, URINE: PREG TEST UR: POSITIVE — AB

## 2016-05-19 NOTE — Progress Notes (Signed)
Pt in for pregnancy test, has estimated delivery date of 01/14/17.

## 2016-05-24 ENCOUNTER — Inpatient Hospital Stay (HOSPITAL_COMMUNITY): Payer: Medicaid Other

## 2016-05-24 ENCOUNTER — Encounter: Payer: Self-pay | Admitting: Student

## 2016-05-24 ENCOUNTER — Inpatient Hospital Stay (HOSPITAL_COMMUNITY)
Admission: AD | Admit: 2016-05-24 | Discharge: 2016-05-24 | Disposition: A | Discharge status: Home / Self Care | Payer: Medicaid Other | Source: Ambulatory Visit | Attending: Family Medicine | Admitting: Family Medicine

## 2016-05-24 DIAGNOSIS — Z87891 Personal history of nicotine dependence: Secondary | ICD-10-CM | POA: Diagnosis not present

## 2016-05-24 DIAGNOSIS — Z3A01 Less than 8 weeks gestation of pregnancy: Secondary | ICD-10-CM | POA: Insufficient documentation

## 2016-05-24 DIAGNOSIS — O209 Hemorrhage in early pregnancy, unspecified: Secondary | ICD-10-CM | POA: Diagnosis not present

## 2016-05-24 DIAGNOSIS — R1030 Lower abdominal pain, unspecified: Secondary | ICD-10-CM | POA: Diagnosis present

## 2016-05-24 DIAGNOSIS — N9489 Other specified conditions associated with female genital organs and menstrual cycle: Secondary | ICD-10-CM

## 2016-05-24 DIAGNOSIS — O3680X0 Pregnancy with inconclusive fetal viability, not applicable or unspecified: Secondary | ICD-10-CM

## 2016-05-24 LAB — URINALYSIS, ROUTINE W REFLEX MICROSCOPIC
BILIRUBIN URINE: NEGATIVE
GLUCOSE, UA: NEGATIVE mg/dL
Ketones, ur: NEGATIVE mg/dL
Leukocytes, UA: NEGATIVE
Nitrite: NEGATIVE
PH: 6 (ref 5.0–8.0)
Protein, ur: NEGATIVE mg/dL
SPECIFIC GRAVITY, URINE: 1.025 (ref 1.005–1.030)

## 2016-05-24 LAB — CBC
HCT: 38.2 % (ref 36.0–46.0)
Hemoglobin: 12.5 g/dL (ref 12.0–15.0)
MCH: 26.5 pg (ref 26.0–34.0)
MCHC: 32.7 g/dL (ref 30.0–36.0)
MCV: 81.1 fL (ref 78.0–100.0)
PLATELETS: 311 10*3/uL (ref 150–400)
RBC: 4.71 MIL/uL (ref 3.87–5.11)
RDW: 14.4 % (ref 11.5–15.5)
WBC: 5 10*3/uL (ref 4.0–10.5)

## 2016-05-24 LAB — POCT PREGNANCY, URINE: PREG TEST UR: POSITIVE — AB

## 2016-05-24 LAB — WET PREP, GENITAL
Clue Cells Wet Prep HPF POC: NONE SEEN
Sperm: NONE SEEN
Trich, Wet Prep: NONE SEEN
Yeast Wet Prep HPF POC: NONE SEEN

## 2016-05-24 LAB — URINE MICROSCOPIC-ADD ON

## 2016-05-24 LAB — HCG, QUANTITATIVE, PREGNANCY: hCG, Beta Chain, Quant, S: 467 m[IU]/mL — ABNORMAL HIGH (ref <5)

## 2016-05-24 NOTE — Discharge Instructions (Signed)
Return to care   If you have heavier bleeding that soaks through more that 2 pads per hour for an hour or more  If you bleed so much that you feel like you might pass out or you do pass out  If you have significant abdominal pain that is not improved with Tylenol   If you develop a fever > 100.5    Ectopic Pregnancy An ectopic pregnancy is when the fertilized egg attaches (implants) outside the uterus. Most ectopic pregnancies occur in the fallopian tube. Rarely do ectopic pregnancies occur on the ovary, intestine, pelvis, or cervix. In an ectopic pregnancy, the fertilized egg does not have the ability to develop into a normal, healthy baby.  A ruptured ectopic pregnancy is one in which the fallopian tube gets torn or bursts and results in internal bleeding. Often there is intense abdominal pain, and sometimes, vaginal bleeding. Having an ectopic pregnancy can be life threatening. If left untreated, this dangerous condition can lead to a blood transfusion, abdominal surgery, or even death. CAUSES  Damage to the fallopian tubes is the suspected cause in most ectopic pregnancies.  RISK FACTORS Depending on your circumstances, the risk of having an ectopic pregnancy will vary. The level of risk can be divided into three categories. High Risk  You have gone through infertility treatment.  You have had a previous ectopic pregnancy.  You have had previous tubal surgery.  You have had previous surgery to have the fallopian tubes tied (tubal ligation).  You have tubal problems or diseases.  You have been exposed to DES. DES is a medicine that was used until 1971 and had effects on babies whose mothers took the medicine.  You become pregnant while using an intrauterine device (IUD) for birth control. Moderate Risk  You have a history of infertility.  You have a history of a sexually transmitted infection (STI).  You have a history of pelvic inflammatory disease (PID).  You have  scarring from endometriosis.  You have multiple sexual partners.  You smoke. Low Risk  You have had previous pelvic surgery.  You use vaginal douching.  You became sexually active before 26 years of age. SIGNS AND SYMPTOMS  An ectopic pregnancy should be suspected in anyone who has missed a period and has abdominal pain or bleeding.  You may experience normal pregnancy symptoms, such as:  Nausea.  Tiredness.  Breast tenderness.  Other symptoms may include:  Pain with intercourse.  Irregular vaginal bleeding or spotting.  Cramping or pain on one side or in the lower abdomen.  Fast heartbeat.  Passing out while having a bowel movement.  Symptoms of a ruptured ectopic pregnancy and internal bleeding may include:  Sudden, severe pain in the abdomen and pelvis.  Dizziness or fainting.  Pain in the shoulder area. DIAGNOSIS  Tests that may be performed include:  A pregnancy test.  An ultrasound test.  Testing the specific level of pregnancy hormone in the bloodstream.  Taking a sample of uterus tissue (dilation and curettage, D&C).  Surgery to perform a visual exam of the inside of the abdomen using a thin, lighted tube with a tiny camera on the end (laparoscope). TREATMENT  An injection of a medicine called methotrexate may be given. This medicine causes the pregnancy tissue to be absorbed. It is given if:  The diagnosis is made early.  The fallopian tube has not ruptured.  You are considered to be a good candidate for the medicine. Usually, pregnancy hormone blood levels  are checked after methotrexate treatment. This is to be sure the medicine is effective. It may take 4-6 weeks for the pregnancy to be absorbed (though most pregnancies will be absorbed by 3 weeks). Surgical treatment may be needed. A laparoscope may be used to remove the pregnancy tissue. If severe internal bleeding occurs, a cut (incision) may be made in the lower abdomen (laparotomy), and  the ectopic pregnancy is removed. This stops the bleeding. Part of the fallopian tube, or the whole tube, may be removed as well (salpingectomy). After surgery, pregnancy hormone tests may be done to be sure there is no pregnancy tissue left. You may receive a Rho (D) immune globulin shot if you are Rh negative and the father is Rh positive, or if you do not know the Rh type of the father. This is to prevent problems with any future pregnancy. SEEK IMMEDIATE MEDICAL CARE IF:  You have any symptoms of an ectopic pregnancy. This is a medical emergency. MAKE SURE YOU:  Understand these instructions.  Will watch your condition.  Will get help right away if you are not doing well or get worse.   This information is not intended to replace advice given to you by your health care provider. Make sure you discuss any questions you have with your health care provider.   Document Released: 08/18/2004 Document Revised: 08/01/2014 Document Reviewed: 02/07/2013 Elsevier Interactive Patient Education [employer]2016 [employer]vier Inc.

## 2016-05-24 NOTE — MAU Provider Note (Signed)
History     CSN: [id number]  Arrival date and time: 05/24/16 0830   First Provider Initiated Contact with Patient 05/24/16 [id number]      Chief Complaint  Patient presents with  . Abdominal Pain  . Vaginal Bleeding   HPI Sandra Anderson is a 25 y.o. G4P3003 at 1166w3d by LMP who presents with vaginal bleeding & abdominal cramping. Initially noticed spotting since last week that increased to bleeding lighter than menses 3 days ago. Blood was initially brown in color but she now describes it as pink/red. Lower abdominal cramping x 3 days. Pain comes & goes. Rates pain 6/10. Has not treated pain. Denies n/v/d, constipation, dysuria, fever, or vaginal discharge.   OB History    Gravida Para Term Preterm AB Living   4 3 3  0   3   SAB TAB Ectopic Multiple Live Births         0 3      Past Medical History:  Diagnosis Date  . Chlamydia contact, treated   . 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 NO PAST SURGERIES      Family History  Problem Relation Age of Onset  . Hypertension Mother   . Cirrhosis Mother     Social History  Substance Use Topics  . Smoking status: Former Smoker    Packs/day: 0.50    Years: 2.00    Types: Cigarettes    Quit date: 01/10/2015  . Smokeless tobacco: Never Used  . Alcohol use No    Allergies: No Known Allergies  No prescriptions prior to admission.    Review of Systems  Constitutional: Negative.   Gastrointestinal: Positive for abdominal pain. Negative for constipation, diarrhea, nausea and vomiting.  Genitourinary: Negative for dysuria.       + vaginal bleeding   Physical Exam   Blood pressure 123/74, pulse 66, temperature 97.8 F (36.6 C), temperature source Oral, resp. rate 18, height 5\' 7"  (1.702 m), weight 162 lb 3.2 oz (73.6 kg), last menstrual period 04/09/2016, not currently breastfeeding.  Physical Exam  Nursing note and vitals reviewed. Constitutional: She is  oriented to person, place, and time. She appears well-developed and well-nourished. No distress.  HENT:  Head: Normocephalic and atraumatic.  Eyes: Conjunctivae are normal. Right eye exhibits no discharge. Left eye exhibits no discharge. No scleral icterus.  Neck: Normal range of motion.  Cardiovascular: Normal rate.   Respiratory: Effort normal. No respiratory distress.  GI: Soft. She exhibits no distension. There is no tenderness. There is no rebound and no guarding.  Genitourinary: Uterus normal. Cervix exhibits no motion tenderness and no friability. Right adnexum displays no mass and no tenderness. Left adnexum displays no mass and no tenderness. There is bleeding (small amount of dark red blood cleared out with 1 fox swab) in the vagina.  Genitourinary Comments: Cervix dilated 0.5 cm  Neurological: She is alert and oriented to person, place, and time.  Skin: Skin is warm and dry. She is not diaphoretic.  Psychiatric: She has a normal mood and affect. Her behavior is normal. Judgment and thought content normal.    MAU Course  Procedures Results for orders placed or performed during the hospital encounter of 05/24/16 (from the past 24 hour(s))  Urinalysis, Routine w reflex microscopic (not at [facility])     Status: Abnormal   Collection Time: 05/24/16  8:34 AM  Result Value Ref Range   Color, Urine  YELLOW YELLOW   APPearance HAZY (A) CLEAR   Specific Gravity, Urine 1.025 1.005 - 1.030   pH 6.0 5.0 - 8.0   Glucose, UA NEGATIVE NEGATIVE mg/dL   Hgb urine dipstick LARGE (A) NEGATIVE   Bilirubin Urine NEGATIVE NEGATIVE   Ketones, ur NEGATIVE NEGATIVE mg/dL   Protein, ur NEGATIVE NEGATIVE mg/dL   Nitrite NEGATIVE NEGATIVE   Leukocytes, UA NEGATIVE NEGATIVE  Urine microscopic-add on     Status: Abnormal   Collection Time: 05/24/16  8:34 AM  Result Value Ref Range   Squamous Epithelial / LPF 0-5 (A) NONE SEEN   WBC, UA 0-5 0 - 5 WBC/hpf   RBC / HPF 6-30 0 - 5 RBC/hpf   Bacteria, UA FEW  (A) NONE SEEN   Urine-Other MUCOUS PRESENT   Pregnancy, urine POC     Status: Abnormal   Collection Time: 05/24/16  8:40 AM  Result Value Ref Range   Preg Test, Ur POSITIVE (A) NEGATIVE  CBC     Status: None   Collection Time: 05/24/16  8:59 AM  Result Value Ref Range   WBC 5.0 4.0 - 10.5 K/uL   RBC 4.71 3.87 - 5.11 MIL/uL   Hemoglobin 12.5 12.0 - 15.0 g/dL   HCT 16.1 09.6 - 04.5 %   MCV 81.1 78.0 - 100.0 fL   MCH 26.5 26.0 - 34.0 pg   MCHC 32.7 30.0 - 36.0 g/dL   RDW 40.9 81.1 - 91.4 %   Platelets 311 150 - 400 K/uL  hCG, quantitative, pregnancy     Status: Abnormal   Collection Time: 05/24/16  8:59 AM  Result Value Ref Range   hCG, Beta Chain, Quant, S 467 (H) <5 mIU/mL  Wet prep, genital     Status: Abnormal   Collection Time: 05/24/16  9:20 AM  Result Value Ref Range   Yeast Wet Prep HPF POC NONE SEEN NONE SEEN   Trich, Wet Prep NONE SEEN NONE SEEN   Clue Cells Wet Prep HPF POC NONE SEEN NONE SEEN   WBC, Wet Prep HPF POC FEW (A) NONE SEEN   Sperm NONE SEEN    US Ob Comp Less 14 Wks  Result Date: 05/24/2016 CLINICAL DATA:  First trimester vaginal bleeding. EXAM: OBSTETRIC <14 WK [place] AND TRANSVAGINAL OB US TECHNIQUE: Both transabdominal and transvaginal ultrasound examinations were performed for complete evaluation of the gestation as well as the maternal uterus, adnexal regions, and pelvic cul-de-sac. Transvaginal technique was performed to assess early pregnancy. COMPARISON:  None FINDINGS: Intrauterine gestational sac: None Yolk sac:  None Embryo:  None Subchorionic hemorrhage:  None visualized. Maternal uterus/adnexae: Right ovary: Normal Left ovary: Contains a corpus luteum. Other :Within the right adnexa there is a solid-appearing rounded structure measuring 1.2 x 0.7 x 1.0 cm with associated increased blood flow. Free fluid:  Trace free fluid noted within the pelvis. IMPRESSION: 1. No intrauterine pregnancy. 2. Solid-appearing structure within the right adnexa adjacent to  the right ovary measuring 1.2 cm with increased blood flow. Cannot rule out ectopic pregnancy. 3. Critical Value/emergent results were called by telephone at the time of interpretation on 05/24/2016 at 10:36 am to Dr. Judeth Horn , who verbally acknowledged these results. Electronically Signed   By: Signa Kell M.D.   On: 05/24/2016 10:37   US Ob Transvaginal  Result Date: 05/24/2016 CLINICAL DATA:  First trimester vaginal bleeding. EXAM: OBSTETRIC <14 WK [place] AND TRANSVAGINAL OB US TECHNIQUE: Both transabdominal and transvaginal ultrasound examinations were performed for  complete evaluation of the gestation as well as the maternal uterus, adnexal regions, and pelvic cul-de-sac. Transvaginal technique was performed to assess early pregnancy. COMPARISON:  None FINDINGS: Intrauterine gestational sac: None Yolk sac:  None Embryo:  None Subchorionic hemorrhage:  None visualized. Maternal uterus/adnexae: Right ovary: Normal Left ovary: Contains a corpus luteum. Other :Within the right adnexa there is a solid-appearing rounded structure measuring 1.2 x 0.7 x 1.0 cm with associated increased blood flow. Free fluid:  Trace free fluid noted within the pelvis. IMPRESSION: 1. No intrauterine pregnancy. 2. Solid-appearing structure within the right adnexa adjacent to the right ovary measuring 1.2 cm with increased blood flow. Cannot rule out ectopic pregnancy. 3. Critical Value/emergent results were called by telephone at the time of interpretation on 05/24/2016 at 10:36 am to Dr. Judeth HornERIN Alyaan Budzynski , who verbally acknowledged these results. Electronically Signed   By: Signa Kellaylor  Stroud M.D.   On: 05/24/2016 10:37     MDM +UPT UA, wet prep, GC/chlamydia, CBC, quant hCG, HIV, and US today to rule out ectopic pregnancy O positive BHCG 467 Radiologist called regarding ultrasound-- no iup; solid structure in right adnexa concerning for ectopic measuring 1.2 cm; trace free fluid S/w Dr. Shawnie PonsPratt regarding labs, ultrasound &  pt presentation. Offer mtx vs f/u BHCG based on discussion with patient Had lengthy discussion with patient regarding possibility of ectopic pregnancy & risks associated with it especially if left untreated (including emergent surgery or death). Patient opts for f/u BHCG. Pt states she has reliable transportation to easily return to [facility] if needed Offered spiritual care services; pt declines at this time Assessment and Plan  A: 1. Pregnancy of unknown anatomic location   2. Vaginal bleeding in pregnancy, first trimester   3. Adnexal mass    P: Discharge home Strict return precautions r/t s/s of ruptured ectopic pregnancy Return to [facility]U Thursday for BHCG (pt can't get to clinic during office hours)   Judeth HornErin Tyshauna Finkbiner 05/24/2016, 9:05 AM

## 2016-05-24 NOTE — MAU Note (Signed)
Pt started spotting last week, has become heavier.  Also having mild cramping which started yesterday.

## 2016-05-25 LAB — GC/CHLAMYDIA PROBE AMP (~~LOC~~) NOT AT ARMC
CHLAMYDIA, DNA PROBE: NEGATIVE
NEISSERIA GONORRHEA: NEGATIVE

## 2016-05-25 LAB — HIV ANTIBODY (ROUTINE TESTING W REFLEX): HIV Screen 4th Generation wRfx: NONREACTIVE

## 2016-05-26 ENCOUNTER — Inpatient Hospital Stay (HOSPITAL_COMMUNITY)
Admission: AD | Admit: 2016-05-26 | Discharge: 2016-05-26 | Disposition: A | Discharge status: Home / Self Care | Payer: Medicaid Other | Source: Ambulatory Visit | Attending: Obstetrics & Gynecology | Admitting: Obstetrics & Gynecology

## 2016-05-26 ENCOUNTER — Other Ambulatory Visit: Payer: Medicaid Other

## 2016-05-26 DIAGNOSIS — Z87891 Personal history of nicotine dependence: Secondary | ICD-10-CM | POA: Diagnosis not present

## 2016-05-26 DIAGNOSIS — O3680X0 Pregnancy with inconclusive fetal viability, not applicable or unspecified: Secondary | ICD-10-CM

## 2016-05-26 DIAGNOSIS — O00101 Right tubal pregnancy without intrauterine pregnancy: Secondary | ICD-10-CM | POA: Insufficient documentation

## 2016-05-26 DIAGNOSIS — Z3A01 Less than 8 weeks gestation of pregnancy: Secondary | ICD-10-CM | POA: Insufficient documentation

## 2016-05-26 DIAGNOSIS — O0281 Inappropriate change in quantitative human chorionic gonadotropin (hCG) in early pregnancy: Secondary | ICD-10-CM | POA: Diagnosis not present

## 2016-05-26 DIAGNOSIS — O209 Hemorrhage in early pregnancy, unspecified: Secondary | ICD-10-CM | POA: Diagnosis present

## 2016-05-26 LAB — CBC WITH DIFFERENTIAL/PLATELET
BASOS PCT: 1 %
Basophils Absolute: 0 10*3/uL (ref 0.0–0.1)
EOS ABS: 0.1 10*3/uL (ref 0.0–0.7)
EOS PCT: 1 %
HCT: 38.2 % (ref 36.0–46.0)
HEMOGLOBIN: 12.9 g/dL (ref 12.0–15.0)
LYMPHS ABS: 2.6 10*3/uL (ref 0.7–4.0)
Lymphocytes Relative: 46 %
MCH: 27.2 pg (ref 26.0–34.0)
MCHC: 33.8 g/dL (ref 30.0–36.0)
MCV: 80.6 fL (ref 78.0–100.0)
Monocytes Absolute: 0.2 10*3/uL (ref 0.1–1.0)
Monocytes Relative: 4 %
NEUTROS PCT: 48 %
Neutro Abs: 2.7 10*3/uL (ref 1.7–7.7)
PLATELETS: 327 10*3/uL (ref 150–400)
RBC: 4.74 MIL/uL (ref 3.87–5.11)
RDW: 14.3 % (ref 11.5–15.5)
WBC: 5.7 10*3/uL (ref 4.0–10.5)

## 2016-05-26 LAB — CREATININE, SERUM: Creatinine, Ser: 0.64 mg/dL (ref 0.44–1.00)

## 2016-05-26 LAB — BUN: BUN: 11 mg/dL (ref 6–20)

## 2016-05-26 LAB — AST: AST: 17 U/L (ref 15–41)

## 2016-05-26 LAB — HCG, QUANTITATIVE, PREGNANCY: hCG, Beta Chain, Quant, S: 521 m[IU]/mL — ABNORMAL HIGH (ref <5)

## 2016-05-26 MED ORDER — METHOTREXATE INJECTION FOR WOMEN'S HOSPITAL
50.0000 mg/m2 | Freq: Once | INTRAMUSCULAR | Status: AC
  Administered 2016-05-26: 95 mg via INTRAMUSCULAR
  Filled 2016-05-26: qty 1.9

## 2016-05-26 NOTE — MAU Note (Signed)
Pt to clinic today for quant, sent to MAU because quants not rising appropriately, pt continues to have bleeding.  Denies pain.

## 2016-05-26 NOTE — Discharge Instructions (Signed)
Methotrexate Treatment for an Ectopic Pregnancy °Methotrexate is a medicine that treats ectopic pregnancy by stopping the growth of the fertilized egg. It also helps your body absorb tissue from the egg. This takes between 2 weeks and 6 weeks. Most ectopic pregnancies can be successfully treated with methotrexate if they are detected early enough. °LET YOUR HEALTH CARE PROVIDER KNOW ABOUT: °· Any allergies you have. °· All medicines you are taking, including vitamins, herbs, eye drops, creams, and over-the-counter medicines. °· Medical conditions you have. °RISKS AND COMPLICATIONS °Generally, this is a safe treatment. However, as with any treatment, problems can occur. Possible problems or side effects include: °· Nausea. °· Vomiting. °· Diarrhea. °· Abdominal cramping. °· Mouth sores. °· Increased vaginal bleeding or spotting.   °· Swelling or irritation of the lining of your lungs (pneumonitis).  °· Failed treatment and continuation of the pregnancy.   °· Liver damage. °· Hair loss. °There is still a risk of the ectopic pregnancy rupturing while using the methotrexate. °BEFORE THE PROCEDURE °Before you take the medicine:  °· Liver tests, kidney tests, and a complete blood test are performed. °· Blood tests are performed to measure the pregnancy hormone levels and to determine your blood type. °· If you are Rh-negative and the father is Rh-positive or his Rh type is not known, you will be given a Rho (D) immune globulin shot. °PROCEDURE  °There are two methods that your health care provider may use to prescribe methotrexate. One method involves a single dose or injection of the medicine. Another method involves a series of doses given through several injections.  °AFTER THE PROCEDURE °· You may have some abdominal cramping, vaginal bleeding, and fatigue in the first few days after taking methotrexate. °· Blood tests will be taken for several weeks to check the pregnancy hormone levels. The blood tests are performed  until there is no more pregnancy hormone detected in the blood. °  °This information is not intended to replace advice given to you by your health care provider. Make sure you discuss any questions you have with your health care provider. °  °Document Released: 07/05/2001 Document Revised: 08/01/2014 Document Reviewed: 04/29/2013 °Elsevier Interactive Patient Education ©2016 [employer] ° °Ectopic Pregnancy °An ectopic pregnancy is when the fertilized egg attaches (implants) outside the uterus. Most ectopic pregnancies occur in the fallopian tube. Rarely do ectopic pregnancies occur on the ovary, intestine, pelvis, or cervix. In an ectopic pregnancy, the fertilized egg does not have the ability to develop into a normal, healthy baby.  °A ruptured ectopic pregnancy is one in which the fallopian tube gets torn or bursts and results in internal bleeding. Often there is intense abdominal pain, and sometimes, vaginal bleeding. Having an ectopic pregnancy can be life threatening. If left untreated, this dangerous condition can lead to a blood transfusion, abdominal surgery, or even death. °CAUSES  °Damage to the fallopian tubes is the suspected cause in most ectopic pregnancies.  °RISK FACTORS °Depending on your circumstances, the risk of having an ectopic pregnancy will vary. The level of risk can be divided into three categories. °High Risk °· You have gone through infertility treatment. °· You have had a previous ectopic pregnancy. °· You have had previous tubal surgery. °· You have had previous surgery to have the fallopian tubes tied (tubal ligation). °· You have tubal problems or diseases. °· You have been exposed to DES. DES is a medicine that was used until 1971 and had effects on babies whose mothers took the medicine. °·   You become pregnant while using an intrauterine device (IUD) for birth control.  °Moderate Risk °· You have a history of infertility. °· You have a history of a sexually transmitted infection  (STI). °· You have a history of pelvic inflammatory disease (PID). °· You have scarring from endometriosis. °· You have multiple sexual partners. °· You smoke.  °Low Risk °· You have had previous pelvic surgery. °· You use vaginal douching. °· You became sexually active before 26 years of age. °SIGNS AND SYMPTOMS  °An ectopic pregnancy should be suspected in anyone who has missed a period and has abdominal pain or bleeding. °· You may experience normal pregnancy symptoms, such as: °¨ Nausea. °¨ Tiredness. °¨ Breast tenderness. °· Other symptoms may include: °¨ Pain with intercourse. °¨ Irregular vaginal bleeding or spotting. °¨ Cramping or pain on one side or in the lower abdomen. °¨ Fast heartbeat. °¨ Passing out while having a bowel movement. °· Symptoms of a ruptured ectopic pregnancy and internal bleeding may include: °¨ Sudden, severe pain in the abdomen and pelvis. °¨ Dizziness or fainting. °¨ Pain in the shoulder area. °DIAGNOSIS  °Tests that may be performed include: °· A pregnancy test. °· An ultrasound test. °· Testing the specific level of pregnancy hormone in the bloodstream. °· Taking a sample of uterus tissue (dilation and curettage, D&C). °· Surgery to perform a visual exam of the inside of the abdomen using a thin, lighted tube with a tiny camera on the end (laparoscope). °TREATMENT  °An injection of a medicine called methotrexate may be given. This medicine causes the pregnancy tissue to be absorbed. It is given if: °· The diagnosis is made early. °· The fallopian tube has not ruptured. °· You are considered to be a good candidate for the medicine. °Usually, pregnancy hormone blood levels are checked after methotrexate treatment. This is to be sure the medicine is effective. It may take 4-6 weeks for the pregnancy to be absorbed (though most pregnancies will be absorbed by 3 weeks). °Surgical treatment may be needed. A laparoscope may be used to remove the pregnancy tissue. If severe internal  bleeding occurs, a cut (incision) may be made in the lower abdomen (laparotomy), and the ectopic pregnancy is removed. This stops the bleeding. Part of the fallopian tube, or the whole tube, may be removed as well (salpingectomy). After surgery, pregnancy hormone tests may be done to be sure there is no pregnancy tissue left. You may receive a Rho (D) immune globulin shot if you are Rh negative and the father is Rh positive, or if you do not know the Rh type of the father. This is to prevent problems with any future pregnancy. °SEEK IMMEDIATE MEDICAL CARE IF:  °You have any symptoms of an ectopic pregnancy. This is a medical emergency. °MAKE SURE YOU: °· Understand these instructions. °· Will watch your condition. °· Will get help right away if you are not doing well or get worse. °  °This information is not intended to replace advice given to you by your health care provider. Make sure you discuss any questions you have with your health care provider. °  °Document Released: 08/18/2004 Document Revised: 08/01/2014 Document Reviewed: 02/07/2013 °Elsevier Interactive Patient Education ©2016 Elsevier Inc. ° °

## 2016-05-26 NOTE — Progress Notes (Signed)
Patient presented to office today for a STAT HCG. Patient does complain of some pain in abdominal area. U/S on the 10/31/2017did not show a IUP however could not rule out ectopic pregnancy. Per Dr.Ervin since HCG  level are not rising at a normal range. Dr.Ervin has suggested she go to MAU to be seen. Patient voice understanding at this time.   [facility] aware she would be coming to be seen.

## 2016-05-26 NOTE — MAU Provider Note (Signed)
History   [id number]   Chief Complaint  Patient presents with  . Vaginal Bleeding    HPI Sandra Anderson is a 25 y.o. female [phone] here for follow-up BHCG.  Upon review of the records patient was first seen on 10/31 for abdominal pain & vaginal bleeding. BHCG on that day was 467.  Ultrasound showed no iup; right adnexal mass concerning for ectopic. GC/CT and wet prep were collected.  Results were negative. Pt was offered MTX by requested additional BHCG check so was  discharged home and followed up at [facility] today for bhcg. Pt was sent to MAU for evaluation d/t inappropriate rise in BHCG. Patient currently reports vaginal spotting and denies abdominal pain (although the note from [facility] has the patient reporting +abdominal pain and no vaginal bleeding).    Patient's last menstrual period was 04/09/2016 (approximate).  OB History  Gravida Para Term Preterm AB Living  _0 0   3  SAB TAB Ectopic Multiple Live Births        0 3    # Outcome Date GA Lbr Len/2nd Weight Sex Delivery Anes PTL Lv  4 Current           3 Term 03/19/15 66w2d04:07 / 00:04 7 lb 7.8 oz (3.395 kg) F Vag-Spont EPI  LIV     Birth Comments: WNL  2 Term 05/04/11 373w1d6:25 / 00:11 6 lb 13 oz (3.09 kg) M Vag-Spont EPI  LIV  1 Term 05/12/10 405w0dM Vag-Spont None N LIV      Past Medical History:  Diagnosis Date  . Chlamydia contact, treated   . Depression    pp depression after both children    Family History  Problem Relation Age of Onset  . Hypertension Mother   . Cirrhosis Mother     Social History   Social History  . Marital status: Single    Spouse name: N/A  . Number of children: N/A  . Years of education: N/A   Social History Main Topics  . Smoking status: Former Smoker    Packs/day: 0.50    Years: 2.00    Types: Cigarettes    Quit date: 01/10/2015  . Smokeless tobacco: Never Used  . Alcohol use No  . Drug use: No  . Sexual activity: Yes    Birth control/ protection: None   Other  Topics Concern  . Not on file   Social History Narrative  . No narrative on file    No Known Allergies  No current facility-administered medications on file prior to encounter.    No current outpatient prescriptions on file prior to encounter.     Physical Exam   Vitals:   05/26/16 1350 05/26/16 1444  BP: 127/79   Pulse: 67   Resp: 16   Temp: 98 F (36.7 C)   TempSrc: Oral   Weight:  161 lb (73 kg)  Height:  _1  (1.702 m)    Physical Exam  Nursing note and vitals reviewed. Constitutional: She is oriented to person, place, and time. She appears well-developed and well-nourished. No distress.  HENT:  Head: Normocephalic and atraumatic.  Eyes: Conjunctivae are normal. Right eye exhibits no discharge. Left eye exhibits no discharge. No scleral icterus.  Neck: Normal range of motion.  Respiratory: Effort normal. No respiratory distress.  GI: Soft. Bowel sounds are normal. There is no tenderness.  Neurological: She is alert and oriented to person, place, and time.  Skin: Skin  is warm and dry. She is not diaphoretic.  Psychiatric: She has a normal mood and affect. Her behavior is normal. Judgment and thought content normal.    MAU Course  Procedures Component     Latest Ref Rng & Units 05/24/2016 05/26/2016  HCG, Beta Chain, Quant, S     <5 mIU/mL 467 (H) 521 (H)   Results for orders placed or performed during the hospital encounter of 05/26/16 (from the past 48 hour(s))  CBC WITH DIFFERENTIAL     Status: None   Collection Time: 05/26/16  3:00 PM  Result Value Ref Range   WBC 5.7 4.0 - 10.5 K/uL   RBC 4.74 3.87 - 5.11 MIL/uL   Hemoglobin 12.9 12.0 - 15.0 g/dL   HCT 38.2 36.0 - 46.0 %   MCV 80.6 78.0 - 100.0 fL   MCH 27.2 26.0 - 34.0 pg   MCHC 33.8 30.0 - 36.0 g/dL   RDW 14.3 11.5 - 15.5 %   Platelets 327 150 - 400 K/uL   Neutrophils Relative % 48 %   Neutro Abs 2.7 1.7 - 7.7 K/uL   Lymphocytes Relative 46 %   Lymphs Abs 2.6 0.7 - 4.0 K/uL   Monocytes  Relative 4 %   Monocytes Absolute 0.2 0.1 - 1.0 K/uL   Eosinophils Relative 1 %   Eosinophils Absolute 0.1 0.0 - 0.7 K/uL   Basophils Relative 1 %   Basophils Absolute 0.0 0.0 - 0.1 K/uL  AST     Status: None   Collection Time: 05/26/16  3:00 PM  Result Value Ref Range   AST 17 15 - 41 U/L  BUN     Status: None   Collection Time: 05/26/16  3:00 PM  Result Value Ref Range   BUN 11 6 - 20 mg/dL  Creatinine, serum     Status: None   Collection Time: 05/26/16  3:00 PM  Result Value Ref Range   Creatinine, Ser 0.64 0.44 - 1.00 mg/dL   GFR calc non Af Amer >60 >60 mL/min   GFR calc Af Amer >60 >60 mL/min    Comment: (NOTE) The eGFR has been calculated using the CKD EPI equation. This calculation has not been validated in all clinical situations. eGFR's persistently <60 mL/min signify possible Chronic Kidney Disease.      MDM Inappropriate rise in BHCG in presence of suspicious adnexal mass; likely ectopic pregnancy. Discussed treatment of MTX for ectopic pregnancy. Patient very hesitant regarding treatment and questioning whether she can "just take prenatal vitamins to fix where the baby is". After lengthy discussion with patient, Dr. Harolyn Rutherford spoke with patient. Patient informed of risks for untreated ectopic pregnancy including emergency surgery, loss of fertility, and death. Pt ultimately agreed to treatment. Labs normal & MTX given  Assessment and Plan  25 y.o. G4P3003 at 70w5dwks Pregnancy Follow-up BHCG 1. Right tubal pregnancy without intrauterine pregnancy   2. Inappropriate change in quantitative hCG in early pregnancy   3. Pregnancy of unknown anatomic location    P: Return to MAU Sunday for day 4 BHCG D/c prenatal vitamins Take tylenol prn pain Discussed reasons to return to MAU for s/s of rupture ectopic   EJorje Guild NP 05/26/2016 3:51 PM

## 2016-05-29 ENCOUNTER — Inpatient Hospital Stay (HOSPITAL_COMMUNITY)
Admission: AD | Admit: 2016-05-29 | Discharge: 2016-05-29 | Disposition: A | Discharge status: Home / Self Care | Payer: Medicaid Other | Source: Ambulatory Visit | Attending: Obstetrics and Gynecology | Admitting: Obstetrics and Gynecology

## 2016-05-29 DIAGNOSIS — O009 Unspecified ectopic pregnancy without intrauterine pregnancy: Secondary | ICD-10-CM | POA: Insufficient documentation

## 2016-05-29 DIAGNOSIS — O00101 Right tubal pregnancy without intrauterine pregnancy: Secondary | ICD-10-CM | POA: Diagnosis not present

## 2016-05-29 LAB — HCG, QUANTITATIVE, PREGNANCY: hCG, Beta Chain, Quant, S: 89 m[IU]/mL — ABNORMAL HIGH (ref <5)

## 2016-05-29 NOTE — MAU Note (Signed)
Pt presents to MAU for repeat BHCG post methotrexate on November the 2nd. Denies any pain, reports scant amount of vaginal bleeding, states it has slowed down since she was evaluated on Thursday

## 2016-05-29 NOTE — MAU Provider Note (Signed)
History   Chief Complaint:  BHCG   Sandra Anderson is  25 y.o. [phone] Patient's last menstrual period was 04/09/2016 (approximate).. Patient is here for follow up of quantitative HCG after methotrexate given on Thursday, 05-26-16 after diagnosis of ectopic pregnancy.    Since her last visit, the patient is without new complaint.   The patient reports bleeding as a scant amount.  ROS: Small aount of vaginal bleeding.  No abdominal pain.  No fever.  No other complaint.  Her previous Quantitative HCG values are: 05-23-16 - 467 05-26-16 - 521    Physical Exam   Blood pressure 125/67, pulse 76, temperature 97.8 F (36.6 C), resp. rate 18, last menstrual period 04/09/2016, not currently breastfeeding.  GENERAL: Well-developed, well-nourished female in no acute distress.  HEENT: Normocephalic, atraumatic.  LUNGS: Effort normal  HEART: Regular rate  SKIN: Warm, dry and without erythema  PSYCH: Normal mood and affect   Labs: Results for orders placed or performed during the hospital encounter of 05/29/16 (from the past 24 hour(s))  hCG, quantitative, pregnancy   Collection Time: 05/29/16 10:20 AM  Result Value Ref Range   hCG, Beta Chain, Quant, S 89 (H) <5 mIU/mL    Ultrasound Studies:   [place] Ob Comp Less 14 Wks  Result Date: 05/24/2016 CLINICAL DATA:  First trimester vaginal bleeding. EXAM: OBSTETRIC <14 WK US AND TRANSVAGINAL OB US TECHNIQUE: Both transabdominal and transvaginal ultrasound examinations were performed for complete evaluation of the gestation as well as the maternal uterus, adnexal regions, and pelvic cul-de-sac. Transvaginal technique was performed to assess early pregnancy. COMPARISON:  None FINDINGS: Intrauterine gestational sac: None Yolk sac:  None Embryo:  None Subchorionic hemorrhage:  None visualized. Maternal uterus/adnexae: Right ovary: Normal Left ovary: Contains a corpus luteum. Other :Within the right adnexa there is a solid-appearing rounded structure  measuring 1.2 x 0.7 x 1.0 cm with associated increased blood flow. Free fluid:  Trace free fluid noted within the pelvis. IMPRESSION: 1. No intrauterine pregnancy. 2. Solid-appearing structure within the right adnexa adjacent to the right ovary measuring 1.2 cm with increased blood flow. Cannot rule out ectopic pregnancy. 3. Critical Value/emergent results were called by telephone at the time of interpretation on 05/24/2016 at 10:36 am to Dr. Judeth HornERIN Anderson , who verbally acknowledged these results. Electronically Signed   By: Signa Kellaylor  Stroud M.D.   On: 05/24/2016 10:37   [place] Ob Transvaginal  Result Date: 05/24/2016 CLINICAL DATA:  First trimester vaginal bleeding. EXAM: OBSTETRIC <14 WK US AND TRANSVAGINAL OB US TECHNIQUE: Both transabdominal and transvaginal ultrasound examinations were performed for complete evaluation of the gestation as well as the maternal uterus, adnexal regions, and pelvic cul-de-sac. Transvaginal technique was performed to assess early pregnancy. COMPARISON:  None FINDINGS: Intrauterine gestational sac: None Yolk sac:  None Embryo:  None Subchorionic hemorrhage:  None visualized. Maternal uterus/adnexae: Right ovary: Normal Left ovary: Contains a corpus luteum. Other :Within the right adnexa there is a solid-appearing rounded structure measuring 1.2 x 0.7 x 1.0 cm with associated increased blood flow. Free fluid:  Trace free fluid noted within the pelvis. IMPRESSION: 1. No intrauterine pregnancy. 2. Solid-appearing structure within the right adnexa adjacent to the right ovary measuring 1.2 cm with increased blood flow. Cannot rule out ectopic pregnancy. 3. Critical Value/emergent results were called by telephone at the time of interpretation on 05/24/2016 at 10:36 am to Dr. Judeth HornERIN Anderson , who verbally acknowledged these results. Electronically Signed   By: Signa Kellaylor  Stroud M.D.   On:  05/24/2016 10:37    Assessment:  Falling quants secondary to methotrexate given for ectopic pregnancy    Plan: The patient is instructed to follow up on Wednesday [facility] for repeat BHCG. Expect to be followed weekly until the hormone level is zero. Return if you have severe vaginal bleeding or severe abdominal pain. Continue ectopic precautions.  Sandra Anderson 05/29/2016, 11:40 AM

## 2016-06-01 ENCOUNTER — Encounter: Payer: Self-pay | Admitting: General Practice

## 2016-06-01 ENCOUNTER — Telehealth: Payer: Self-pay | Admitting: General Practice

## 2016-06-01 ENCOUNTER — Ambulatory Visit: Payer: Medicaid Other

## 2016-06-01 NOTE — Telephone Encounter (Signed)
Patient no showed for stat bhcg today. Called patient, no answer- left message stating we are trying to reach you as you missed your lab appt with us today. Please call the front office staff to schedule this appt. Will send letter

## 2016-11-17 ENCOUNTER — Ambulatory Visit (INDEPENDENT_AMBULATORY_CARE_PROVIDER_SITE_OTHER): Payer: Medicaid Other

## 2016-11-17 DIAGNOSIS — Z3202 Encounter for pregnancy test, result negative: Secondary | ICD-10-CM

## 2016-11-17 DIAGNOSIS — Z3201 Encounter for pregnancy test, result positive: Secondary | ICD-10-CM | POA: Diagnosis present

## 2016-11-17 LAB — POCT PREGNANCY, URINE: Preg Test, Ur: NEGATIVE

## 2016-11-17 NOTE — Progress Notes (Signed)
Patient presented to office today for a pregnancy test. Test confirms she is not pregnant at this time. I advised patient to make an appointment if she is interested in starting birth control. Patient stated she would think about this and call at a later time.

## 2017-01-30 ENCOUNTER — Ambulatory Visit (INDEPENDENT_AMBULATORY_CARE_PROVIDER_SITE_OTHER): Payer: Medicaid Other | Admitting: *Deleted

## 2017-01-30 ENCOUNTER — Encounter: Payer: Self-pay | Admitting: *Deleted

## 2017-01-30 DIAGNOSIS — Z3201 Encounter for pregnancy test, result positive: Secondary | ICD-10-CM | POA: Diagnosis not present

## 2017-01-30 DIAGNOSIS — Z3200 Encounter for pregnancy test, result unknown: Secondary | ICD-10-CM

## 2017-01-30 LAB — POCT PREGNANCY, URINE: Preg Test, Ur: POSITIVE — AB

## 2017-01-30 NOTE — Progress Notes (Signed)
Here for pregnancy test which was positive. States periods are regular and LMP 12/25/16 or a day or two before /after which makes her EDD 10/01/17. She is not sure if or where she will receive prenatal care or if she will keep pregnancy. Proof of pregnancy letter given. Reviewed meds with her and instructed to not take any meds without checking with her prenatal provider.  Sent to front desk to check out with registar and be given prenatal care numbers for our office, health dept. Etc.

## 2017-01-31 ENCOUNTER — Inpatient Hospital Stay (HOSPITAL_COMMUNITY): Payer: Medicaid Other

## 2017-01-31 ENCOUNTER — Encounter (HOSPITAL_COMMUNITY): Payer: Self-pay | Admitting: *Deleted

## 2017-01-31 ENCOUNTER — Inpatient Hospital Stay (HOSPITAL_COMMUNITY)
Admission: AD | Admit: 2017-01-31 | Discharge: 2017-01-31 | Disposition: A | Discharge status: Home / Self Care | Payer: Medicaid Other | Source: Ambulatory Visit | Attending: Family Medicine | Admitting: Family Medicine

## 2017-01-31 DIAGNOSIS — B9689 Other specified bacterial agents as the cause of diseases classified elsewhere: Secondary | ICD-10-CM | POA: Insufficient documentation

## 2017-01-31 DIAGNOSIS — Z3490 Encounter for supervision of normal pregnancy, unspecified, unspecified trimester: Secondary | ICD-10-CM

## 2017-01-31 DIAGNOSIS — O0911 Supervision of pregnancy with history of ectopic or molar pregnancy, first trimester: Secondary | ICD-10-CM | POA: Insufficient documentation

## 2017-01-31 DIAGNOSIS — R1030 Lower abdominal pain, unspecified: Secondary | ICD-10-CM | POA: Diagnosis present

## 2017-01-31 DIAGNOSIS — N760 Acute vaginitis: Secondary | ICD-10-CM | POA: Diagnosis not present

## 2017-01-31 DIAGNOSIS — O26899 Other specified pregnancy related conditions, unspecified trimester: Secondary | ICD-10-CM

## 2017-01-31 DIAGNOSIS — Z87891 Personal history of nicotine dependence: Secondary | ICD-10-CM | POA: Diagnosis not present

## 2017-01-31 DIAGNOSIS — R109 Unspecified abdominal pain: Secondary | ICD-10-CM | POA: Diagnosis not present

## 2017-01-31 DIAGNOSIS — O99341 Other mental disorders complicating pregnancy, first trimester: Secondary | ICD-10-CM | POA: Diagnosis not present

## 2017-01-31 DIAGNOSIS — Z3A01 Less than 8 weeks gestation of pregnancy: Secondary | ICD-10-CM | POA: Insufficient documentation

## 2017-01-31 DIAGNOSIS — O26891 Other specified pregnancy related conditions, first trimester: Secondary | ICD-10-CM | POA: Insufficient documentation

## 2017-01-31 DIAGNOSIS — O23591 Infection of other part of genital tract in pregnancy, first trimester: Secondary | ICD-10-CM | POA: Diagnosis not present

## 2017-01-31 DIAGNOSIS — F329 Major depressive disorder, single episode, unspecified: Secondary | ICD-10-CM | POA: Diagnosis not present

## 2017-01-31 LAB — CBC
HCT: 41 % (ref 36.0–46.0)
Hemoglobin: 13.5 g/dL (ref 12.0–15.0)
MCH: 27.4 pg (ref 26.0–34.0)
MCHC: 32.9 g/dL (ref 30.0–36.0)
MCV: 83.2 fL (ref 78.0–100.0)
Platelets: 293 K/uL (ref 150–400)
RBC: 4.93 MIL/uL (ref 3.87–5.11)
RDW: 14.3 % (ref 11.5–15.5)
WBC: 10.1 K/uL (ref 4.0–10.5)

## 2017-01-31 LAB — URINALYSIS, ROUTINE W REFLEX MICROSCOPIC
Bilirubin Urine: NEGATIVE
Glucose, UA: NEGATIVE mg/dL
Hgb urine dipstick: NEGATIVE
Ketones, ur: NEGATIVE mg/dL
Nitrite: NEGATIVE
Protein, ur: NEGATIVE mg/dL
Specific Gravity, Urine: 1.018 (ref 1.005–1.030)
pH: 6 (ref 5.0–8.0)

## 2017-01-31 LAB — WET PREP, GENITAL
Sperm: NONE SEEN
Trich, Wet Prep: NONE SEEN
Yeast Wet Prep HPF POC: NONE SEEN

## 2017-01-31 LAB — HCG, QUANTITATIVE, PREGNANCY: hCG, Beta Chain, Quant, S: 9166 m[IU]/mL — ABNORMAL HIGH (ref <5)

## 2017-01-31 LAB — ABO/RH: ABO/RH(D): O POS

## 2017-01-31 MED ORDER — PRENATAL VITAMIN 27-0.8 MG PO TABS: 1.0000 | ORAL_TABLET | Freq: Every day | ORAL | 5 refills | Status: DC

## 2017-01-31 MED ORDER — METRONIDAZOLE 0.75 % VA GEL: 1.0000 | Freq: Two times a day (BID) | VAGINAL | 0 refills | Status: AC

## 2017-01-31 NOTE — MAU Provider Note (Signed)
Chief Complaint: Abdominal Pain   SUBJECTIVE HPI: Sandra Anderson is a 26 y.o. G5P3003 at Unknown who presents to [facility]aternity Admissions reporting abdominal pain with +HPT.  Patient has had pain x1 week, lower abdominal pain. Patient's last menstrual period was 12/25/2016. Denies VB. Pain is mostly at nighttime, not able to sleep, 7/10 at worst. Had an ectopic last year, used methotrexate that was successful in aborting. Denies F/C. H/o GC and CT about 3 years ago, nothing recently. Not on birth control. Has frequent BV and yeast infections. Been with partner for 3-4 years.      Past Medical History:  Diagnosis Date  . Chlamydia contact, treated   . Depression    pp depression after both children   OB History  Gravida Para Term Preterm AB Living  5 3 3  0   3  SAB TAB Ectopic Multiple Live Births        0 3    # Outcome Date GA Lbr Len/2nd Weight Sex Delivery Anes PTL Lv  5 Current           4 Term 03/19/15 [redacted]w[redacted]d 04:07 / 00:04 7 lb 7.8 oz (3.395 kg) F Vag-Spont EPI  LIV     Birth Comments: WNL  3 Term 05/04/11 [redacted]w[redacted]d 06:25 / 00:11 6 lb 13 oz (3.09 kg) M Vag-Spont EPI  LIV  2 Term 05/12/10 [redacted]w[redacted]d   M Vag-Spont None N LIV  1 Gravida              Past Surgical History:  Procedure Laterality Date  . NO PAST SURGERIES     Social History   Social History  . Marital status: Single    Spouse name: N/A  . Number of children: N/A  . Years of education: N/A   Occupational History  . Not on file.   Social History Main Topics  . Smoking status: Former Smoker    Packs/day: 0.50    Years: 2.00    Types: Cigarettes    Quit date: 01/10/2015  . Smokeless tobacco: Never Used  . Alcohol use No  . Drug use: No  . Sexual activity: Yes    Birth control/ protection: None   Other Topics Concern  . Not on file   Social History Narrative  . No narrative on file   No current facility-administered medications on file prior to encounter.    No current outpatient prescriptions on file  prior to encounter.   No Known Allergies  I have reviewed the past Medical Hx, Surgical Hx, Social Hx, Allergies and Medications.   REVIEW OF SYSTEMS  A comprehensive ROS was negative except per HPI.   OBJECTIVE Patient Vitals for the past 24 hrs:  BP Temp Temp src Pulse Resp SpO2 Height Weight  01/31/17 0954 130/83 98.9 F (37.2 C) Oral 68 16 100 % 5\' 7"  (1.702 m) 162 lb 4 oz (73.6 kg)    PHYSICAL EXAM Constitutional: Well-developed, well-nourished female in no acute distress.  Cardiovascular: normal rate, rhythm, no murmurs Respiratory: normal rate and effort. CTAB GI: Abd soft, non-tender, non-distended. Pos BS x 4 MS: Extremities nontender, no edema, normal ROM Neurologic: Alert and oriented x 4.  GU: Neg CVAT. SPECULUM EXAM: NEFG, physiologic discharge, no blood noted, cervix clean BIMANUAL: cervix closed; uterus normal size, no adnexal masses. Right adnexal tenderness on exam. No CMT.  LAB RESULTS Results for orders placed or performed during the hospital encounter of 01/31/17 (from the past 24 hour(s))  Urinalysis, Routine w reflex  microscopic     Status: Abnormal   Collection Time: 01/31/17  9:58 AM  Result Value Ref Range   Color, Urine YELLOW YELLOW   APPearance CLEAR CLEAR   Specific Gravity, Urine 1.018 1.005 - 1.030   pH 6.0 5.0 - 8.0   Glucose, UA NEGATIVE NEGATIVE mg/dL   Hgb urine dipstick NEGATIVE NEGATIVE   Bilirubin Urine NEGATIVE NEGATIVE   Ketones, ur NEGATIVE NEGATIVE mg/dL   Protein, ur NEGATIVE NEGATIVE mg/dL   Nitrite NEGATIVE NEGATIVE   Leukocytes, UA TRACE (A) NEGATIVE   RBC / HPF 0-5 0 - 5 RBC/hpf   WBC, UA 0-5 0 - 5 WBC/hpf   Bacteria, UA RARE (A) NONE SEEN   Squamous Epithelial / LPF 6-30 (A) NONE SEEN   Mucous PRESENT   CBC     Status: None   Collection Time: 01/31/17 10:35 AM  Result Value Ref Range   WBC 10.1 4.0 - 10.5 K/uL   RBC 4.93 3.87 - 5.11 MIL/uL   Hemoglobin 13.5 12.0 - 15.0 g/dL   HCT 16.1 09.6 - 04.5 %   MCV 83.2 78.0  - 100.0 fL   MCH 27.4 26.0 - 34.0 pg   MCHC 32.9 30.0 - 36.0 g/dL   RDW 40.9 81.1 - 91.4 %   Platelets 293 150 - 400 K/uL  hCG, quantitative, pregnancy     Status: Abnormal   Collection Time: 01/31/17 10:35 AM  Result Value Ref Range   hCG, Beta Chain, Quant, S 9,166 (H) <5 mIU/mL  ABO/Rh     Status: None   Collection Time: 01/31/17 10:35 AM  Result Value Ref Range   ABO/RH(D) O POS   Wet prep, genital     Status: Abnormal   Collection Time: 01/31/17 10:35 AM  Result Value Ref Range   Yeast Wet Prep HPF POC NONE SEEN NONE SEEN   Trich, Wet Prep NONE SEEN NONE SEEN   Clue Cells Wet Prep HPF POC PRESENT (A) NONE SEEN   WBC, Wet Prep HPF POC MANY (A) NONE SEEN   Sperm NONE SEEN     IMAGING US Ob Comp Less 14 Wks  Result Date: 01/31/2017 CLINICAL DATA:  26 year old pregnant female presents with 1 week of right lower quadrant pain and cramping. Quantitative beta HCG 9,166. EDC by LMP: 10/01/2017, projecting to an expected gestational age of [redacted] weeks 2 days. EXAM: OBSTETRIC <14 WK [place] AND TRANSVAGINAL OB US TECHNIQUE: Both transabdominal and transvaginal ultrasound examinations were performed for complete evaluation of the gestation as well as the maternal uterus, adnexal regions, and pelvic cul-de-sac. Transvaginal technique was performed to assess early pregnancy. COMPARISON:  No prior scans from this gestation. FINDINGS: Intrauterine gestational sac: Single intrauterine gestational sac appears normal in position and shape. Yolk sac:  Visualized. Embryo:  Not Visualized. Embryonic Cardiac Activity: Not Visualized. MSD: 8.7  mm   5 w   4  d Subchorionic hemorrhage:  None visualized. Maternal uterus/adnexae: No uterine fibroids. Right ovary measures 3.6 x 2.1 x 2.6 cm and contains a corpus luteum. Left ovary measures 2.5 x 1.0 x 1.3 cm. No suspicious ovarian or adnexal masses. No abnormal free fluid in the pelvis. IMPRESSION: 1. Single intrauterine gestational sac with yolk sac at 5 weeks 4 days by  mean sac diameter. No embryo visualized at this time, which could be due to early gestational age. Recommend follow-up US in 11-14 days for definitive assessment of pregnancy viability. This recommendation follows SRU consensus guidelines: Diagnostic Criteria for Nonviable  Pregnancy Early in the First Trimester. Malva Limes Engl J Med 2013; 829:5621-30; 369:1443-51. 2. No ovarian or adnexal abnormality. Electronically Signed   By: Delbert PhenixJason A Poff M.D.   On: 01/31/2017 12:08   [place] Ob Transvaginal  Result Date: 01/31/2017 CLINICAL DATA:  26 year old pregnant female presents with 1 week of right lower quadrant pain and cramping. Quantitative beta HCG 9,166. EDC by LMP: 10/01/2017, projecting to an expected gestational age of [redacted] weeks 2 days. EXAM: OBSTETRIC <14 WK US AND TRANSVAGINAL OB US TECHNIQUE: Both transabdominal and transvaginal ultrasound examinations were performed for complete evaluation of the gestation as well as the maternal uterus, adnexal regions, and pelvic cul-de-sac. Transvaginal technique was performed to assess early pregnancy. COMPARISON:  No prior scans from this gestation. FINDINGS: Intrauterine gestational sac: Single intrauterine gestational sac appears normal in position and shape. Yolk sac:  Visualized. Embryo:  Not Visualized. Embryonic Cardiac Activity: Not Visualized. MSD: 8.7  mm   5 w   4  d Subchorionic hemorrhage:  None visualized. Maternal uterus/adnexae: No uterine fibroids. Right ovary measures 3.6 x 2.1 x 2.6 cm and contains a corpus luteum. Left ovary measures 2.5 x 1.0 x 1.3 cm. No suspicious ovarian or adnexal masses. No abnormal free fluid in the pelvis. IMPRESSION: 1. Single intrauterine gestational sac with yolk sac at 5 weeks 4 days by mean sac diameter. No embryo visualized at this time, which could be due to early gestational age. Recommend follow-up US in 11-14 days for definitive assessment of pregnancy viability. This recommendation follows SRU consensus guidelines: Diagnostic Criteria for  Nonviable Pregnancy Early in the First Trimester. Malva Limes Engl J Med 2013; 865:7846-96; 369:1443-51. 2. No ovarian or adnexal abnormality. Electronically Signed   By: Delbert PhenixJason A Poff M.D.   On: 01/31/2017 12:08    MAU COURSE CBC ABO/Rh BHCG TVUS/AUS - +GS, +YS, no embryo seen; normal adnexa and ovaries; 1059w4d early pregnancy VSS SSE/SVE - normal GC/CT - pending Wet prep +BV   MDM Plan of care reviewed with patient, including labs and tests ordered and medical treatment. Discussed with patient that she has an early pregnancy seen in the uterus, no signs of ectopic noted. There is a yolk sac seen. Follow-up in OB providers office of her choice for initial OB visit. Bleeding precautions and pain precautions given. Discussed positive bacterial vaginosis diagnosis, patient states she has is frequently, treatment sent to pharmacy. Okay for discharge.   ASSESSMENT 1. Abdominal pain affecting pregnancy   2. Abdominal pain during pregnancy in first trimester   3. Pregnancy at early stage   4. Bacterial vaginosis     PLAN Discharge home in stable condition. Metrogel Bleeding and pain precautions given PNV   Follow-up Information    [facility] Schedule an appointment as soon as possible for a visit in 2 week(s).   Specialty:  Obstetrics and Gynecology Why:  For initial OB appointment Contact information: [address] [place] [phone] [phone]         Allergies as of 01/31/2017   No Known Allergies     Medication List    TAKE these medications   metroNIDAZOLE 0.75 % vaginal gel Commonly known as:  METROGEL VAGINAL Place 1 Applicatorful vaginally 2 (two) times daily. Insert for 5 days.   Prenatal Vitamin 27-0.8 MG Tabs Take 1 tablet by mouth daily.        Jen MowElizabeth Jaelie Aguilera, DO OB Fellow 01/31/2017 12:38 PM

## 2017-01-31 NOTE — MAU Note (Signed)
Has ben feeling very uncomfortable.  Having a lot of pain, cramping for past wk. Hx of ectopic, wants to make sure everything is ok.

## 2017-01-31 NOTE — Progress Notes (Addendum)
G5P3 @ [redacted]wksga? Confirmed pregnancy downstairs two days ago. Cramping started for a week off and on. Worse at night. Pain feels same like when she had ectopic last year. txed with methatrexate. Denies bleeding. VSS see flow sheet for details.   1020: provider at bs assessing pt.   1035: wet prep and GC done. SVE closed.   1145: back from U/S. Pending results  1238: Dr. Omer JackMumaw at bs reassessing. Discharge orders received]  Discharge orders given with pt understanding. Pt left unit via ambulatory with family.

## 2017-01-31 NOTE — Discharge Instructions (Signed)
Abdominal Pain During Pregnancy °Abdominal pain is common in pregnancy. Most of the time, it does not cause harm. There are many causes of abdominal pain. Some causes are more serious than others and sometimes the cause is not known. Abdominal pain can be a sign that something is very wrong with the pregnancy or the pain may have nothing to do with the pregnancy. Always tell your health care provider if you have any abdominal pain. °Follow these instructions at home: °· Do not have sex or put anything in your vagina until your symptoms go away completely. °· Watch your abdominal pain for any changes. °· Get plenty of rest until your pain improves. °· Drink enough fluid to keep your urine clear or pale yellow. °· Take over-the-counter or prescription medicines only as told by your health care provider. °· Keep all follow-up visits as told by your health care provider. This is important. °Contact a health care provider if: °· You have a fever. °· Your pain gets worse or you have cramping. °· Your pain continues after resting. °Get help right away if: °· You are bleeding, leaking fluid, or passing tissue from the vagina. °· You have vomiting or diarrhea that does not go away. °· You have painful or bloody urination. °· You notice a decrease in your baby's movements. °· You feel very weak or faint. °· You have shortness of breath. °· You develop a severe headache with abdominal pain. °· You have abnormal vaginal discharge with abdominal pain. °This information is not intended to replace advice given to you by your health care provider. Make sure you discuss any questions you have with your health care provider. °Document Released: 07/11/2005 Document Revised: 04/21/2016 Document Reviewed: 02/07/2013 °Elsevier Interactive Patient Education © 2018 Elsevier Inc. ° °Vaginal Bleeding During Pregnancy, First Trimester °A small amount of bleeding (spotting) from the vagina is common in early pregnancy. Sometimes the bleeding  is normal and is not a problem, and sometimes it is a sign of something serious. Be sure to tell your doctor about any bleeding from your vagina right away. °Follow these instructions at home: °· Watch your condition for any changes. °· Follow your doctor's instructions about how active you can be. °· If you are on bed rest: °? You may need to stay in bed and only get up to use the bathroom. °? You may be allowed to do some activities. °? If you need help, make plans for someone to help you. °· Write down: °? The number of pads you use each day. °? How often you change pads. °? How soaked (saturated) your pads are. °· Do not use tampons. °· Do not douche. °· Do not have sex or orgasms until your doctor says it is okay. °· If you pass any tissue from your vagina, save the tissue so you can show it to your doctor. °· Only take medicines as told by your doctor. °· Do not take aspirin because it can make you bleed. °· Keep all follow-up visits as told by your doctor. °Contact a doctor if: °· You bleed from your vagina. °· You have cramps. °· You have labor pains. °· You have a fever that does not go away after you take medicine. °Get help right away if: °· You have very bad cramps in your back or belly (abdomen). °· You pass large clots or tissue from your vagina. °· You bleed more. °· You feel light-headed or weak. °· You pass out (faint). °·   You have chills. °· You are leaking fluid or have a gush of fluid from your vagina. °· You pass out while pooping (having a bowel movement). °This information is not intended to replace advice given to you by your health care provider. Make sure you discuss any questions you have with your health care provider. °Document Released: 11/25/2013 Document Revised: 12/17/2015 Document Reviewed: 03/18/2013 °Elsevier Interactive Patient Education © 2018 Elsevier Inc. ° °

## 2017-02-02 LAB — GC/CHLAMYDIA PROBE AMP (~~LOC~~) NOT AT ARMC
Chlamydia: NEGATIVE
Neisseria Gonorrhea: NEGATIVE

## 2017-02-23 ENCOUNTER — Other Ambulatory Visit (HOSPITAL_COMMUNITY): Payer: Self-pay | Admitting: Nurse Practitioner

## 2017-02-23 DIAGNOSIS — Z3A13 13 weeks gestation of pregnancy: Secondary | ICD-10-CM

## 2017-02-23 DIAGNOSIS — Z3682 Encounter for antenatal screening for nuchal translucency: Secondary | ICD-10-CM

## 2017-03-03 ENCOUNTER — Ambulatory Visit (INDEPENDENT_AMBULATORY_CARE_PROVIDER_SITE_OTHER): Payer: Medicaid Other | Admitting: Certified Nurse Midwife

## 2017-03-03 ENCOUNTER — Encounter: Payer: Self-pay | Admitting: Certified Nurse Midwife

## 2017-03-03 VITALS — BP 121/80 | HR 62 | Wt 165.0 lb

## 2017-03-03 DIAGNOSIS — O0990 Supervision of high risk pregnancy, unspecified, unspecified trimester: Secondary | ICD-10-CM | POA: Insufficient documentation

## 2017-03-03 DIAGNOSIS — Z3482 Encounter for supervision of other normal pregnancy, second trimester: Secondary | ICD-10-CM

## 2017-03-03 DIAGNOSIS — Z3480 Encounter for supervision of other normal pregnancy, unspecified trimester: Secondary | ICD-10-CM

## 2017-03-03 DIAGNOSIS — O219 Vomiting of pregnancy, unspecified: Secondary | ICD-10-CM

## 2017-03-03 DIAGNOSIS — Z8759 Personal history of other complications of pregnancy, childbirth and the puerperium: Secondary | ICD-10-CM | POA: Insufficient documentation

## 2017-03-03 MED ORDER — DOXYLAMINE-PYRIDOXINE ER 20-20 MG PO TBCR: 1.0000 | EXTENDED_RELEASE_TABLET | Freq: Two times a day (BID) | ORAL | 6 refills | Status: DC

## 2017-03-03 MED ORDER — PRENATE PIXIE 10-0.6-0.4-200 MG PO CAPS: 1.0000 | ORAL_CAPSULE | Freq: Every day | ORAL | 12 refills | Status: DC

## 2017-03-03 NOTE — Progress Notes (Addendum)
Subjective:    Sandra Anderson is being seen today for her first obstetrical visit.  This is a planned pregnancy. She is at 7627w5d gestation. Her obstetrical history is significant for last pregnancy was ectopic, treated early with Medications, Hx of IUGR in previous pregnancy. Relationship with FOB: significant other, living together. Patient does intend to breast feed. Pregnancy history fully reviewed.  The information documented in the HPI was reviewed and verified.  Menstrual History: OB History    Gravida Para Term Preterm AB Living   5 3 3  0 1 3   SAB TAB Ectopic Multiple Live Births       1 0 3       Patient's last menstrual period was 12/25/2016.    Past Medical History:  Diagnosis Date  . Chlamydia contact, treated   . 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 MOUTH SURGERY    . NO PAST SURGERIES       (Not in a hospital admission) No Known Allergies  Social History  Substance Use Topics  . Smoking status: Former Smoker    Packs/day: 0.50    Years: 2.00    Types: Cigarettes    Quit date: 01/10/2015  . Smokeless tobacco: Never Used  . Alcohol use No    Family History  Problem Relation Age of Onset  . Hypertension Mother   . Cirrhosis Mother   . Alcohol abuse Mother      Review of Systems Constitutional: negative for weight loss Gastrointestinal: negative for vomiting Genitourinary:negative for genital lesions and vaginal discharge and dysuria Musculoskeletal:negative for back pain Behavioral/Psych: negative for abusive relationship, depression, illegal drug usage and tobacco use    Objective:    BP 121/80   Pulse 62   Wt 165 lb (74.8 kg)   LMP 12/25/2016   BMI 25.84 kg/m  General Appearance:    Alert, cooperative, no distress, appears stated age  Head:    Normocephalic, without obvious abnormality, atraumatic  Eyes:    PERRL, conjunctiva/corneas clear, EOM's intact, fundi    benign,  both eyes  Ears:    Normal TM's and external ear canals, both ears  Nose:   Nares normal, septum midline, mucosa normal, no drainage    or sinus tenderness  Throat:   Lips, mucosa, and tongue normal; teeth and gums normal  Neck:   Supple, symmetrical, trachea midline, no adenopathy;    thyroid:  no enlargement/tenderness/nodules; no carotid   bruit or JVD  Back:     Symmetric, no curvature, ROM normal, no CVA tenderness  Lungs:     Clear to auscultation bilaterally, respirations unlabored  Chest Wall:    No tenderness or deformity   Heart:    Regular rate and rhythm, S1 and S2 normal, no murmur, rub   or gallop  Breast Exam:    No tenderness, masses, or nipple abnormality  Abdomen:     Soft, non-tender, bowel sounds active all four quadrants,    no masses, no organomegaly  Genitalia:    Normal female without lesion, discharge or tenderness  Extremities:   Extremities normal, atraumatic, no cyanosis or edema  Pulses:   2+ and symmetric all extremities  Skin:   Skin color, texture, turgor normal, no rashes or lesions  Lymph nodes:   Cervical, supraclavicular, and axillary nodes normal  Neurologic:   CNII-XII intact, normal strength, sensation and reflexes    throughout  Early [place] for inability to hear fetal heart tones performed.  [place] pregnancy, gestational sac and CRL consistent with early [place].  Yoke sack seen, +FM noted.  +cardiac activity present.     Lab Review Urine pregnancy test Labs reviewed yes Radiologic studies reviewed yes  Assessment:    Pregnancy at [redacted]w[redacted]d weeks   IUP confirmed.    Nausea in early pregnancy: Bonjesta ordered   Plan:    ROI for health department records.    Baby scripts discussed: declined at this time.  Prenatal vitamins.  Counseling provided regarding continued use of seat belts, cessation of alcohol consumption, smoking or use of illicit drugs; infection precautions i.e., influenza/TDAP immunizations, toxoplasmosis,CMV,  parvovirus, listeria and varicella; workplace safety, exercise during pregnancy; routine dental care, safe medications, sexual activity, hot tubs, saunas, pools, travel, caffeine use, fish and methlymercury, potential toxins, hair treatments, varicose veins Weight gain recommendations per IOM guidelines reviewed: underweight/BMI< 18.5--> gain 28 - 40 lbs; normal weight/BMI 18.5 - 24.9--> gain 25 - 35 lbs; overweight/BMI 25 - 29.9--> gain 15 - 25 lbs; obese/BMI >30->gain  11 - 20 lbs Problem list reviewed and updated. FIRST/CF mutation testing/NIPT/QUAD SCREEN/fragile X/Ashkenazi Jewish population testing/Spinal muscular atrophy discussed: ordered. Role of ultrasound in pregnancy discussed; fetal survey: requested. Amniocentesis discussed: not indicated.  No orders of the defined types were placed in this encounter.  Orders Placed This Encounter  Procedures  . Culture, OB Urine  . Hemoglobinopathy evaluation  . Varicella zoster antibody, IgG  . MaterniT21 PLUS Core+SCA    Order Specific Question:   Is the patient insulin dependent?    Answer:   No    Order Specific Question:   Please enter gestational age. This should be expressed as weeks AND days, i.e. 16w 6d. Enter weeks here. Enter days in next question.    Answer:   78    Order Specific Question:   Please enter gestational age. This should be expressed as weeks AND days, i.e. 16w 6d. Enter days here. Enter weeks in previous question.    Answer:   5    Order Specific Question:   How was gestational age calculated?    Answer:   Ultrasound    Order Specific Question:   Please give the date of LMP OR Ultrasound OR Estimated date of delivery.    Answer:   10/01/2017    Order Specific Question:   Number of Fetuses (Type of Pregnancy):    Answer:   1    Order Specific Question:   Indications for performing the test? (please choose all that apply):    Answer:   Routine screening    Order Specific Question:   Other Indications? (Y=Yes, N=No)     Answer:   N    Order Specific Question:   If this is a repeat specimen, please indicate the reason:    Answer:   Not indicated    Order Specific Question:   Please specify the patient's race: (C=White/Caucasion, B=Black, I=Native American, A=Asian, H=Hispanic, O=Other, U=Unknown)    Answer:   B    Order Specific Question:   Donor Egg - indicate if the egg was obtained from in vitro fertilization.    Answer:   N    Order Specific Question:   Age of Egg Donor.    Answer:   72    Order Specific Question:   Prior Down Syndrome/ONTD screening during current pregnancy.    Answer:   N    Order Specific Question:  Prior First Trimester Testing    Answer:   N    Order Specific Question:   Prior Second Trimester Testing    Answer:   N    Order Specific Question:   Family History of Neural Tube Defects    Answer:   N    Order Specific Question:   Prior Pregnancy with Down Syndrome    Answer:   N    Order Specific Question:   Please give the patient's weight (in pounds)    Answer:   165  . Hemoglobin A1c  . Obstetric Panel, Including HIV  . Cystic Fibrosis Mutation 97    Follow up in 4 weeks. 50% of 30 min visit spent on counseling and coordination of care.

## 2017-03-03 NOTE — Addendum Note (Signed)
Addended by: Orvilla CornwallENNEY, Luanne Krzyzanowski A on: 03/03/2017 10:52 AM   Modules accepted: Orders

## 2017-03-03 NOTE — Progress Notes (Signed)
Lower pelvic pain- patient is concerned due to her previous ectopic pregnancy.

## 2017-03-05 LAB — CULTURE, OB URINE

## 2017-03-05 LAB — URINE CULTURE, OB REFLEX

## 2017-03-06 ENCOUNTER — Other Ambulatory Visit: Payer: Self-pay | Admitting: *Deleted

## 2017-03-06 DIAGNOSIS — O219 Vomiting of pregnancy, unspecified: Secondary | ICD-10-CM

## 2017-03-06 MED ORDER — DOXYLAMINE-PYRIDOXINE 10-10 MG PO TBEC: 1.0000 | DELAYED_RELEASE_TABLET | Freq: Three times a day (TID) | ORAL | 99 refills | Status: DC

## 2017-03-06 NOTE — Progress Notes (Signed)
Insurance does not cover [employer] sent

## 2017-03-07 ENCOUNTER — Ambulatory Visit (HOSPITAL_COMMUNITY)
Admission: RE | Admit: 2017-03-07 | Discharge: 2017-03-07 | Disposition: A | Discharge status: Home / Self Care | Payer: Medicaid Other | Source: Ambulatory Visit | Attending: Nurse Practitioner | Admitting: Nurse Practitioner

## 2017-03-07 ENCOUNTER — Other Ambulatory Visit (HOSPITAL_COMMUNITY): Payer: Medicaid Other

## 2017-03-07 LAB — OBSTETRIC PANEL, INCLUDING HIV
Antibody Screen: NEGATIVE
BASOS ABS: 0.1 10*3/uL (ref 0.0–0.2)
Basos: 1 %
EOS (ABSOLUTE): 0.1 10*3/uL (ref 0.0–0.4)
EOS: 1 %
HEMOGLOBIN: 12.3 g/dL (ref 11.1–15.9)
HEP B S AG: NEGATIVE
HIV Screen 4th Generation wRfx: NONREACTIVE
Hematocrit: 37 % (ref 34.0–46.6)
IMMATURE GRANS (ABS): 0 10*3/uL (ref 0.0–0.1)
IMMATURE GRANULOCYTES: 0 %
LYMPHS ABS: 1.7 10*3/uL (ref 0.7–3.1)
Lymphs: 31 %
MCH: 26.9 pg (ref 26.6–33.0)
MCHC: 33.2 g/dL (ref 31.5–35.7)
MCV: 81 fL (ref 79–97)
MONOCYTES: 9 %
Monocytes Absolute: 0.5 10*3/uL (ref 0.1–0.9)
NEUTROS PCT: 58 %
Neutrophils Absolute: 3.3 10*3/uL (ref 1.4–7.0)
Platelets: 276 10*3/uL (ref 150–379)
RBC: 4.58 x10E6/uL (ref 3.77–5.28)
RDW: 14.6 % (ref 12.3–15.4)
RH TYPE: POSITIVE
RPR: NONREACTIVE
RUBELLA: 3.07 {index} (ref >0.99)
WBC: 5.6 10*3/uL (ref 3.4–10.8)

## 2017-03-07 LAB — HEMOGLOBIN A1C
ESTIMATED AVERAGE GLUCOSE: 111 mg/dL
HEMOGLOBIN A1C: 5.5 % (ref 4.8–5.6)

## 2017-03-07 LAB — HEMOGLOBINOPATHY EVALUATION
HEMOGLOBIN F QUANTITATION: 0 % (ref 0.0–2.0)
HGB C: 0 %
HGB S: 0 %
HGB VARIANT: 0 %
Hemoglobin A2 Quantitation: 2.4 % (ref 1.8–3.2)
Hgb A: 97.6 % (ref 96.4–98.8)

## 2017-03-07 LAB — VARICELLA ZOSTER ANTIBODY, IGG

## 2017-03-09 ENCOUNTER — Other Ambulatory Visit: Payer: Self-pay | Admitting: Certified Nurse Midwife

## 2017-03-09 DIAGNOSIS — Z3480 Encounter for supervision of other normal pregnancy, unspecified trimester: Secondary | ICD-10-CM

## 2017-03-09 LAB — MATERNIT21 PLUS CORE+SCA
CHROMOSOME 18: NEGATIVE
Chromosome 13: NEGATIVE
Chromosome 21: NEGATIVE
Y CHROMOSOME: NOT DETECTED

## 2017-03-10 LAB — CYSTIC FIBROSIS MUTATION 97: Interpretation: NOT DETECTED

## 2017-03-13 ENCOUNTER — Encounter: Payer: Self-pay | Admitting: Family Medicine

## 2017-03-13 ENCOUNTER — Other Ambulatory Visit: Payer: Self-pay | Admitting: Certified Nurse Midwife

## 2017-03-13 DIAGNOSIS — Z3480 Encounter for supervision of other normal pregnancy, unspecified trimester: Secondary | ICD-10-CM

## 2017-03-29 ENCOUNTER — Ambulatory Visit (HOSPITAL_COMMUNITY)
Admission: RE | Admit: 2017-03-29 | Discharge: 2017-03-29 | Disposition: A | Discharge status: Home / Self Care | Payer: Medicaid Other | Source: Ambulatory Visit | Attending: Nurse Practitioner | Admitting: Nurse Practitioner

## 2017-03-29 ENCOUNTER — Encounter (HOSPITAL_COMMUNITY): Payer: Self-pay

## 2017-03-29 ENCOUNTER — Inpatient Hospital Stay (HOSPITAL_COMMUNITY): Admission: RE | Admit: 2017-03-29 | Payer: Medicaid Other | Source: Ambulatory Visit

## 2017-03-30 ENCOUNTER — Encounter: Payer: Medicaid Other | Admitting: Certified Nurse Midwife

## 2017-06-19 ENCOUNTER — Encounter: Payer: Medicaid Other | Admitting: Certified Nurse Midwife

## 2017-06-21 ENCOUNTER — Ambulatory Visit (INDEPENDENT_AMBULATORY_CARE_PROVIDER_SITE_OTHER): Payer: Medicaid Other | Admitting: Obstetrics and Gynecology

## 2017-06-21 ENCOUNTER — Encounter: Payer: Self-pay | Admitting: Obstetrics and Gynecology

## 2017-06-21 VITALS — BP 116/77 | HR 99 | Wt 188.0 lb

## 2017-06-21 DIAGNOSIS — F319 Bipolar disorder, unspecified: Secondary | ICD-10-CM | POA: Insufficient documentation

## 2017-06-21 DIAGNOSIS — R87610 Atypical squamous cells of undetermined significance on cytologic smear of cervix (ASC-US): Secondary | ICD-10-CM

## 2017-06-21 DIAGNOSIS — F209 Schizophrenia, unspecified: Secondary | ICD-10-CM | POA: Insufficient documentation

## 2017-06-21 DIAGNOSIS — O09899 Supervision of other high risk pregnancies, unspecified trimester: Secondary | ICD-10-CM

## 2017-06-21 DIAGNOSIS — Z3482 Encounter for supervision of other normal pregnancy, second trimester: Secondary | ICD-10-CM

## 2017-06-21 DIAGNOSIS — Z283 Underimmunization status: Secondary | ICD-10-CM

## 2017-06-21 DIAGNOSIS — Z3480 Encounter for supervision of other normal pregnancy, unspecified trimester: Secondary | ICD-10-CM

## 2017-06-21 DIAGNOSIS — O09892 Supervision of other high risk pregnancies, second trimester: Secondary | ICD-10-CM

## 2017-06-21 NOTE — Patient Instructions (Addendum)
For colds and allergies  Any anti-histamine including benadryl, allegra, claritin, etc.  Sudafed but not phenylephrine  Mucinex  Robitussin  For Reflux/heartburn  Pepcid Zantac Tums Prilosec Prevacid  For yeast infections  Monistat  For constipation  Colace  For minor aches and pains  Tylenol-do not take more than 4000mg  in 24 hours. Therma-care or like heat packs   Contraception Choices Contraception (birth control) is the use of any methods or devices to prevent pregnancy. Below are some methods to help avoid pregnancy. Hormonal methods  Contraceptive implant. This is a thin, plastic tube containing progesterone hormone. It does not contain estrogen hormone. Your health care provider inserts the tube in the inner part of the upper arm. The tube can remain in place for up to 3 years. After 3 years, the implant must be removed. The implant prevents the ovaries from releasing an egg (ovulation), thickens the cervical mucus to prevent sperm from entering the uterus, and thins the lining of the inside of the uterus.  Progesterone-only injections. These injections are given every 3 months by your health care provider to prevent pregnancy. This synthetic progesterone hormone stops the ovaries from releasing eggs. It also thickens cervical mucus and changes the uterine lining. This makes it harder for sperm to survive in the uterus.  Birth control pills. These pills contain estrogen and progesterone hormone. They work by preventing the ovaries from releasing eggs (ovulation). They also cause the cervical mucus to thicken, preventing the sperm from entering the uterus. Birth control pills are prescribed by a health care provider.Birth control pills can also be used to treat heavy periods.  Minipill. This type of birth control pill contains only the progesterone hormone. They are taken every day of each month and must be prescribed by your health care provider.  Birth control patch.  The patch contains hormones similar to those in birth control pills. It must be changed once a week and is prescribed by a health care provider.  Vaginal ring. The ring contains hormones similar to those in birth control pills. It is left in the vagina for 3 weeks, removed for 1 week, and then a new one is put back in place. The patient must be comfortable inserting and removing the ring from the vagina.A health care provider's prescription is necessary.  Emergency contraception. Emergency contraceptives prevent pregnancy after unprotected sexual intercourse. This pill can be taken right after sex or up to 5 days after unprotected sex. It is most effective the sooner you take the pills after having sexual intercourse. Most emergency contraceptive pills are available without a prescription. Check with your pharmacist. Do not use emergency contraception as your only form of birth control. Barrier methods  Female condom. This is a thin sheath (latex or rubber) that is worn over the penis during sexual intercourse. It can be used with spermicide to increase effectiveness.  Female condom. This is a soft, loose-fitting sheath that is put into the vagina before sexual intercourse.  Diaphragm. This is a soft, latex, dome-shaped barrier that must be fitted by a health care provider. It is inserted into the vagina, along with a spermicidal jelly. It is inserted before intercourse. The diaphragm should be left in the vagina for 6 to 8 hours after intercourse.  Cervical cap. This is a round, soft, latex or plastic cup that fits over the cervix and must be fitted by a health care provider. The cap can be left in place for up to 48 hours after intercourse.  Sponge. This is a soft, circular piece of polyurethane foam. The sponge has spermicide in it. It is inserted into the vagina after wetting it and before sexual intercourse.  Spermicides. These are chemicals that kill or block sperm from entering the cervix and  uterus. They come in the form of creams, jellies, suppositories, foam, or tablets. They do not require a prescription. They are inserted into the vagina with an applicator before having sexual intercourse. The process must be repeated every time you have sexual intercourse. Intrauterine contraception  Intrauterine device (IUD). This is a T-shaped device that is put in a woman's uterus during a menstrual period to prevent pregnancy. There are 2 types: ? Copper IUD. This type of IUD is wrapped in copper wire and is placed inside the uterus. Copper makes the uterus and fallopian tubes produce a fluid that kills sperm. It can stay in place for 10 years. ? Hormone IUD. This type of IUD contains the hormone progestin (synthetic progesterone). The hormone thickens the cervical mucus and prevents sperm from entering the uterus, and it also thins the uterine lining to prevent implantation of a fertilized egg. The hormone can weaken or kill the sperm that get into the uterus. It can stay in place for 3-5 years, depending on which type of IUD is used. Permanent methods of contraception  Female tubal ligation. This is when the woman's fallopian tubes are surgically sealed, tied, or blocked to prevent the egg from traveling to the uterus.  Hysteroscopic sterilization. This involves placing a small coil or insert into each fallopian tube. Your doctor uses a technique called hysteroscopy to do the procedure. The device causes scar tissue to form. This results in permanent blockage of the fallopian tubes, so the sperm cannot fertilize the egg. It takes about 3 months after the procedure for the tubes to become blocked. You must use another form of birth control for these 3 months.  Female sterilization. This is when the female has the tubes that carry sperm tied off (vasectomy).This blocks sperm from entering the vagina during sexual intercourse. After the procedure, the man can still ejaculate fluid (semen). Natural  planning methods  Natural family planning. This is not having sexual intercourse or using a barrier method (condom, diaphragm, cervical cap) on days the woman could become pregnant.  Calendar method. This is keeping track of the length of each menstrual cycle and identifying when you are fertile.  Ovulation method. This is avoiding sexual intercourse during ovulation.  Symptothermal method. This is avoiding sexual intercourse during ovulation, using a thermometer and ovulation symptoms.  Post-ovulation method. This is timing sexual intercourse after you have ovulated. Regardless of which type or method of contraception you choose, it is important that you use condoms to protect against the transmission of sexually transmitted infections (STIs). Talk with your health care provider about which form of contraception is most appropriate for you. This information is not intended to replace advice given to you by your health care provider. Make sure you discuss any questions you have with your health care provider. Document Released: 07/11/2005 Document Revised: 12/17/2015 Document Reviewed: 01/03/2013 Elsevier Interactive Patient Education  2017 [employer]

## 2017-06-21 NOTE — Progress Notes (Signed)
   PRENATAL VISIT NOTE  Subjective:  Sandra Anderson is a 26 y.o. G5P3013 at 4842w3d being seen today for ongoing prenatal care.  She is currently monitored for the following issues for this low-risk pregnancy and has Supervision of other normal pregnancy, antepartum; History of ectopic pregnancy; History of prior pregnancy with IUGR newborn; Schizophrenia (HCC); Maternal varicella, non-immune; Bipolar affective (HCC); and ASCUS of cervix with negative high risk HPV on their problem list.  Patient reports congestion and cold today, not sure of what she could take.  Contractions: Not present. Vag. Bleeding: None.  Movement: Present. Denies leaking of fluid.   The following portions of the patient's history were reviewed and updated as appropriate: allergies, current medications, past family history, past medical history, past social history, past surgical history and problem list. Problem list updated.  Objective:   Vitals:   06/21/17 1331  BP: 116/77  Pulse: 99  Weight: 188 lb (85.3 kg)    Fetal Status: Fetal Heart Rate (bpm): 142 Fundal Height: 25 cm Movement: Present     General:  Alert, oriented and cooperative. Patient is in no acute distress. Appears fatigued with minor cough today  Skin: Skin is warm and dry. No rash noted.   Cardiovascular: Normal heart rate noted  Respiratory: Normal respiratory effort, no problems with respiration noted  Abdomen: Soft, gravid, appropriate for gestational age.  Pain/Pressure: Absent     Pelvic: Cervical exam deferred        Extremities: Normal range of motion.  Edema: None  Mental Status:  Normal mood and affect. Normal behavior. Normal judgment and thought content.   Assessment and Plan:  Pregnancy: [id number] at 8242w3d  1. Supervision of other normal pregnancy, antepartum Reviewed options for birth control including oral contraceptive pills (combination and progesterone only), NuvaRing, Depo-Provera, Nexplanon, IUDs (copper and levonorgestrol).    Anatomy scan scheduled today To meet with SW regarding support options  2. Schizophrenia, unspecified type (HCC) No meds [facility]  3. Maternal varicella, non-immune Needs Varicella pp  4. Bipolar affective disorder, remission status unspecified (HCC) No meds, stable To follow up with [facility]  5. ASCUS of cervix with negative high risk HPV F/u 3 years Reviewed importance of following up    Preterm labor symptoms and general obstetric precautions including but not limited to vaginal bleeding, contractions, leaking of fluid and fetal movement were reviewed in detail with the patient. Please refer to After Visit Summary for other counseling recommendations.  Return in about 2 weeks (around 07/05/2017) for OB visit.   Conan BowensKelly M Vivianne Carles, MD

## 2017-06-21 NOTE — Progress Notes (Signed)
Pt has a cold has not tried anything OTC to help with cold.  Pt would like a Rx to help with cough. Pt states she was in prison x 3 months. Request for some type of assistance for her and her baby because she has no support team at home.

## 2017-06-27 ENCOUNTER — Other Ambulatory Visit: Payer: Self-pay | Admitting: Obstetrics and Gynecology

## 2017-06-27 ENCOUNTER — Ambulatory Visit (HOSPITAL_COMMUNITY)
Admission: RE | Admit: 2017-06-27 | Discharge: 2017-06-27 | Disposition: A | Discharge status: Home / Self Care | Payer: Medicaid Other | Source: Ambulatory Visit | Attending: Obstetrics and Gynecology | Admitting: Obstetrics and Gynecology

## 2017-06-27 ENCOUNTER — Encounter (HOSPITAL_COMMUNITY): Payer: Self-pay

## 2017-06-27 DIAGNOSIS — Z3689 Encounter for other specified antenatal screening: Secondary | ICD-10-CM | POA: Diagnosis not present

## 2017-06-27 DIAGNOSIS — O09899 Supervision of other high risk pregnancies, unspecified trimester: Secondary | ICD-10-CM

## 2017-06-27 DIAGNOSIS — O09299 Supervision of pregnancy with other poor reproductive or obstetric history, unspecified trimester: Secondary | ICD-10-CM

## 2017-06-27 DIAGNOSIS — O0932 Supervision of pregnancy with insufficient antenatal care, second trimester: Secondary | ICD-10-CM | POA: Insufficient documentation

## 2017-06-27 DIAGNOSIS — R87610 Atypical squamous cells of undetermined significance on cytologic smear of cervix (ASC-US): Secondary | ICD-10-CM

## 2017-06-27 DIAGNOSIS — Z3480 Encounter for supervision of other normal pregnancy, unspecified trimester: Secondary | ICD-10-CM

## 2017-06-27 DIAGNOSIS — Z3A26 26 weeks gestation of pregnancy: Secondary | ICD-10-CM | POA: Insufficient documentation

## 2017-06-27 DIAGNOSIS — Z283 Underimmunization status: Secondary | ICD-10-CM

## 2017-06-27 DIAGNOSIS — F319 Bipolar disorder, unspecified: Secondary | ICD-10-CM

## 2017-07-04 ENCOUNTER — Other Ambulatory Visit: Payer: Self-pay | Admitting: Obstetrics and Gynecology

## 2017-07-04 DIAGNOSIS — O09299 Supervision of pregnancy with other poor reproductive or obstetric history, unspecified trimester: Secondary | ICD-10-CM

## 2017-07-04 DIAGNOSIS — O0932 Supervision of pregnancy with insufficient antenatal care, second trimester: Secondary | ICD-10-CM

## 2017-07-04 DIAGNOSIS — Z3689 Encounter for other specified antenatal screening: Secondary | ICD-10-CM

## 2017-07-04 DIAGNOSIS — Z3A26 26 weeks gestation of pregnancy: Secondary | ICD-10-CM

## 2017-07-06 ENCOUNTER — Encounter: Payer: Self-pay | Admitting: Medical

## 2017-07-06 ENCOUNTER — Ambulatory Visit (INDEPENDENT_AMBULATORY_CARE_PROVIDER_SITE_OTHER): Payer: Medicaid Other | Admitting: Medical

## 2017-07-06 ENCOUNTER — Other Ambulatory Visit: Payer: Self-pay

## 2017-07-06 DIAGNOSIS — Z3480 Encounter for supervision of other normal pregnancy, unspecified trimester: Secondary | ICD-10-CM

## 2017-07-06 DIAGNOSIS — Z3483 Encounter for supervision of other normal pregnancy, third trimester: Secondary | ICD-10-CM

## 2017-07-06 NOTE — Progress Notes (Signed)
   PRENATAL VISIT NOTE  Subjective:  Sandra Anderson is a 26 y.o. G5P3013 at 2447w4d being seen today for ongoing prenatal care.  She is currently monitored for the following issues for this high-risk pregnancy and has Supervision of other normal pregnancy, antepartum; History of ectopic pregnancy; History of prior pregnancy with IUGR newborn; Schizophrenia (HCC); Maternal varicella, non-immune; Bipolar affective (HCC); and ASCUS of cervix with negative high risk HPV on their problem list.  Patient reports no complaints.  Contractions: Not present. Vag. Bleeding: None.  Movement: Present. Denies leaking of fluid.   The following portions of the patient's history were reviewed and updated as appropriate: allergies, current medications, past family history, past medical history, past social history, past surgical history and problem list. Problem list updated.  Objective:   Vitals:   07/06/17 1058  BP: 109/68  Pulse: 89  Weight: 191 lb 11.2 oz (87 kg)    Fetal Status: Fetal Heart Rate (bpm): 146 Fundal Height: 27 cm Movement: Present     General:  Alert, oriented and cooperative. Patient is in no acute distress.  Skin: Skin is warm and dry. No rash noted.   Cardiovascular: Normal heart rate noted  Respiratory: Normal respiratory effort, no problems with respiration noted  Abdomen: Soft, gravid, appropriate for gestational age.  Pain/Pressure: Absent     Pelvic: Cervical exam deferred        Extremities: Normal range of motion.  Edema: None  Mental Status:  Normal mood and affect. Normal behavior. Normal judgment and thought content.   Assessment and Plan:  Pregnancy: [id number] at 9747w4d  1. Supervision of other normal pregnancy, antepartum - Doing well, no complaints  - Unable to stay for 2 hour GTT today, will return tomorrow for fasting labs only - Declined TDaP  Preterm labor symptoms and general obstetric precautions including but not limited to vaginal bleeding, contractions,  leaking of fluid and fetal movement were reviewed in detail with the patient. Please refer to After Visit Summary for other counseling recommendations.  Return in about 2 weeks (around 07/20/2017) for LOB.   Vonzella NippleJulie Kayn Haymore, PA-C

## 2017-07-06 NOTE — Patient Instructions (Signed)
Fetal Movement Counts °Patient Name: ________________________________________________ Patient Due Date: ____________________ °What is a fetal movement count? °A fetal movement count is the number of times that you feel your baby move during a certain amount of time. This may also be called a fetal kick count. A fetal movement count is recommended for every pregnant woman. You may be asked to start counting fetal movements as early as week 28 of your pregnancy. °Pay attention to when your baby is most active. You may notice your baby's sleep and wake cycles. You may also notice things that make your baby move more. You should do a fetal movement count: °· When your baby is normally most active. °· At the same time each day. ° °A good time to count movements is while you are resting, after having something to eat and drink. °How do I count fetal movements? °1. Find a quiet, comfortable area. Sit, or lie down on your side. °2. Write down the date, the start time and stop time, and the number of movements that you felt between those two times. Take this information with you to your health care visits. °3. For 2 hours, count kicks, flutters, swishes, rolls, and jabs. You should feel at least 10 movements during 2 hours. °4. You may stop counting after you have felt 10 movements. °5. If you do not feel 10 movements in 2 hours, have something to eat and drink. Then, keep resting and counting for 1 hour. If you feel at least 4 movements during that hour, you may stop counting. °Contact a health care provider if: °· You feel fewer than 4 movements in 2 hours. °· Your baby is not moving like he or she usually does. °Date: ____________ Start time: ____________ Stop time: ____________ Movements: ____________ °Date: ____________ Start time: ____________ Stop time: ____________ Movements: ____________ °Date: ____________ Start time: ____________ Stop time: ____________ Movements: ____________ °Date: ____________ Start time:  ____________ Stop time: ____________ Movements: ____________ °Date: ____________ Start time: ____________ Stop time: ____________ Movements: ____________ °Date: ____________ Start time: ____________ Stop time: ____________ Movements: ____________ °Date: ____________ Start time: ____________ Stop time: ____________ Movements: ____________ °Date: ____________ Start time: ____________ Stop time: ____________ Movements: ____________ °Date: ____________ Start time: ____________ Stop time: ____________ Movements: ____________ °This information is not intended to replace advice given to you by your health care provider. Make sure you discuss any questions you have with your health care provider. °Document Released: 08/10/2006 Document Revised: 03/09/2016 Document Reviewed: 08/20/2015 °Elsevier Interactive Patient Education © 2018 Elsevier Inc. °Braxton Hicks Contractions °Contractions of the uterus can occur throughout pregnancy, but they are not always a sign that you are in labor. You may have practice contractions called Braxton Hicks contractions. These false labor contractions are sometimes confused with true labor. °What are Braxton Hicks contractions? °Braxton Hicks contractions are tightening movements that occur in the muscles of the uterus before labor. Unlike true labor contractions, these contractions do not result in opening (dilation) and thinning of the cervix. Toward the end of pregnancy (32-34 weeks), Braxton Hicks contractions can happen more often and may become stronger. These contractions are sometimes difficult to tell apart from true labor because they can be very uncomfortable. You should not feel embarrassed if you go to the hospital with false labor. °Sometimes, the only way to tell if you are in true labor is for your health care provider to look for changes in the cervix. The health care provider will do a physical exam and may monitor your contractions. If   you are not in true labor, the exam  should show that your cervix is not dilating and your water has not broken. °If there are no prenatal problems or other health problems associated with your pregnancy, it is completely safe for you to be sent home with false labor. You may continue to have Braxton Hicks contractions until you go into true labor. °How can I tell the difference between true labor and false labor? °· Differences °? False labor °? Contractions last 30-70 seconds.: Contractions are usually shorter and not as strong as true labor contractions. °? Contractions become very regular.: Contractions are usually irregular. °? Discomfort is usually felt in the top of the uterus, and it spreads to the lower abdomen and low back.: Contractions are often felt in the front of the lower abdomen and in the groin. °? Contractions do not go away with walking.: Contractions may go away when you walk around or change positions while lying down. °? Contractions usually become more intense and increase in frequency.: Contractions get weaker and are shorter-lasting as time goes on. °? The cervix dilates and gets thinner.: The cervix usually does not dilate or become thin. °Follow these instructions at home: °· Take over-the-counter and prescription medicines only as told by your health care provider. °· Keep up with your usual exercises and follow other instructions from your health care provider. °· Eat and drink lightly if you think you are going into labor. °· If Braxton Hicks contractions are making you uncomfortable: °? Change your position from lying down or resting to walking, or change from walking to resting. °? Sit and rest in a tub of warm water. °? Drink enough fluid to keep your urine clear or pale yellow. Dehydration may cause these contractions. °? Do slow and deep breathing several times an hour. °· Keep all follow-up prenatal visits as told by your health care provider. This is important. °Contact a health care provider if: °· You have a  fever. °· You have continuous pain in your abdomen. °Get help right away if: °· Your contractions become stronger, more regular, and closer together. °· You have fluid leaking or gushing from your vagina. °· You pass blood-tinged mucus (bloody show). °· You have bleeding from your vagina. °· You have low back pain that you never had before. °· You feel your baby’s head pushing down and causing pelvic pressure. °· Your baby is not moving inside you as much as it used to. °Summary °· Contractions that occur before labor are called Braxton Hicks contractions, false labor, or practice contractions. °· Braxton Hicks contractions are usually shorter, weaker, farther apart, and less regular than true labor contractions. True labor contractions usually become progressively stronger and regular and they become more frequent. °· Manage discomfort from Braxton Hicks contractions by changing position, resting in a warm bath, drinking plenty of water, or practicing deep breathing. °This information is not intended to replace advice given to you by your health care provider. Make sure you discuss any questions you have with your health care provider. °Document Released: 07/11/2005 Document Revised: 05/30/2016 Document Reviewed: 05/30/2016 °Elsevier Interactive Patient Education © 2017 Elsevier Inc. ° °

## 2017-07-06 NOTE — Progress Notes (Signed)
TDAP declined

## 2017-07-07 ENCOUNTER — Other Ambulatory Visit: Payer: Medicaid Other

## 2017-07-07 DIAGNOSIS — Z3480 Encounter for supervision of other normal pregnancy, unspecified trimester: Secondary | ICD-10-CM

## 2017-07-07 NOTE — Progress Notes (Signed)
OB 2 HR GTT.

## 2017-07-08 LAB — CBC
HEMATOCRIT: 35.2 % (ref 34.0–46.6)
HEMOGLOBIN: 11.9 g/dL (ref 11.1–15.9)
MCH: 28 pg (ref 26.6–33.0)
MCHC: 33.8 g/dL (ref 31.5–35.7)
MCV: 83 fL (ref 79–97)
Platelets: 265 10*3/uL (ref 150–379)
RBC: 4.25 x10E6/uL (ref 3.77–5.28)
RDW: 14.5 % (ref 12.3–15.4)
WBC: 10 10*3/uL (ref 3.4–10.8)

## 2017-07-08 LAB — GLUCOSE TOLERANCE, 2 HOURS W/ 1HR
Glucose, 1 hour: 117 mg/dL (ref 65–179)
Glucose, 2 hour: 123 mg/dL (ref 65–152)
Glucose, Fasting: 94 mg/dL — ABNORMAL HIGH (ref 65–91)

## 2017-07-08 LAB — HIV ANTIBODY (ROUTINE TESTING W REFLEX): HIV Screen 4th Generation wRfx: NONREACTIVE

## 2017-07-08 LAB — RPR: RPR: NONREACTIVE

## 2017-07-20 ENCOUNTER — Telehealth: Payer: Self-pay | Admitting: Pediatrics

## 2017-07-20 DIAGNOSIS — O24419 Gestational diabetes mellitus in pregnancy, unspecified control: Secondary | ICD-10-CM

## 2017-07-20 MED ORDER — GLUCOSE BLOOD VI STRP: ORAL_STRIP | 12 refills | Status: DC

## 2017-07-20 MED ORDER — ACCU-CHEK GUIDE W/DEVICE KIT: 1.0000 | PACK | 0 refills | Status: DC

## 2017-07-20 MED ORDER — ACCU-CHEK MULTICLIX LANCETS MISC: 12 refills | Status: DC

## 2017-07-20 NOTE — Telephone Encounter (Signed)
-----   Message from Currie Pariserri L Burleson, NP sent at 07/13/2017 11:53 AM EST ----- One abnormal value on 2 hr GTT - schedule with diabetes educator/nutritionist to address gestational diabetes

## 2017-07-21 ENCOUNTER — Ambulatory Visit (INDEPENDENT_AMBULATORY_CARE_PROVIDER_SITE_OTHER): Payer: Medicaid Other | Admitting: Obstetrics

## 2017-07-21 ENCOUNTER — Encounter: Payer: Self-pay | Admitting: Obstetrics

## 2017-07-21 VITALS — BP 126/81 | HR 98 | Wt 192.6 lb

## 2017-07-21 DIAGNOSIS — F319 Bipolar disorder, unspecified: Secondary | ICD-10-CM

## 2017-07-21 DIAGNOSIS — Z3482 Encounter for supervision of other normal pregnancy, second trimester: Secondary | ICD-10-CM

## 2017-07-21 DIAGNOSIS — Z3480 Encounter for supervision of other normal pregnancy, unspecified trimester: Secondary | ICD-10-CM

## 2017-07-21 DIAGNOSIS — O24410 Gestational diabetes mellitus in pregnancy, diet controlled: Secondary | ICD-10-CM

## 2017-07-21 DIAGNOSIS — Z8759 Personal history of other complications of pregnancy, childbirth and the puerperium: Secondary | ICD-10-CM

## 2017-07-21 MED ORDER — ACCU-CHEK MULTICLIX LANCETS MISC: 12 refills | Status: DC

## 2017-07-21 MED ORDER — ACCU-CHEK GUIDE W/DEVICE KIT: 1.0000 | PACK | 0 refills | Status: DC

## 2017-07-21 MED ORDER — GLUCOSE BLOOD VI STRP: ORAL_STRIP | 12 refills | Status: DC

## 2017-07-21 NOTE — Progress Notes (Signed)
Patient reports good fetal movement, denies pain. 

## 2017-07-21 NOTE — Progress Notes (Signed)
Subjective:  Sandra Anderson is a 26 y.o. G5P3013 at 32w5dbeing seen today for ongoing prenatal care.  She is currently monitored for the following issues for this high-risk pregnancy and has Supervision of other normal pregnancy, antepartum; History of ectopic pregnancy; History of prior pregnancy with IUGR newborn; Schizophrenia ([facility]; Maternal varicella, non-immune; Bipolar affective ([place]; ASCUS of cervix with negative high risk HPV; and Gestational diabetes on their problem list.  Patient reports no complaints.  Contractions: Not present. Vag. Bleeding: None.  Movement: Present. Denies leaking of fluid.   The following portions of the patient's history were reviewed and updated as appropriate: allergies, current medications, past family history, past medical history, past social history, past surgical history and problem list. Problem list updated.  Objective:   Vitals:   07/21/17 1010  BP: 126/81  Pulse: 98  Weight: 192 lb 9.6 oz (87.4 kg)    Fetal Status: Fetal Heart Rate (bpm): 140   Movement: Present     General:  Alert, oriented and cooperative. Patient is in no acute distress.  Skin: Skin is warm and dry. No rash noted.   Cardiovascular: Normal heart rate noted  Respiratory: Normal respiratory effort, no problems with respiration noted  Abdomen: Soft, gravid, appropriate for gestational age. Pain/Pressure: Absent     Pelvic:  Cervical exam deferred        Extremities: Normal range of motion.  Edema: Trace  Mental Status: Normal mood and affect. Normal behavior. Normal judgment and thought content.   Urinalysis:      Assessment and Plan:  Pregnancy: [id number] 230w5d1. Supervision of other normal pregnancy, antepartum   2. Diet controlled White classification A1 gestational diabetes mellitus (GDM) Rx: - Referral to Nutrition and Diabetes Services - [place] FO[place]; Future - glucose blood (ACCU-CHEK GUIDE) test strip; Use as instructed  Dispense: 100 each; Refill:  12 - Lancets (ACCU-CHEK MULTICLIX) lancets; Use as instructed  Dispense: 100 each; Refill: 12 - Blood Glucose Monitoring Suppl (ACCU-CHEK GUIDE) w/Device KIT; 1 each by Does not apply route as directed.  Dispense: 1 kit; Refill: 0  3. History of prior pregnancy with IUGR newborn - serial growth scans, monthly  4. Bipolar affective disorder, remission status unspecified ([place]- stable, no meds  Preterm labor symptoms and general obstetric precautions including but not limited to vaginal bleeding, contractions, leaking of fluid and fetal movement were reviewed in detail with the patient. Please refer to After Visit Summary for other counseling recommendations.  Return in about 2 weeks (around 08/04/2017) for ROB.   HaShelly BombardMD

## 2017-07-21 NOTE — Patient Instructions (Addendum)
Gestational Diabetes Mellitus, Diagnosis Gestational diabetes (gestational diabetes mellitus) is a temporary form of diabetes that some women develop during pregnancy. It usually occurs around weeks 24-28 of pregnancy and goes away after delivery. Hormonal changes during pregnancy can interfere with insulin production and function, which may result in one or both of these problems:  The pancreas does not make enough of a hormone called insulin.  Cells in the body do not respond properly to insulin that the body makes (insulin resistance).  Normally, insulin allows sugars (glucose) to enter cells in the body. The cells use glucose for energy. Insulin resistance or lack of insulin causes excess glucose to build up in the blood instead of going into cells. As a result, high blood glucose (hyperglycemia) develops. What are the risks? If gestational diabetes is treated, it is unlikely to cause problems. If it is not controlled with treatment, it may cause problems during labor and delivery, and some of those problems can be harmful to the unborn baby (fetus) and the mother. Uncontrolled gestational diabetes may also cause the newborn baby to have breathing problems and low blood glucose. Women who get gestational diabetes are more likely to develop it if they get pregnant again, and they are more likely to develop type 2 diabetes in the future. What increases the risk? This condition may be more likely to develop in pregnant women who:  Are older than age 2 during pregnancy.  Have a family history of diabetes.  Are overweight.  Had gestational diabetes in the past.  Have polycystic ovarian syndrome (PCOS).  Are pregnant with twins or multiples.  Are of American-Indian, African-American, Hispanic/Latino, or Asian/Pacific Islander descent.  What are the signs or symptoms? Most women do not notice symptoms of gestational diabetes because the symptoms are similar to normal symptoms of pregnancy.  Symptoms of gestational diabetes may include:  Increased thirst (polydipsia).  Increased hunger(polyphagia).  Increased urination (polyuria).  How is this diagnosed?  This condition may be diagnosed based on your blood glucose level, which may be checked with one or more of the following blood tests:  A fasting blood glucose (FBG) test. You will not be allowed to eat (you will fast) for at least 8 hours before a blood sample is taken.  A random blood glucose test. This checks your blood glucose at any time of day regardless of when you ate.  An oral glucose tolerance test (OGTT). This is usually done during weeks 24-28 of pregnancy. ? For this test, you will have an FBG test done. Then, you will drink a beverage that contains glucose. Your blood glucose will be tested again 1 hour after drinking the glucose beverage (1-hour OGTT). ? If the 1-hour OGTT result is at or above 140 mg/dL (7.8 mmol/L), you will repeat the OGTT. This time, your blood glucose will be tested 3 hours after drinking the glucose beverage (3-hour OGTT).  If you have risk factors, you may be screened for undiagnosed type 2 diabetes at your first health care visit during your pregnancy (prenatal visit). How is this treated?  Your treatment may be managed by a specialist called an endocrinologist. This condition is treated by following instructions from your health care provider about:  Eating a healthier diet and getting more physical activity. These changes are the most important ways to manage gestational diabetes.  Checking your blood glucose. Do this as often as told.  Taking diabetes medicines or insulin every day. These will only be prescribed if they are  needed. ? If you use insulin, you may need to adjust your dosage based on how physically active you are and what foods you eat. Your health care provider will tell you how to do this.  Your health care provider will set treatment goals for you based on the  stage of your pregnancy and any other medical conditions you have. Generally, the goal of treatment is to maintain the following blood glucose levels during pregnancy:  Fasting: at or below 95 mg/dL (5.3 mmol/L).  After meals (postprandial): ? One hour after a meal: at or below 140 mg/dL (7.8 mmol/L). ? Two hours after a meal: at or below 120 mg/dL (6.7 mmol/L).  A1c (hemoglobin A1c) level: 6-6.5%.  Follow these instructions at home:  Take over-the-counter and prescription medicines only as told by your health care provider.  Manage your weight gain during pregnancy. The amount of weight that you are expected to gain depends on your pre-pregnancy BMI (body mass index).  Keep all follow-up visits as told by your health care provider. This is important. Consider asking your health care provider these questions:   Do I need to meet with a diabetes educator?  Where can I find a support group for people with diabetes?  What equipment will I need to manage my diabetes at home?  What diabetes medicines do I need, and when should I take them?  How often do I need to check my blood glucose?  What number can I call if I have questions?  When is my next appointment? Where to find more information:  For more information about diabetes, visit: ? American Diabetes Association (ADA): www.diabetes.org ? American Association of Diabetes Educators (AADE): www.diabeteseducator.org/patient-resources Contact a health care provider if:  Your blood glucose level is at or above 240 mg/dL (13.3 mmol/L).  Your blood glucose level is at or above 200 mg/dL (11.1 mmol/L) and you have ketones in your urine.  You have been sick or have had a fever for 2 days or more and you are not getting better.  You have any of the following problems for more than 6 hours: ? You cannot eat or drink. ? You have nausea and vomiting. ? You have diarrhea. Get help right away if:  Your blood glucose is below 54  mg/dL (3 mmol/L).  You become confused or you have trouble thinking clearly.  You have difficulty breathing.  You have moderate or large ketone levels in your urine.  Your baby is moving around less than usual.  You develop unusual discharge or bleeding from your vagina.  You start having contractions early (prematurely). Contractions may feel like a tightening in your lower abdomen. This information is not intended to replace advice given to you by your health care provider. Make sure you discuss any questions you have with your health care provider. Document Released: 10/17/2000 Document Revised: 12/17/2015 Document Reviewed: 08/14/2015 Elsevier Interactive Patient Education  2018 [employer].  Gestational Diabetes Mellitus, Self Care Caring for yourself after you have been diagnosed with gestational diabetes (gestational diabetes mellitus) means keeping your blood sugar (glucose) under control with a balance of:  Nutrition.  Exercise.  Lifestyle changes.  Medicines or insulin, if necessary.  Support from your team of health care providers and others.  The following information explains what you need to know to manage your gestational diabetes at home. What do I need to do to manage my blood glucose?  Check your blood glucose every day during your pregnancy. Do this  as often as told by your health care provider.  Contact your health care provider if your blood glucose is above your target for 2 tests in a row. Your health care provider will set individualized treatment goals for you. Generally, the goal of treatment is to maintain the following blood glucose levels during pregnancy:  After not eating for 8 hours (after fasting): at or below 95 mg/dL (5.3 mmol/L).  After meals (postprandial): ? One hour after a meal: at or below 140 mg/dL (7.8 mmol/L). ? Two hours after a meal: at or below 120 mg/dL (6.7 mmol/L).  A1c (hemoglobin A1c) level: 6-6.5%.  What do I need to  know about hyperglycemia and hypoglycemia? What is hyperglycemia? Hyperglycemia, also called high blood glucose, occurs when blood glucose is too high. Make sure you know the early signs of hyperglycemia, such as:  Increased thirst.  Hunger.  Feeling very tired.  Needing to urinate more often than usual.  Blurry vision.  What is hypoglycemia? Hypoglycemia, also called low blood glucose, occurswith a blood glucose level at or below 70 mg/dL (3.9 mmol/L). The risk for hypoglycemia increases during or after exercise, during sleep, during illness, and when skipping meals or not eating for a long time (fasting). It is important to know the symptoms of hypoglycemia and treat it right away. Always have a 15-gram rapid-acting carbohydrate snack with you to treat low blood glucose.Family members and close friends should also know the symptoms and should understand how to treat hypoglycemia, in case you are not able to treat yourself. What are the symptoms of hypoglycemia? Hypoglycemia symptoms can include:  Hunger.  Anxiety.  Sweating and feeling clammy.  Confusion.  Dizziness or feeling light-headed.  Sleepiness.  Nausea.  Increased heart rate.  Headache.  Blurry vision.  Seizure.  Nightmares.  Tingling or numbness around the mouth, lips, or tongue.  A change in speech.  Decreased ability to concentrate.  A change in coordination.  Restless sleep.  Tremors or shakes.  Fainting.  Irritability.  How do I treat hypoglycemia?  If you are alert and able to swallow safely, follow the 15:15 rule:  Take 15 grams of a rapid-acting carbohydrate. Rapid-acting options include: ? 1 tube of glucose gel. ? 3 glucose pills. ? 6-8 pieces of hard candy. ? 4 oz (120 mL) of fruit juice. ? 4 oz (120 mL) of regular (not diet) soda.  Check your blood glucose 15 minutes after you take the carbohydrate.  If the repeat blood glucose level is still at or below 70 mg/dL (3.9  mmol/L), take 15 grams of a carbohydrate again.  If your blood glucose level does not increase above 70 mg/dL (3.9 mmol/L) after 3 tries, seek emergency medical care.  After your blood glucose level returns to normal, eat a meal or a snack within 1 hour.  How do I treat severe hypoglycemia? Severe hypoglycemia is when your blood glucose level is at or below 54 mg/dL (3 mmol/L). Severe hypoglycemia is an emergency. Do not wait to see if the symptoms will go away. Get medical help right away. Call your local emergency services (911 in the [place] Do not drive yourself to the hospital. If you have severe hypoglycemia and you cannot eat or drink, you may need an injection of glucagon. A family member or close friend should learn how to check your blood glucose and how to give you a glucagon injection. Ask your health care provider if you need to have an emergency glucagon injection kit  available. Severe hypoglycemia may need to be treated in a hospital. The treatment may include getting glucose through an IV tube. You may also need treatment for the cause of your hypoglycemia. What else can I do to manage my gestational diabetes? Take your diabetes medicines as told  If your health care provider prescribed insulin or diabetes medicines, take them every day.  Do not run out of insulin or other diabetes medicines that you take. Plan ahead so you always have these available.  If you use insulin, adjust your dosage based on how physically active you are and what foods you eat. Your health care provider will tell you how to adjust your dosage. Make healthy food choices  The things that you eat and drink affect your blood glucose. Making good choices helps to control your diabetes and prevent other health problems. A healthy meal plan includes eating lean proteins, complex carbohydrates, fresh fruits and vegetables, low-fat dairy products, and healthy fats. Make an appointment to see a diet and nutrition  specialist (registered dietitian) to help you create an eating plan that is right for you. Make sure that you:  Follow instructions from your health care provider about eating or drinking restrictions.  Drink enough fluid to keep your urine clear or pale yellow.  Eat healthy snacks between nutritious meals.  Track the carbohydrates that you eat. Do this by reading food labels and learning the standard serving sizes of foods.  Follow your sick day plan whenever you cannot eat or drink as usual. Make this plan in advance with your health care provider.  Stay active   Do at least 30 minutes of physical activity a day, or as much physical activity as your health care provider recommends during your pregnancy. ? Doing 10 minutes of exercise 30 minutes after each meal may help to control postprandial blood glucose levels.  If you start a new exercise or activity, work with your health care provider to adjust your insulin, medicines, or food intake as needed. Make healthy lifestyle choices  Do not drink alcohol.  Do not use any tobacco products, such as cigarettes, chewing tobacco, and e-cigarettes. If you need help quitting, ask your health care provider.  Learn to manage stress. If you need help with this, ask your health care provider. Care for your body  Keep your immunizations up to date.  Brush your teeth and gums two times a day, and floss at least one time a day.  Visit your dentist at least once every 6 months.  Maintain a healthy weight during your pregnancy. General instructions   Take over-the-counter and prescription medicines only as told by your health care provider.  Talk with your health care provider about your risk for high blood pressure during pregnancy (preeclampsia or eclampsia).  Share your diabetes management plan with people in your workplace, school, and household.  Check your urine for ketones during your pregnancy when you are ill and as told by your  health care provider.  Carry a medical alert card or wear medical alert jewelry.  Ask your health care provider: ? Do I need to meet with a diabetes educator? ? Where can I find a support group for people with diabetes?  Keep all follow-up visits during your pregnancy (prenatal) and after delivery (postnatal) as told by your health care provider. This is important. Get the care that you need after delivery  Have your blood glucose level checked 4-12 weeks after delivery. This is done with an oral  glucose tolerance test (OGTT).  Get screened for diabetes at least every 3 years, or as often as told by your health care provider. Where to find more information: To learn more about gestational diabetes, visit:  American Diabetes Association (ADA): www.diabetes.org/diabetes-basics/gestational  Centers for Disease Control and Prevention (CDC): http://sanchez-watson.com/.pdf  This information is not intended to replace advice given to you by your health care provider. Make sure you discuss any questions you have with your health care provider. Document Released: 11/02/2015 Document Revised: 12/17/2015 Document Reviewed: 08/14/2015 Elsevier Interactive Patient Education  2018 [employer].  How to Avoid Diabetes Mellitus Problems You can take action to prevent or slow down problems that are caused by diabetes (diabetes mellitus). Following your diabetes plan and taking care of yourself can reduce your risk of serious or life-threatening complications. Manage your diabetes  Follow instructions from your health care providers about managing your diabetes. Your diabetes may be managed by a team of health care providers who can teach you how to care for yourself and can answer questions that you have.  Educate yourself about your condition so you can make healthy choices about eating and physical activity.  Check your blood sugar (glucose) levels as often as directed. Your  health care provider will help you decide how often to check your blood glucose level depending on your treatment goals and how well you are meeting them.  Ask your health care provider if you should take low-dose aspirin daily and what dose is recommended for you. Taking low-dose aspirin daily is recommended to help prevent cardiovascular disease. Do not use nicotine or tobacco Do not use any products that contain nicotine or tobacco, such as cigarettes and e-cigarettes. If you need help quitting, ask your health care provider. Nicotine raises your risk for diabetes problems. If you quit using nicotine:  You will lower your risk for heart attack, stroke, nerve disease, and kidney disease.  Your cholesterol and blood pressure may improve.  Your blood circulation will improve.  Keep your blood pressure under control To control your blood pressure:  Follow instructions from your health care provider about meal planning, exercise, and medicines.  Make sure your health care provider checks your blood pressure at every medical visit.  A blood pressure reading consists of two numbers. Generally, the goal is to keep your top number (systolic pressure) at or below 130, and your bottom number (diastolic pressure) at or below 80. Your health care provider may recommend a lower target blood pressure. Your individualized target blood pressure is determined based on:  Your age.  Your medicines.  How long you have had diabetes.  Any other medical conditions you have.  Keep your cholesterol under control To control your cholesterol:  Follow instructions from your health care provider about meal planning, exercise, and medicines.  Have your cholesterol checked at least once a year.  You may be prescribed medicine to lower cholesterol (statin). If you are not taking a statin, ask your health care provider if you should be.  Controlling your cholesterol may:  Help prevent heart disease and  stroke. These are the most common health problems for people with diabetes.  Improve your blood flow.  Schedule and keep yearly physical exams and eye exams Your health care provider will tell you how often you need medical visits depending on your diabetes management plan. Keep all follow-up visits as directed. This is important so possible problems can be identified early and complications can be avoided or treated.  Every visit with your health care provider should include measuring your: ? Weight. ? Blood pressure. ? Blood glucose control.  Your A1c (hemoglobin A1c) level should be checked: ? At least 2 times a year, if you are meeting your treatment goals. ? 4 times a year, if you are not meeting treatment goals or if your treatment goals have changed.  Your blood lipids (lipid profile) should be checked yearly. You should also be checked yearly for protein in your urine (urine microalbumin).  If you have type 1 diabetes, get an eye exam 3-5 years after you are diagnosed, and then once a year after your first exam.  If you have type 2 diabetes, get an eye exam as soon as you are diagnosed, and then once a year after your first exam.  Keep your vaccines current It is recommended that you receive:  A flu (influenza) vaccine every year.  A pneumonia (pneumococcal) vaccine and a hepatitis B vaccine. If you are age 71 or older, you may get the pneumonia vaccine as a series of two separate shots.  Ask your health care provider which other vaccines may be recommended. Take care of your feet Diabetes may cause you to have poor blood circulation to your legs and feet. Because of this, taking care of your feet is very important. Diabetes can cause:  The skin on the feet to get thinner, break more easily, and heal more slowly.  Nerve damage in your legs and feet, which results in decreased feeling. You may not notice minor injuries that could lead to serious problems.  To avoid foot  problems:  Check your skin and feet every day for cuts, bruises, redness, blisters, or sores.  Schedule a foot exam with your health care provider once every year. This exam includes: ? Inspecting of the structure and skin of your feet. ? Checking the pulses and sensation in your feet.  Make sure that your health care provider performs a visual foot exam at every medical visit.  Take care of your teeth People with poorly controlled diabetes are more likely to have gum (periodontal) disease. Diabetes can make periodontal diseases harder to control. If not treated, periodontal diseases can lead to tooth loss. To prevent this:  Brush your teeth twice a day.  Floss at least once a day.  Visit your dentist 2 times a year.  Drink responsibly Limit alcohol intake to no more than 1 drink a day for nonpregnant women and 2 drinks a day for men. One drink equals 12 oz of beer, 5 oz of wine, or 1 oz of hard liquor. It is important to eat food when you drink alcohol to avoid low blood glucose (hypoglycemia). Avoid alcohol if you:  Have a history of alcohol abuse or dependence.  Are pregnant.  Have liver disease, pancreatitis, advanced neuropathy, or severe hypertriglyceridemia.  Lessen stress Living with diabetes can be stressful. When you are experiencing stress, your blood glucose may be affected in two ways:  Stress hormones may cause your blood glucose to rise.  You may be distracted from taking good care of yourself.  Be aware of your stress level and make changes to help you manage challenging situations. To lower your stress levels:  Consider joining a support group.  Do planned relaxation or meditation.  Do a hobby that you enjoy.  Maintain healthy relationships.  Exercise regularly.  Work with your health care provider or a mental health professional.  Summary  You can take action to  prevent or slow down problems that are caused by diabetes (diabetes mellitus).  Following your diabetes plan and taking care of yourself can reduce your risk of serious or life-threatening complications.  Follow instructions from your health care providers about managing your diabetes. Your diabetes may be managed by a team of health care providers who can teach you how to care for yourself and can answer questions that you have.  Your health care provider will tell you how often you need medical visits depending on your diabetes management plan. Keep all follow-up visits as directed. This is important so possible problems can be identified early and complications can be avoided or treated. This information is not intended to replace advice given to you by your health care provider. Make sure you discuss any questions you have with your health care provider. Document Released: 03/29/2011 Document Revised: 04/09/2016 Document Reviewed: 04/09/2016 Elsevier Interactive Patient Education  2018 [employer].  Diabetes Mellitus and Nutrition When you have diabetes (diabetes mellitus), it is very important to have healthy eating habits because your blood sugar (glucose) levels are greatly affected by what you eat and drink. Eating healthy foods in the appropriate amounts, at about the same times every day, can help you:  Control your blood glucose.  Lower your risk of heart disease.  Improve your blood pressure.  Reach or maintain a healthy weight.  Every person with diabetes is different, and each person has different needs for a meal plan. Your health care provider may recommend that you work with a diet and nutrition specialist (dietitian) to make a meal plan that is best for you. Your meal plan may vary depending on factors such as:  The calories you need.  The medicines you take.  Your weight.  Your blood glucose, blood pressure, and cholesterol levels.  Your activity level.  Other health conditions you have, such as heart or kidney disease.  How do  carbohydrates affect me? Carbohydrates affect your blood glucose level more than any other type of food. Eating carbohydrates naturally increases the amount of glucose in your blood. Carbohydrate counting is a method for keeping track of how many carbohydrates you eat. Counting carbohydrates is important to keep your blood glucose at a healthy level, especially if you use insulin or take certain oral diabetes medicines. It is important to know how many carbohydrates you can safely have in each meal. This is different for every person. Your dietitian can help you calculate how many carbohydrates you should have at each meal and for snack. Foods that contain carbohydrates include:  Bread, cereal, rice, pasta, and crackers.  Potatoes and corn.  Peas, beans, and lentils.  Milk and yogurt.  Fruit and juice.  Desserts, such as cakes, cookies, ice cream, and candy.  How does alcohol affect me? Alcohol can cause a sudden decrease in blood glucose (hypoglycemia), especially if you use insulin or take certain oral diabetes medicines. Hypoglycemia can be a life-threatening condition. Symptoms of hypoglycemia (sleepiness, dizziness, and confusion) are similar to symptoms of having too much alcohol. If your health care provider says that alcohol is safe for you, follow these guidelines:  Limit alcohol intake to no more than 1 drink per day for nonpregnant women and 2 drinks per day for men. One drink equals 12 oz of beer, 5 oz of wine, or 1 oz of hard liquor.  Do not drink on an empty stomach.  Keep yourself hydrated with water, diet soda, or unsweetened iced tea.  Keep in  mind that regular soda, juice, and other mixers may contain a lot of sugar and must be counted as carbohydrates.  What are tips for following this plan? Reading food labels  Start by checking the serving size on the label. The amount of calories, carbohydrates, fats, and other nutrients listed on the label are based on one  serving of the food. Many foods contain more than one serving per package.  Check the total grams (g) of carbohydrates in one serving. You can calculate the number of servings of carbohydrates in one serving by dividing the total carbohydrates by 15. For example, if a food has 30 g of total carbohydrates, it would be equal to 2 servings of carbohydrates.  Check the number of grams (g) of saturated and trans fats in one serving. Choose foods that have low or no amount of these fats.  Check the number of milligrams (mg) of sodium in one serving. Most people should limit total sodium intake to less than 2,300 mg per day.  Always check the nutrition information of foods labeled as "low-fat" or "nonfat". These foods may be higher in added sugar or refined carbohydrates and should be avoided.  Talk to your dietitian to identify your daily goals for nutrients listed on the label. Shopping  Avoid buying canned, premade, or processed foods. These foods tend to be high in fat, sodium, and added sugar.  Shop around the outside edge of the grocery store. This includes fresh fruits and vegetables, bulk grains, fresh meats, and fresh dairy. Cooking  Use low-heat cooking methods, such as baking, instead of high-heat cooking methods like deep frying.  Cook using healthy oils, such as olive, canola, or sunflower oil.  Avoid cooking with butter, cream, or high-fat meats. Meal planning  Eat meals and snacks regularly, preferably at the same times every day. Avoid going long periods of time without eating.  Eat foods high in fiber, such as fresh fruits, vegetables, beans, and whole grains. Talk to your dietitian about how many servings of carbohydrates you can eat at each meal.  Eat 4-6 ounces of lean protein each day, such as lean meat, chicken, fish, eggs, or tofu. 1 ounce is equal to 1 ounce of meat, chicken, or fish, 1 egg, or 1/4 cup of tofu.  Eat some foods each day that contain healthy fats, such  as avocado, nuts, seeds, and fish. Lifestyle   Check your blood glucose regularly.  Exercise at least 30 minutes 5 or more days each week, or as told by your health care provider.  Take medicines as told by your health care provider.  Do not use any products that contain nicotine or tobacco, such as cigarettes and e-cigarettes. If you need help quitting, ask your health care provider.  Work with a Social worker or diabetes educator to identify strategies to manage stress and any emotional and social challenges. What are some questions to ask my health care provider?  Do I need to meet with a diabetes educator?  Do I need to meet with a dietitian?  What number can I call if I have questions?  When are the best times to check my blood glucose? Where to find more information:  American Diabetes Association: diabetes.org/food-and-fitness/food  Academy of Nutrition and Dietetics: PokerClues.dk  [employer] of Diabetes and Digestive and Kidney Diseases (NIH): ContactWire.be Summary  A healthy meal plan will help you control your blood glucose and maintain a healthy lifestyle.  Working with a diet and nutrition specialist (dietitian) can  help you make a meal plan that is best for you.  Keep in mind that carbohydrates and alcohol have immediate effects on your blood glucose levels. It is important to count carbohydrates and to use alcohol carefully. This information is not intended to replace advice given to you by your health care provider. Make sure you discuss any questions you have with your health care provider. Document Released: 04/07/2005 Document Revised: 08/15/2016 Document Reviewed: 08/15/2016 Elsevier Interactive Patient Education  [employer].

## 2017-07-25 NOTE — L&D Delivery Note (Addendum)
Patient is a 27 y.o. now [id number] s/p NSVD at 4441w0d, who was admitted for IOL for A1GDM.  She progressed with augmentation to complete and pushed to deliver. AROM at delivery with some meconium. Cord clamping delayed by 1 minute then clamped by me with supervision and cut by patient.  Placenta intact and spontaneous, bleeding moderate. Some clots expressed with fundal massage. One periurethral abrasion noted but no lacerations requiring repair.  She requests Depo for birth control.  Delivery Note At 8:54 PM a viable female was delivered via Vaginal, Spontaneous (Presentation: ROA).  APGAR: 9, 9; weight pending.   Placenta status: intact.  Cord: 3V with the following complications: none.  Cord pH: N/A  Anesthesia:  None Episiotomy: None Lacerations:  None Suture Repair: None Est. Blood Loss (mL): 350  Mom to postpartum.  Baby to Couplet care / Skin to Skin.  Ellwood DenseAlison Rumball, DO 10/01/17, 9:11 PM  OB FELLOW DELIVERY ATTESTATION  I was gloved and present for the delivery in its entirety, and I agree with the above resident's note.    -Called into room after delivery due to continued bleeding. 800mcg of rectal cytotec given. Evacuated clots and did manual sweep of lower uterine segment. Will given one dose of Ancef.   Caryl AdaJazma Jayd Cadieux, DO OB Fellow 9:25 PM

## 2017-08-03 ENCOUNTER — Encounter: Payer: Medicaid Other | Admitting: Obstetrics

## 2017-08-03 ENCOUNTER — Other Ambulatory Visit: Payer: Self-pay

## 2017-08-03 ENCOUNTER — Encounter (HOSPITAL_COMMUNITY): Payer: Self-pay | Admitting: Emergency Medicine

## 2017-08-03 ENCOUNTER — Inpatient Hospital Stay (HOSPITAL_COMMUNITY)
Admission: AD | Admit: 2017-08-03 | Discharge: 2017-08-03 | Disposition: A | Discharge status: Home / Self Care | Payer: No Typology Code available for payment source | Attending: Obstetrics and Gynecology | Admitting: Obstetrics and Gynecology

## 2017-08-03 DIAGNOSIS — O9A213 Injury, poisoning and certain other consequences of external causes complicating pregnancy, third trimester: Secondary | ICD-10-CM

## 2017-08-03 DIAGNOSIS — Z3A31 31 weeks gestation of pregnancy: Secondary | ICD-10-CM | POA: Insufficient documentation

## 2017-08-03 DIAGNOSIS — O26893 Other specified pregnancy related conditions, third trimester: Secondary | ICD-10-CM | POA: Insufficient documentation

## 2017-08-03 DIAGNOSIS — R1030 Lower abdominal pain, unspecified: Secondary | ICD-10-CM | POA: Diagnosis not present

## 2017-08-03 DIAGNOSIS — O24419 Gestational diabetes mellitus in pregnancy, unspecified control: Secondary | ICD-10-CM | POA: Insufficient documentation

## 2017-08-03 DIAGNOSIS — M545 Low back pain: Secondary | ICD-10-CM | POA: Insufficient documentation

## 2017-08-03 DIAGNOSIS — O99343 Other mental disorders complicating pregnancy, third trimester: Secondary | ICD-10-CM | POA: Diagnosis not present

## 2017-08-03 DIAGNOSIS — O9989 Other specified diseases and conditions complicating pregnancy, childbirth and the puerperium: Secondary | ICD-10-CM

## 2017-08-03 DIAGNOSIS — T1490XA Injury, unspecified, initial encounter: Secondary | ICD-10-CM

## 2017-08-03 DIAGNOSIS — Z87891 Personal history of nicotine dependence: Secondary | ICD-10-CM | POA: Insufficient documentation

## 2017-08-03 DIAGNOSIS — Y92410 Unspecified street and highway as the place of occurrence of the external cause: Secondary | ICD-10-CM | POA: Diagnosis not present

## 2017-08-03 HISTORY — DX: Gestational diabetes mellitus in pregnancy, unspecified control: O24.419

## 2017-08-03 LAB — CBC WITH DIFFERENTIAL/PLATELET
BASOS PCT: 1 %
Basophils Absolute: 0.1 10*3/uL (ref 0.0–0.1)
EOS ABS: 0.1 10*3/uL (ref 0.0–0.7)
EOS PCT: 1 %
HCT: 36.3 % (ref 36.0–46.0)
Hemoglobin: 11.5 g/dL — ABNORMAL LOW (ref 12.0–15.0)
LYMPHS PCT: 20 %
Lymphs Abs: 1.9 10*3/uL (ref 0.7–4.0)
MCH: 26.6 pg (ref 26.0–34.0)
MCHC: 31.7 g/dL (ref 30.0–36.0)
MCV: 83.8 fL (ref 78.0–100.0)
MONO ABS: 1 10*3/uL (ref 0.1–1.0)
Monocytes Relative: 10 %
Neutro Abs: 6.4 10*3/uL (ref 1.7–7.7)
Neutrophils Relative %: 68 %
PLATELETS: 267 10*3/uL (ref 150–400)
RBC: 4.33 MIL/uL (ref 3.87–5.11)
RDW: 13.6 % (ref 11.5–15.5)
WBC: 9.4 10*3/uL (ref 4.0–10.5)

## 2017-08-03 LAB — BASIC METABOLIC PANEL
Anion gap: 9 (ref 5–15)
BUN: <5 mg/dL — ABNORMAL LOW (ref 6–20)
CALCIUM: 8.5 mg/dL — AB (ref 8.9–10.3)
CO2: 19 mmol/L — ABNORMAL LOW (ref 22–32)
Chloride: 107 mmol/L (ref 101–111)
Creatinine, Ser: 0.46 mg/dL (ref 0.44–1.00)
GFR calc Af Amer: >60 mL/min (ref >60)
Glucose, Bld: 82 mg/dL (ref 65–99)
Potassium: 3.8 mmol/L (ref 3.5–5.1)
SODIUM: 135 mmol/L (ref 135–145)

## 2017-08-03 LAB — LIPASE, BLOOD: LIPASE: 24 U/L (ref 11–51)

## 2017-08-03 MED ORDER — LACTATED RINGERS IV BOLUS (SEPSIS)
1000.0000 mL | Freq: Once | INTRAVENOUS | Status: AC
  Administered 2017-08-03: 1000 mL via INTRAVENOUS

## 2017-08-03 NOTE — Progress Notes (Signed)
   08/03/17 1300  Clinical Encounter Type  Visited With Patient  Visit Type ED  Referral From Nurse  Consult/Referral To Chaplain  Spiritual Encounters  Spiritual Needs Emotional   [facility]  Meet the patient as she entered the ED.  Asked about family she asked me to call her boyfriend.  Was able to reach him, he is on the way.  Provided comfort until medical team began to assess needs. Will follow as needed. Chaplain Agustin CreeNewton Maury Bamba
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
## 2017-08-03 NOTE — ED Notes (Signed)
[facility] called @ 1342/Transfer to [facility]-per RN
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
## 2017-08-03 NOTE — ED Notes (Signed)
Rapid ob nurse gave report to [facility] and [facility]. Paperwork printed for [facility]

## 2017-08-03 NOTE — Discharge Instructions (Signed)
Keep your appointments in the office. Return if you have vaginal bleeding, leaking of fluid or sudden, severe lower abdominal pain or your abdomen is completely hard and does not get soft.
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
## 2017-08-04 ENCOUNTER — Ambulatory Visit (INDEPENDENT_AMBULATORY_CARE_PROVIDER_SITE_OTHER): Payer: Medicaid Other | Admitting: Obstetrics

## 2017-08-04 ENCOUNTER — Encounter (HOSPITAL_COMMUNITY): Payer: Self-pay

## 2017-08-04 ENCOUNTER — Ambulatory Visit (HOSPITAL_COMMUNITY): Admission: RE | Admit: 2017-08-04 | Payer: Medicaid Other | Source: Ambulatory Visit

## 2017-08-04 ENCOUNTER — Encounter: Payer: Self-pay | Admitting: Obstetrics

## 2017-08-04 VITALS — BP 123/69 | HR 100 | Wt 199.0 lb

## 2017-08-04 DIAGNOSIS — Z8759 Personal history of other complications of pregnancy, childbirth and the puerperium: Secondary | ICD-10-CM

## 2017-08-04 DIAGNOSIS — Z3483 Encounter for supervision of other normal pregnancy, third trimester: Secondary | ICD-10-CM

## 2017-08-04 DIAGNOSIS — O24410 Gestational diabetes mellitus in pregnancy, diet controlled: Secondary | ICD-10-CM

## 2017-08-04 DIAGNOSIS — Z3480 Encounter for supervision of other normal pregnancy, unspecified trimester: Secondary | ICD-10-CM

## 2017-08-04 NOTE — Progress Notes (Signed)
Subjective:  Sandra Anderson is a 27 y.o. [id number] at 2756w5d being seen today for ongoing prenatal care.  She is currently monitored for the following issues for this high-risk pregnancy and has Supervision of other normal pregnancy, antepartum; History of ectopic pregnancy; History of prior pregnancy with IUGR newborn; Schizophrenia (HCC); Maternal varicella, non-immune; Bipolar affective (HCC); ASCUS of cervix with negative high risk HPV; and Gestational diabetes on their problem list.  Patient reports occasional contractions.  Contractions: Not present. Vag. Bleeding: None.  Movement: Present. Denies leaking of fluid.   The following portions of the patient's history were reviewed and updated as appropriate: allergies, current medications, past family history, past medical history, past social history, past surgical history and problem list. Problem list updated.  Objective:   Vitals:   08/04/17 1023  BP: 123/69  Pulse: 100  Weight: 199 lb (90.3 kg)    Fetal Status:     Movement: Present     General:  Alert, oriented and cooperative. Patient is in no acute distress.  Skin: Skin is warm and dry. No rash noted.   Cardiovascular: Normal heart rate noted  Respiratory: Normal respiratory effort, no problems with respiration noted  Abdomen: Soft, gravid, appropriate for gestational age. Pain/Pressure: Absent     Pelvic:  Cervical exam deferred        Extremities: Normal range of motion.  Edema: None  Mental Status: Normal mood and affect. Normal behavior. Normal judgment and thought content.   Urinalysis:      Assessment and Plan:  Pregnancy: [id number] at 4056w5d  1. Supervision of other normal pregnancy, antepartum  2. Diet controlled White classification A1 gestational diabetes mellitus (GDM)  3. History of prior pregnancy with IUGR newborn  There are no diagnoses linked to this encounter. Preterm labor symptoms and general obstetric precautions including but not limited to vaginal  bleeding, contractions, leaking of fluid and fetal movement were reviewed in detail with the patient. Please refer to After Visit Summary for other counseling recommendations.  Return in about 1 week (around 08/11/2017) for ROB.   Brock BadHarper, Dezmin Kittelson A, MD

## 2017-08-08 ENCOUNTER — Other Ambulatory Visit: Payer: Self-pay | Admitting: Obstetrics

## 2017-08-08 ENCOUNTER — Ambulatory Visit (HOSPITAL_COMMUNITY)
Admission: RE | Admit: 2017-08-08 | Discharge: 2017-08-08 | Disposition: A | Discharge status: Home / Self Care | Payer: Medicaid Other | Source: Ambulatory Visit | Attending: Obstetrics | Admitting: Obstetrics

## 2017-08-08 ENCOUNTER — Encounter (HOSPITAL_COMMUNITY): Payer: Self-pay

## 2017-08-08 ENCOUNTER — Other Ambulatory Visit (HOSPITAL_COMMUNITY): Payer: Self-pay | Admitting: *Deleted

## 2017-08-08 DIAGNOSIS — O24410 Gestational diabetes mellitus in pregnancy, diet controlled: Secondary | ICD-10-CM | POA: Insufficient documentation

## 2017-08-08 DIAGNOSIS — Z3A32 32 weeks gestation of pregnancy: Secondary | ICD-10-CM | POA: Diagnosis not present

## 2017-08-08 DIAGNOSIS — O09299 Supervision of pregnancy with other poor reproductive or obstetric history, unspecified trimester: Secondary | ICD-10-CM

## 2017-08-09 ENCOUNTER — Ambulatory Visit: Payer: Medicaid Other | Admitting: Registered"

## 2017-08-15 ENCOUNTER — Encounter: Payer: Medicaid Other | Attending: Obstetrics | Admitting: Registered"

## 2017-08-15 ENCOUNTER — Encounter: Payer: Self-pay | Admitting: Registered"

## 2017-08-15 DIAGNOSIS — O24419 Gestational diabetes mellitus in pregnancy, unspecified control: Secondary | ICD-10-CM | POA: Insufficient documentation

## 2017-08-15 DIAGNOSIS — R7309 Other abnormal glucose: Secondary | ICD-10-CM

## 2017-08-15 DIAGNOSIS — Z713 Dietary counseling and surveillance: Secondary | ICD-10-CM | POA: Insufficient documentation

## 2017-08-15 NOTE — Progress Notes (Signed)
Patient was seen on 08/15/17 for Gestational Diabetes self-management class at the [facility]. The following learning objectives were met by the patient during this course:   States the definition of Gestational Diabetes  States why dietary management is important in controlling blood glucose  Describes the effects each nutrient has on blood glucose levels  Demonstrates ability to create a balanced meal plan  Demonstrates carbohydrate counting   States when to check blood glucose levels  Demonstrates proper blood glucose monitoring techniques  States the effect of stress and exercise on blood glucose levels  States the importance of limiting caffeine and abstaining from alcohol and smoking  Blood glucose monitor given: none Lot # n/a Exp: n/a Blood glucose reading: n/a  Patient instructed to monitor glucose levels: FBS: 60 - <95 1 hour: <140 2 hour: <120  Patient received handouts:  Nutrition Diabetes and Pregnancy  Carbohydrate Counting List  Patient will be seen for follow-up as needed.

## 2017-08-18 ENCOUNTER — Encounter: Payer: Self-pay | Admitting: Obstetrics

## 2017-08-18 ENCOUNTER — Ambulatory Visit (INDEPENDENT_AMBULATORY_CARE_PROVIDER_SITE_OTHER): Payer: Medicaid Other | Admitting: Obstetrics

## 2017-08-18 VITALS — BP 108/78 | HR 90 | Wt 197.0 lb

## 2017-08-18 DIAGNOSIS — F319 Bipolar disorder, unspecified: Secondary | ICD-10-CM

## 2017-08-18 DIAGNOSIS — Z8659 Personal history of other mental and behavioral disorders: Secondary | ICD-10-CM

## 2017-08-18 DIAGNOSIS — O0990 Supervision of high risk pregnancy, unspecified, unspecified trimester: Secondary | ICD-10-CM

## 2017-08-18 DIAGNOSIS — O0993 Supervision of high risk pregnancy, unspecified, third trimester: Secondary | ICD-10-CM

## 2017-08-18 DIAGNOSIS — O24410 Gestational diabetes mellitus in pregnancy, diet controlled: Secondary | ICD-10-CM

## 2017-08-18 DIAGNOSIS — Z8759 Personal history of other complications of pregnancy, childbirth and the puerperium: Secondary | ICD-10-CM

## 2017-08-18 NOTE — Progress Notes (Signed)
Subjective:  Sandra Anderson is a 27 y.o. [id number] at 9660w5d being seen today for ongoing prenatal care.  She is currently monitored for the following issues for this high-risk pregnancy and has Supervision of other normal pregnancy, antepartum; History of ectopic pregnancy; History of prior pregnancy with IUGR newborn; Schizophrenia (HCC); Maternal varicella, non-immune; Bipolar affective (HCC); ASCUS of cervix with negative high risk HPV; Gestational diabetes; and Impaired glucose tolerance test on their problem list.  Patient reports no complaints.  Contractions: Not present. Vag. Bleeding: None.  Movement: Present. Denies leaking of fluid.   The following portions of the patient's history were reviewed and updated as appropriate: allergies, current medications, past family history, past medical history, past social history, past surgical history and problem list. Problem list updated.  Objective:   Vitals:   08/18/17 1023  BP: 108/78  Pulse: 90  Weight: 197 lb (89.4 kg)    Fetal Status: Fetal Heart Rate (bpm): 140   Movement: Present     General:  Alert, oriented and cooperative. Patient is in no acute distress.  Skin: Skin is warm and dry. No rash noted.   Cardiovascular: Normal heart rate noted  Respiratory: Normal respiratory effort, no problems with respiration noted  Abdomen: Soft, gravid, appropriate for gestational age. Pain/Pressure: Absent     Pelvic:  Cervical exam deferred        Extremities: Normal range of motion.  Edema: None  Mental Status: Normal mood and affect. Normal behavior. Normal judgment and thought content.   Urinalysis:      Assessment and Plan:  Pregnancy: A5W0981G6P4014 at 4360w5d  1. Supervision of high risk pregnancy, antepartum - doing well  2. Diet controlled White classification A1 gestational diabetes mellitus (GDM) - stable blood glucose  3. History of prior pregnancy with IUGR newborn - interval growth scans at 50th percentile with normal fluid at 32  weeks  4. Bipolar affective disorder, remission status unspecified (HCC) - stable clinically  5. History of postpartum depression   Preterm labor symptoms and general obstetric precautions including but not limited to vaginal bleeding, contractions, leaking of fluid and fetal movement were reviewed in detail with the patient. Please refer to After Visit Summary for other counseling recommendations.  Return in about 2 weeks (around 09/01/2017) for ROB.  GBS.Marland Kitchen.   Brock BadHarper, Charles A, MD

## 2017-09-01 ENCOUNTER — Encounter: Payer: Medicaid Other | Admitting: Obstetrics

## 2017-09-05 ENCOUNTER — Other Ambulatory Visit (HOSPITAL_COMMUNITY): Payer: Self-pay | Admitting: Obstetrics and Gynecology

## 2017-09-05 ENCOUNTER — Ambulatory Visit (HOSPITAL_COMMUNITY)
Admission: RE | Admit: 2017-09-05 | Discharge: 2017-09-05 | Disposition: A | Discharge status: Home / Self Care | Payer: Medicaid Other | Source: Ambulatory Visit | Attending: Obstetrics | Admitting: Obstetrics

## 2017-09-05 ENCOUNTER — Encounter (HOSPITAL_COMMUNITY): Payer: Self-pay

## 2017-09-05 DIAGNOSIS — O24410 Gestational diabetes mellitus in pregnancy, diet controlled: Secondary | ICD-10-CM | POA: Insufficient documentation

## 2017-09-05 DIAGNOSIS — O09299 Supervision of pregnancy with other poor reproductive or obstetric history, unspecified trimester: Secondary | ICD-10-CM | POA: Insufficient documentation

## 2017-09-05 DIAGNOSIS — O0933 Supervision of pregnancy with insufficient antenatal care, third trimester: Secondary | ICD-10-CM | POA: Diagnosis not present

## 2017-09-05 DIAGNOSIS — Z283 Underimmunization status: Secondary | ICD-10-CM

## 2017-09-05 DIAGNOSIS — F319 Bipolar disorder, unspecified: Secondary | ICD-10-CM

## 2017-09-05 DIAGNOSIS — Z3A36 36 weeks gestation of pregnancy: Secondary | ICD-10-CM | POA: Insufficient documentation

## 2017-09-05 DIAGNOSIS — R87610 Atypical squamous cells of undetermined significance on cytologic smear of cervix (ASC-US): Secondary | ICD-10-CM

## 2017-09-05 DIAGNOSIS — O09899 Supervision of other high risk pregnancies, unspecified trimester: Secondary | ICD-10-CM

## 2017-09-05 DIAGNOSIS — Z3480 Encounter for supervision of other normal pregnancy, unspecified trimester: Secondary | ICD-10-CM

## 2017-09-11 ENCOUNTER — Encounter: Payer: Self-pay | Admitting: Obstetrics

## 2017-09-11 ENCOUNTER — Other Ambulatory Visit (HOSPITAL_COMMUNITY)
Admission: RE | Admit: 2017-09-11 | Discharge: 2017-09-11 | Disposition: A | Discharge status: Home / Self Care | Payer: Medicaid Other | Source: Ambulatory Visit | Attending: Obstetrics | Admitting: Obstetrics

## 2017-09-11 ENCOUNTER — Other Ambulatory Visit: Payer: Self-pay

## 2017-09-11 ENCOUNTER — Ambulatory Visit (INDEPENDENT_AMBULATORY_CARE_PROVIDER_SITE_OTHER): Payer: Medicaid Other | Admitting: Obstetrics

## 2017-09-11 VITALS — BP 116/78 | HR 62 | Wt 194.0 lb

## 2017-09-11 DIAGNOSIS — N898 Other specified noninflammatory disorders of vagina: Secondary | ICD-10-CM

## 2017-09-11 DIAGNOSIS — F319 Bipolar disorder, unspecified: Secondary | ICD-10-CM

## 2017-09-11 DIAGNOSIS — R87610 Atypical squamous cells of undetermined significance on cytologic smear of cervix (ASC-US): Secondary | ICD-10-CM

## 2017-09-11 DIAGNOSIS — O0993 Supervision of high risk pregnancy, unspecified, third trimester: Secondary | ICD-10-CM

## 2017-09-11 DIAGNOSIS — Z8659 Personal history of other mental and behavioral disorders: Secondary | ICD-10-CM

## 2017-09-11 DIAGNOSIS — O24410 Gestational diabetes mellitus in pregnancy, diet controlled: Secondary | ICD-10-CM

## 2017-09-11 DIAGNOSIS — O0990 Supervision of high risk pregnancy, unspecified, unspecified trimester: Secondary | ICD-10-CM

## 2017-09-11 DIAGNOSIS — Z8759 Personal history of other complications of pregnancy, childbirth and the puerperium: Secondary | ICD-10-CM

## 2017-09-11 NOTE — Progress Notes (Signed)
Subjective:  Sandra Anderson is a 27 y.o. [id number] at 7562w1d being seen today for ongoing prenatal care.  She is currently monitored for the following issues for this high-risk pregnancy and has Supervision of other normal pregnancy, antepartum; History of ectopic pregnancy; History of prior pregnancy with IUGR newborn; Schizophrenia (HCC); Maternal varicella, non-immune; Bipolar affective (HCC); ASCUS of cervix with negative high risk HPV; Gestational diabetes; and Impaired glucose tolerance test on their problem list.  Patient reports no complaints.  Contractions: Not present. Vag. Bleeding: None.  Movement: Present. Denies leaking of fluid.   The following portions of the patient's history were reviewed and updated as appropriate: allergies, current medications, past family history, past medical history, past social history, past surgical history and problem list. Problem list updated.  Objective:   Vitals:   09/11/17 1033  BP: 116/78  Pulse: 62  Weight: 194 lb (88 kg)    Fetal Status: Fetal Heart Rate (bpm): 140   Movement: Present     General:  Alert, oriented and cooperative. Patient is in no acute distress.  Skin: Skin is warm and dry. No rash noted.   Cardiovascular: Normal heart rate noted  Respiratory: Normal respiratory effort, no problems with respiration noted  Abdomen: Soft, gravid, appropriate for gestational age. Pain/Pressure: Present     Pelvic:  Cervical exam deferred        Extremities: Normal range of motion.  Edema: None  Mental Status: Normal mood and affect. Normal behavior. Normal judgment and thought content.   Urinalysis:      Assessment and Plan:  Pregnancy: A5W0981G6P4014 at 6462w1d  1. Supervision of high risk pregnancy, antepartum Rx: - Strep Gp B NAA  2. Diet controlled White classification A1 gestational diabetes mellitus (GDM) - good glucose control  3. History of prior pregnancy with IUGR newborn  4. Bipolar affective disorder, remission status  unspecified (HCC) - stable  5. History of postpartum depression - stable   6. ASCUS of cervix with negative high risk HPV  7. Vaginal discharge Rx - Cervicovaginal ancillary only  Term labor symptoms and general obstetric precautions including but not limited to vaginal bleeding, contractions, leaking of fluid and fetal movement were reviewed in detail with the patient. Please refer to After Visit Summary for other counseling recommendations.  Return in about 1 week (around 09/18/2017) for ROB.   Brock BadHarper, Jaray Boliver A, MD

## 2017-09-11 NOTE — Progress Notes (Signed)
ROB GBS 

## 2017-09-12 LAB — CERVICOVAGINAL ANCILLARY ONLY
Bacterial vaginitis: POSITIVE — AB
Candida vaginitis: POSITIVE — AB
Chlamydia: NEGATIVE
Neisseria Gonorrhea: NEGATIVE
TRICH (WINDOWPATH): NEGATIVE

## 2017-09-12 LAB — OB RESULTS CONSOLE GBS: STREP GROUP B AG: POSITIVE

## 2017-09-13 ENCOUNTER — Other Ambulatory Visit: Payer: Self-pay | Admitting: Obstetrics

## 2017-09-13 DIAGNOSIS — B3731 Acute candidiasis of vulva and vagina: Secondary | ICD-10-CM

## 2017-09-13 DIAGNOSIS — B373 Candidiasis of vulva and vagina: Secondary | ICD-10-CM

## 2017-09-13 DIAGNOSIS — B9689 Other specified bacterial agents as the cause of diseases classified elsewhere: Secondary | ICD-10-CM

## 2017-09-13 DIAGNOSIS — N760 Acute vaginitis: Principal | ICD-10-CM

## 2017-09-13 LAB — STREP GP B NAA: STREP GROUP B AG: POSITIVE — AB

## 2017-09-13 MED ORDER — SECNIDAZOLE 2 G PO PACK: 1.0000 | PACK | Freq: Once | ORAL | 2 refills | Status: AC

## 2017-09-13 MED ORDER — FLUCONAZOLE 150 MG PO TABS: 150.0000 mg | ORAL_TABLET | Freq: Once | ORAL | 0 refills | Status: AC

## 2017-09-18 ENCOUNTER — Ambulatory Visit (INDEPENDENT_AMBULATORY_CARE_PROVIDER_SITE_OTHER): Payer: Medicaid Other | Admitting: Certified Nurse Midwife

## 2017-09-18 ENCOUNTER — Encounter: Payer: Self-pay | Admitting: Certified Nurse Midwife

## 2017-09-18 VITALS — BP 138/69 | HR 63 | Wt 201.6 lb

## 2017-09-18 DIAGNOSIS — O0993 Supervision of high risk pregnancy, unspecified, third trimester: Secondary | ICD-10-CM

## 2017-09-18 DIAGNOSIS — O0990 Supervision of high risk pregnancy, unspecified, unspecified trimester: Secondary | ICD-10-CM

## 2017-09-18 DIAGNOSIS — B951 Streptococcus, group B, as the cause of diseases classified elsewhere: Secondary | ICD-10-CM | POA: Insufficient documentation

## 2017-09-18 DIAGNOSIS — O24419 Gestational diabetes mellitus in pregnancy, unspecified control: Secondary | ICD-10-CM | POA: Diagnosis not present

## 2017-09-18 LAB — GLUCOSE, POCT (MANUAL RESULT ENTRY): POC GLUCOSE: 112 mg/dL — AB (ref 70–99)

## 2017-09-18 NOTE — Progress Notes (Signed)
   PRENATAL VISIT NOTE  Subjective:  Sandra Anderson is a 27 y.o. G5P3013 at 341w1d being seen today for ongoing prenatal care.  She is currently monitored for the following issues for this high-risk pregnancy and has Supervision of high risk pregnancy, antepartum; History of ectopic pregnancy; History of prior pregnancy with IUGR newborn; Schizophrenia (HCC); Maternal varicella, non-immune; Bipolar affective (HCC); ASCUS of cervix with negative high risk HPV; Gestational diabetes; Impaired glucose tolerance test; and Positive GBS test on their problem list.  Patient reports no complaints.  Contractions: Not present. Vag. Bleeding: None.  Movement: Present. Denies leaking of fluid.   The following portions of the patient's history were reviewed and updated as appropriate: allergies, current medications, past family history, past medical history, past social history, past surgical history and problem list. Problem list updated.  Objective:   Vitals:   09/18/17 1128  BP: 138/69  Pulse: 63  Weight: 201 lb 9.6 oz (91.4 kg)    Fetal Status: Fetal Heart Rate (bpm): 135-150; doppler Fundal Height: 38 cm Movement: Present     General:  Alert, oriented and cooperative. Patient is in no acute distress.  Skin: Skin is warm and dry. No rash noted.   Cardiovascular: Normal heart rate noted  Respiratory: Normal respiratory effort, no problems with respiration noted  Abdomen: Soft, gravid, appropriate for gestational age.  Pain/Pressure: Present     Pelvic: Cervical exam deferred        Extremities: Normal range of motion.  Edema: Trace  Mental Status:  Normal mood and affect. Normal behavior. Normal judgment and thought content.   Assessment and Plan:  Pregnancy: [id number] at 1341w1d  1. Supervision of high risk pregnancy, antepartum     Not compliant with blood sugar testing.  Random CBG: 112.  2. Gestational diabetes mellitus (GDM) in third trimester, gestational diabetes method of control  unspecified     3. Positive GBS test     PCN for labor/delivery  Term labor symptoms and general obstetric precautions including but not limited to vaginal bleeding, contractions, leaking of fluid and fetal movement were reviewed in detail with the patient. Please refer to After Visit Summary for other counseling recommendations.  [facility] about 1 [facility] (around [facility]25/2017[facility] Ne[facility] to see FP MD here.   Roe Coombsachelle A Kennon Encinas, CNM

## 2017-09-25 ENCOUNTER — Ambulatory Visit (INDEPENDENT_AMBULATORY_CARE_PROVIDER_SITE_OTHER): Payer: Medicaid Other | Admitting: Certified Nurse Midwife

## 2017-09-25 ENCOUNTER — Encounter (HOSPITAL_COMMUNITY): Payer: Self-pay | Admitting: *Deleted

## 2017-09-25 ENCOUNTER — Encounter: Payer: Self-pay | Admitting: Certified Nurse Midwife

## 2017-09-25 ENCOUNTER — Other Ambulatory Visit: Payer: Self-pay

## 2017-09-25 ENCOUNTER — Telehealth (HOSPITAL_COMMUNITY): Payer: Self-pay | Admitting: *Deleted

## 2017-09-25 VITALS — BP 134/89 | HR 79

## 2017-09-25 DIAGNOSIS — B951 Streptococcus, group B, as the cause of diseases classified elsewhere: Secondary | ICD-10-CM

## 2017-09-25 DIAGNOSIS — Z283 Underimmunization status: Secondary | ICD-10-CM

## 2017-09-25 DIAGNOSIS — O0990 Supervision of high risk pregnancy, unspecified, unspecified trimester: Secondary | ICD-10-CM

## 2017-09-25 DIAGNOSIS — O09899 Supervision of other high risk pregnancies, unspecified trimester: Secondary | ICD-10-CM

## 2017-09-25 DIAGNOSIS — O24410 Gestational diabetes mellitus in pregnancy, diet controlled: Secondary | ICD-10-CM

## 2017-09-25 NOTE — Progress Notes (Signed)
C/o lower abdominal pain 

## 2017-09-25 NOTE — Progress Notes (Signed)
   PRENATAL VISIT NOTE  Subjective:  Sandra Anderson is a 27 y.o. G5P3013 at 2316w1d being seen today for ongoing prenatal care.  She is currently monitored for the following issues for this high-risk pregnancy and has Supervision of high risk pregnancy, antepartum; History of ectopic pregnancy; History of prior pregnancy with IUGR newborn; Schizophrenia (HCC); Maternal varicella, non-immune; Bipolar affective (HCC); ASCUS of cervix with negative high risk HPV; Gestational diabetes; Impaired glucose tolerance test; and Positive GBS test on their problem list.  Patient reports no bleeding, no leaking and occasional contractions.  Contractions: Irritability. Vag. Bleeding: None.  Movement: Present. Denies leaking of fluid.   The following portions of the patient's history were reviewed and updated as appropriate: allergies, current medications, past family history, past medical history, past social history, past surgical history and problem list. Problem list updated.  Objective:   Vitals:   09/25/17 0915 09/25/17 0917  BP: (!) 135/92 134/89  Pulse: 70 79    Fetal Status: Fetal Heart Rate (bpm): 130-146; doppler Fundal Height: 38 cm Movement: Present  Presentation: Vertex  General:  Alert, oriented and cooperative. Patient is in no acute distress.  Skin: Skin is warm and dry. No rash noted.   Cardiovascular: Normal heart rate noted  Respiratory: Normal respiratory effort, no problems with respiration noted  Abdomen: Soft, gravid, appropriate for gestational age.  Pain/Pressure: Present     Pelvic: Cervical exam performed Dilation: 1.5 Effacement (%): 0 Station: -3  Extremities: Normal range of motion.  Edema: Trace  Mental Status:  Normal mood and affect. Normal behavior. Normal judgment and thought content.   Assessment and Plan:  Pregnancy: [id number] at 5716w1d  1. Supervision of high risk pregnancy, antepartum     IOL scheduled for Sunday march 10th, 2019.  Orders placed. Random blood  sugar: 81, states fasting this morning. Letter written for work.    2. Diet controlled gestational diabetes mellitus (GDM) in third trimester     Has not been checking her blood sugars.   3. Positive GBS test     PCN for labor/delivery  4. Maternal varicella, non-immune     Varicella postpartum  Term labor symptoms and general obstetric precautions including but not limited to vaginal bleeding, contractions, leaking of fluid and fetal movement were reviewed in detail with the patient. Please refer to After Visit Summary for other counseling recommendations.  Return in about 4 weeks (around 10/23/2017) for Postpartum.   Roe Coombsachelle A Amairani Anderson, CNM

## 2017-09-25 NOTE — Telephone Encounter (Signed)
Preadmission screen  

## 2017-09-26 ENCOUNTER — Other Ambulatory Visit: Payer: Self-pay | Admitting: Advanced Practice Midwife

## 2017-10-01 ENCOUNTER — Encounter (HOSPITAL_COMMUNITY): Payer: Self-pay

## 2017-10-01 ENCOUNTER — Inpatient Hospital Stay (HOSPITAL_COMMUNITY)
Admission: RE | Admit: 2017-10-01 | Discharge: 2017-10-03 | DRG: 807 | Disposition: A | Discharge status: Home / Self Care | Payer: Medicaid Other | Source: Ambulatory Visit | Attending: Obstetrics and Gynecology | Admitting: Obstetrics and Gynecology

## 2017-10-01 DIAGNOSIS — O24429 Gestational diabetes mellitus in childbirth, unspecified control: Secondary | ICD-10-CM | POA: Diagnosis not present

## 2017-10-01 DIAGNOSIS — O0990 Supervision of high risk pregnancy, unspecified, unspecified trimester: Secondary | ICD-10-CM

## 2017-10-01 DIAGNOSIS — Z283 Underimmunization status: Secondary | ICD-10-CM

## 2017-10-01 DIAGNOSIS — O24419 Gestational diabetes mellitus in pregnancy, unspecified control: Secondary | ICD-10-CM | POA: Diagnosis present

## 2017-10-01 DIAGNOSIS — O99824 Streptococcus B carrier state complicating childbirth: Secondary | ICD-10-CM | POA: Diagnosis present

## 2017-10-01 DIAGNOSIS — Z87891 Personal history of nicotine dependence: Secondary | ICD-10-CM | POA: Diagnosis not present

## 2017-10-01 DIAGNOSIS — Z3A40 40 weeks gestation of pregnancy: Secondary | ICD-10-CM

## 2017-10-01 DIAGNOSIS — O24420 Gestational diabetes mellitus in childbirth, diet controlled: Secondary | ICD-10-CM | POA: Diagnosis present

## 2017-10-01 DIAGNOSIS — R87610 Atypical squamous cells of undetermined significance on cytologic smear of cervix (ASC-US): Secondary | ICD-10-CM

## 2017-10-01 DIAGNOSIS — O09899 Supervision of other high risk pregnancies, unspecified trimester: Secondary | ICD-10-CM

## 2017-10-01 DIAGNOSIS — F319 Bipolar disorder, unspecified: Secondary | ICD-10-CM

## 2017-10-01 LAB — COMPREHENSIVE METABOLIC PANEL
ALT: 12 U/L — ABNORMAL LOW (ref 14–54)
AST: 21 U/L (ref 15–41)
Albumin: 3 g/dL — ABNORMAL LOW (ref 3.5–5.0)
Alkaline Phosphatase: 174 U/L — ABNORMAL HIGH (ref 38–126)
Anion gap: 10 (ref 5–15)
BILIRUBIN TOTAL: 0.7 mg/dL (ref 0.3–1.2)
BUN: 7 mg/dL (ref 6–20)
CO2: 18 mmol/L — ABNORMAL LOW (ref 22–32)
CREATININE: 0.58 mg/dL (ref 0.44–1.00)
Calcium: 8.8 mg/dL — ABNORMAL LOW (ref 8.9–10.3)
Chloride: 105 mmol/L (ref 101–111)
Glucose, Bld: 157 mg/dL — ABNORMAL HIGH (ref 65–99)
POTASSIUM: 4.1 mmol/L (ref 3.5–5.1)
Sodium: 133 mmol/L — ABNORMAL LOW (ref 135–145)
TOTAL PROTEIN: 6.5 g/dL (ref 6.5–8.1)

## 2017-10-01 LAB — TYPE AND SCREEN
ABO/RH(D): O POS
ANTIBODY SCREEN: NEGATIVE

## 2017-10-01 LAB — GLUCOSE, CAPILLARY
GLUCOSE-CAPILLARY: 118 mg/dL — AB (ref 65–99)
GLUCOSE-CAPILLARY: 89 mg/dL (ref 65–99)
Glucose-Capillary: 106 mg/dL — ABNORMAL HIGH (ref 65–99)

## 2017-10-01 LAB — CBC
HEMATOCRIT: 38.3 % (ref 36.0–46.0)
Hemoglobin: 12.7 g/dL (ref 12.0–15.0)
MCH: 27.4 pg (ref 26.0–34.0)
MCHC: 33.2 g/dL (ref 30.0–36.0)
MCV: 82.7 fL (ref 78.0–100.0)
Platelets: 255 10*3/uL (ref 150–400)
RBC: 4.63 MIL/uL (ref 3.87–5.11)
RDW: 14.7 % (ref 11.5–15.5)
WBC: 6.8 10*3/uL (ref 4.0–10.5)

## 2017-10-01 MED ORDER — OXYCODONE-ACETAMINOPHEN 5-325 MG PO TABS: 2.0000 | ORAL_TABLET | ORAL | Status: DC | PRN

## 2017-10-01 MED ORDER — PRENATAL MULTIVITAMIN CH
1.0000 | ORAL_TABLET | Freq: Every day | ORAL | Status: DC
  Administered 2017-10-02 – 2017-10-03 (×2): 1 via ORAL
  Filled 2017-10-01 (×2): qty 1

## 2017-10-01 MED ORDER — OXYCODONE HCL 5 MG PO TABS: 5.0000 mg | ORAL_TABLET | ORAL | Status: DC | PRN

## 2017-10-01 MED ORDER — ACETAMINOPHEN 325 MG PO TABS: 650.0000 mg | ORAL_TABLET | ORAL | Status: DC | PRN

## 2017-10-01 MED ORDER — EPHEDRINE 5 MG/ML INJ
10.0000 mg | INTRAVENOUS | Status: DC | PRN
  Filled 2017-10-01: qty 2

## 2017-10-01 MED ORDER — SENNOSIDES-DOCUSATE SODIUM 8.6-50 MG PO TABS
2.0000 | ORAL_TABLET | ORAL | Status: DC
  Administered 2017-10-02 – 2017-10-03 (×2): 2 via ORAL
  Filled 2017-10-01 (×2): qty 2

## 2017-10-01 MED ORDER — ZOLPIDEM TARTRATE 5 MG PO TABS: 5.0000 mg | ORAL_TABLET | Freq: Every evening | ORAL | Status: DC | PRN

## 2017-10-01 MED ORDER — CEFAZOLIN SODIUM-DEXTROSE 1-4 GM/50ML-% IV SOLN: 1.0000 g | Freq: Three times a day (TID) | INTRAVENOUS | Status: DC

## 2017-10-01 MED ORDER — PHENYLEPHRINE 40 MCG/ML (10ML) SYRINGE FOR IV PUSH (FOR BLOOD PRESSURE SUPPORT)
80.0000 ug | PREFILLED_SYRINGE | INTRAVENOUS | Status: DC | PRN
  Filled 2017-10-01: qty 10
  Filled 2017-10-01: qty 5

## 2017-10-01 MED ORDER — MISOPROSTOL 200 MCG PO TABS
ORAL_TABLET | ORAL | Status: AC
  Filled 2017-10-01: qty 4

## 2017-10-01 MED ORDER — DIPHENHYDRAMINE HCL 50 MG/ML IJ SOLN: 12.5000 mg | INTRAMUSCULAR | Status: DC | PRN

## 2017-10-01 MED ORDER — OXYTOCIN 40 UNITS IN LACTATED RINGERS INFUSION - SIMPLE MED: 2.5000 [IU]/h | INTRAVENOUS | Status: DC

## 2017-10-01 MED ORDER — ONDANSETRON HCL 4 MG PO TABS: 4.0000 mg | ORAL_TABLET | ORAL | Status: DC | PRN

## 2017-10-01 MED ORDER — SIMETHICONE 80 MG PO CHEW: 80.0000 mg | CHEWABLE_TABLET | ORAL | Status: DC | PRN

## 2017-10-01 MED ORDER — TERBUTALINE SULFATE 1 MG/ML IJ SOLN
0.2500 mg | Freq: Once | INTRAMUSCULAR | Status: DC | PRN
  Filled 2017-10-01: qty 1

## 2017-10-01 MED ORDER — ONDANSETRON HCL 4 MG/2ML IJ SOLN: 4.0000 mg | INTRAMUSCULAR | Status: DC | PRN

## 2017-10-01 MED ORDER — OXYTOCIN 10 UNIT/ML IJ SOLN: 10.0000 [IU] | Freq: Once | INTRAMUSCULAR | Status: DC

## 2017-10-01 MED ORDER — ONDANSETRON HCL 4 MG/2ML IJ SOLN: 4.0000 mg | Freq: Four times a day (QID) | INTRAMUSCULAR | Status: DC | PRN

## 2017-10-01 MED ORDER — PENICILLIN G POT IN DEXTROSE 60000 UNIT/ML IV SOLN
3.0000 10*6.[IU] | INTRAVENOUS | Status: DC
  Administered 2017-10-01 (×2): 3 10*6.[IU] via INTRAVENOUS
  Filled 2017-10-01 (×3): qty 50

## 2017-10-01 MED ORDER — FENTANYL 2.5 MCG/ML BUPIVACAINE 1/10 % EPIDURAL INFUSION (WH - ANES)
14.0000 mL/h | INTRAMUSCULAR | Status: DC | PRN
  Filled 2017-10-01: qty 100

## 2017-10-01 MED ORDER — WITCH HAZEL-GLYCERIN EX PADS: 1.0000 "application " | MEDICATED_PAD | CUTANEOUS | Status: DC | PRN

## 2017-10-01 MED ORDER — OXYTOCIN BOLUS FROM INFUSION
500.0000 mL | Freq: Once | INTRAVENOUS | Status: AC
  Administered 2017-10-01: 500 mL via INTRAVENOUS

## 2017-10-01 MED ORDER — BENZOCAINE-MENTHOL 20-0.5 % EX AERO: 1.0000 "application " | INHALATION_SPRAY | CUTANEOUS | Status: DC | PRN

## 2017-10-01 MED ORDER — DIBUCAINE 1 % RE OINT: 1.0000 "application " | TOPICAL_OINTMENT | RECTAL | Status: DC | PRN

## 2017-10-01 MED ORDER — CEFAZOLIN SODIUM-DEXTROSE 2-4 GM/100ML-% IV SOLN: 2.0000 g | Freq: Once | INTRAVENOUS | Status: DC

## 2017-10-01 MED ORDER — IBUPROFEN 600 MG PO TABS
600.0000 mg | ORAL_TABLET | Freq: Four times a day (QID) | ORAL | Status: DC
  Administered 2017-10-02 – 2017-10-03 (×7): 600 mg via ORAL
  Filled 2017-10-01 (×7): qty 1

## 2017-10-01 MED ORDER — LIDOCAINE HCL (PF) 1 % IJ SOLN
30.0000 mL | INTRAMUSCULAR | Status: DC | PRN
  Filled 2017-10-01: qty 30

## 2017-10-01 MED ORDER — PHENYLEPHRINE 40 MCG/ML (10ML) SYRINGE FOR IV PUSH (FOR BLOOD PRESSURE SUPPORT)
80.0000 ug | PREFILLED_SYRINGE | INTRAVENOUS | Status: DC | PRN
  Filled 2017-10-01: qty 5

## 2017-10-01 MED ORDER — SOD CITRATE-CITRIC ACID 500-334 MG/5ML PO SOLN: 30.0000 mL | ORAL | Status: DC | PRN

## 2017-10-01 MED ORDER — CEFAZOLIN SODIUM-DEXTROSE 2-4 GM/100ML-% IV SOLN
2.0000 g | Freq: Once | INTRAVENOUS | Status: AC
  Administered 2017-10-01: 2 g via INTRAVENOUS
  Filled 2017-10-01: qty 100

## 2017-10-01 MED ORDER — FLEET ENEMA 7-19 GM/118ML RE ENEM: 1.0000 | ENEMA | RECTAL | Status: DC | PRN

## 2017-10-01 MED ORDER — OXYCODONE-ACETAMINOPHEN 5-325 MG PO TABS
1.0000 | ORAL_TABLET | ORAL | Status: DC | PRN
  Administered 2017-10-01: 1 via ORAL
  Filled 2017-10-01: qty 1

## 2017-10-01 MED ORDER — LACTATED RINGERS IV SOLN: 500.0000 mL | INTRAVENOUS | Status: DC | PRN

## 2017-10-01 MED ORDER — SODIUM CHLORIDE 0.9 % IV SOLN
5.0000 10*6.[IU] | Freq: Once | INTRAVENOUS | Status: AC
  Administered 2017-10-01: 5 10*6.[IU] via INTRAVENOUS
  Filled 2017-10-01: qty 5

## 2017-10-01 MED ORDER — OXYTOCIN 40 UNITS IN LACTATED RINGERS INFUSION - SIMPLE MED
1.0000 m[IU]/min | INTRAVENOUS | Status: DC
  Administered 2017-10-01: 2 m[IU]/min via INTRAVENOUS
  Filled 2017-10-01: qty 1000

## 2017-10-01 MED ORDER — FENTANYL CITRATE (PF) 100 MCG/2ML IJ SOLN
100.0000 ug | INTRAMUSCULAR | Status: DC | PRN
  Administered 2017-10-01 (×2): 100 ug via INTRAVENOUS
  Filled 2017-10-01 (×2): qty 2

## 2017-10-01 MED ORDER — TETANUS-DIPHTH-ACELL PERTUSSIS 5-2.5-18.5 LF-MCG/0.5 IM SUSP: 0.5000 mL | Freq: Once | INTRAMUSCULAR | Status: DC

## 2017-10-01 MED ORDER — LACTATED RINGERS IV SOLN
INTRAVENOUS | Status: DC
  Administered 2017-10-01 (×2): via INTRAVENOUS

## 2017-10-01 MED ORDER — COCONUT OIL OIL: 1.0000 "application " | TOPICAL_OIL | Status: DC | PRN

## 2017-10-01 MED ORDER — MISOPROSTOL 200 MCG PO TABS
800.0000 ug | ORAL_TABLET | Freq: Once | ORAL | Status: AC
  Administered 2017-10-01: 800 ug via RECTAL

## 2017-10-01 MED ORDER — MISOPROSTOL 50MCG HALF TABLET
50.0000 ug | ORAL_TABLET | ORAL | Status: DC | PRN
  Administered 2017-10-01: 50 ug via BUCCAL
  Filled 2017-10-01: qty 1

## 2017-10-01 MED ORDER — DIPHENHYDRAMINE HCL 25 MG PO CAPS: 25.0000 mg | ORAL_CAPSULE | Freq: Four times a day (QID) | ORAL | Status: DC | PRN

## 2017-10-01 MED ORDER — LACTATED RINGERS IV SOLN
500.0000 mL | Freq: Once | INTRAVENOUS | Status: AC
  Administered 2017-10-01: 500 mL via INTRAVENOUS

## 2017-10-01 NOTE — Progress Notes (Signed)
Labor Progress Note Sandra Anderson is a 27 y.o. [id number] at 1175w0d presented for IOL for A1GDM.  S: Patient uncomfortable with contractions. No questions at this time.  O:  BP 132/84 (BP Location: Right Arm)   Pulse 73   Temp 97.7 F (36.5 C) (Oral)   Resp 18   Ht 5\' 7"  (1.702 m)   Wt 198 lb 8 oz (90 kg)   LMP 12/25/2016   BMI 31.09 kg/m   JYN:WGNFAOZHFHT:baseline rate 130, moderate variability, + acels, early decels Toco: ctx every 2 min  CVE: Dilation: 5 Effacement (%): 80 Cervical Position: Middle Station: -1 Presentation: Vertex Exam by:: Jaclyn PrimeK Wheatley, RN  A&P: 27 y.o. [id number] 3075w0d here for IOL for A1GDM. #Labor: Progressing well. Continue pitocin, titrating as needed. #Pain: planning for epidural #FWB: Cat I #GBS positive, on PCN #GDM: Last CBG wnl, continue to monitor  Ellwood DenseAlison Rumball, DO 8:26 PM

## 2017-10-01 NOTE — Progress Notes (Signed)
Labor Progress Note  Sandra Anderson is a 27 y.o. [id number] at 7112w0d  admitted for induction of labor due to Gestational diabetes.  S: Comfortable anLeilani Anderson resting in bed. Not feeling any contractions.   O:  BP 139/87   Pulse 74   Temp 97.8 F (36.6 C) (Oral)   Resp 18   Ht 5\' 7"  (1.702 m)   Wt 90 kg (198 lb 8 oz)   LMP 12/25/2016   BMI 31.09 kg/m   No intake/output data recorded.  FHT:  FHR: 135 bpm, variability: moderate,  accelerations:  Present,  decelerations:  Absent UC:   Irregular, 2-5 minutes SVE:   Dilation: 3.5 Effacement (%): 70 Station: -3 Exam by:: Dr Doroteo GlassmanPhelps  Labs: Lab Results  Component Value Date   WBC 6.8 10/01/2017   HGB 12.7 10/01/2017   HCT 38.3 10/01/2017   MCV 82.7 10/01/2017   PLT 255 10/01/2017    Assessment / Plan: 27 y.o. [id number] 7012w0d in early labor Induction of labor due to gestational diabetes  Labor: Progressing normally, s/p Cytotec x1. FB placed will likely not be in long. Will start Pitocin once it falls out.  GDM: CBGs have been within range. Fetal Wellbeing:  Category I Pain Control:  Labor support without medications Anticipated MOD:  NSVD  Expectant management   Sandra AdaJazma Phelps, DO OB Fellow [facility], [facility]

## 2017-10-01 NOTE — Progress Notes (Signed)
Pt was lying in bed and awake when I arrived. She said she was nervous, but could not pinpoint her sources of nervousness. She admitted that being alone did not help. When asked about family/support she said her boyfriend is on the way; her mother is deceased and her brothers live in [place]. She said she really does not have support. She talked a little about her other children saying she does not have custody of one, another lives w/his gmother in [place] and another is presently w/its aunt. Pt's SO did not arrive during our visit. She said her baby's daddy's parents own a church and she wants to get in church w/her children. She said prayer is helpful. After prayer she was grateful and seemed a little more at ease - even smiling during the remainder of our visit. I offered continued support and assured her she does not have to go through her delivery alone. Please page if SO does not show up or if she does not have the support when she needs it. [phone] Chaplain Marjory Liesamela Quinn Bartling Holder, MDiv   10/01/17 1500  Clinical Encounter Type  Visited With Patient

## 2017-10-01 NOTE — H&P (Signed)
Obstetric History and Physical  Sandra Anderson is a 27 y.o. [id number] with IUP at 21w0dpresenting for IOL for A1GDM. Patient reports she is not on any medications. Has not checked her blood sugars in over a month. Has not been following a diabetic diet. Patient states she has been having  none contractions, none vaginal bleeding, intact membranes, with active fetal movement.    Prenatal Course Source of Care: [facility] with onset of care at 9 weeks Dating: By LMP --->  Estimated Date of Delivery: 36/62/94Pregnancy complications or risks: Patient Active Problem List   Diagnosis Date Noted  . Gestational diabetes mellitus (GDM) affecting pregnancy 10/01/2017  . Positive GBS test 09/18/2017  . Impaired glucose tolerance test 08/15/2017  . Gestational diabetes 07/20/2017  . Schizophrenia ([place] 06/21/2017  . Maternal varicella, non-immune 06/21/2017  . Bipolar affective ([place] 06/21/2017  . ASCUS of cervix with negative high risk HPV 06/21/2017  . Supervision of high risk pregnancy, antepartum 03/03/2017  . History of ectopic pregnancy 03/03/2017  . History of prior pregnancy with IUGR newborn 03/03/2017   She plans to bottle feed She desires Depo-Provera for postpartum contraception.   Sono:    _0 , CWD, normal anatomy, cephalic presentation, anterior placenta, 2929g, 68% EFW, normal AFI  Prenatal labs and studies: ABO, Rh: O/Positive/-- (08/10 1042) Antibody: Negative (08/10 1042) Rubella: 3.07 (08/10 1042) RPR: Non Reactive (12/14 1115)  HBsAg: Negative (08/10 1042)  HIV: Non Reactive (12/14 1115)  GTML:YYTKPTWS(02/18 1108) 2 hr Glucola  abnormal Genetic screening normal Anatomy UKoreanormal  Prenatal Transfer Tool  Maternal Diabetes: Yes:  Diabetes Type:  Diet controlled Genetic Screening: Normal Maternal Ultrasounds/Referrals: Normal Fetal Ultrasounds or other Referrals:  None Maternal Substance Abuse:  No Significant Maternal Medications:  None Significant Maternal Lab  Results: Lab values include: Group B Strep positive  Past Medical History:  Diagnosis Date  . Chlamydia contact, treated   . Depression    pp depression after first and second children no counseling or meds  . Gestational diabetes 08/03/2017   diet controlled    Past Surgical History:  Procedure Laterality Date  . MOUTH SURGERY    . NO PAST SURGERIES      OB History  Gravida Para Term Preterm AB Living  _1 0 1 3  SAB TAB Ectopic Multiple Live Births  0 0 1   3    # Outcome Date GA Lbr Len/2nd Weight Sex Delivery Anes PTL Lv  5 Current           4 Ectopic 05/24/16 655w3d       3 Term 03/19/15 3930w2d:07 / 00:04 3.395 kg (7 lb 7.8 oz) F Vag-Spont EPI  LIV     Birth Comments: WNL  2 Term 05/04/11 38w49w1d25 / 00:11 3.09 kg (6 lb 13 oz) M Vag-Spont EPI  LIV  1 Term 05/12/10 40w03w0dVag-Spont None N LIV      Social History   Socioeconomic History  . Marital status: Single    Spouse name: None  . Number of children: None  . Years of education: None  . Highest education level: None  Social Needs  . Financial resource strain: None  . Food insecurity - worry: None  . Food insecurity - inability: None  . Transportation needs - medical: None  . Transportation needs - non-medical: None  Occupational History  . None  Tobacco Use  . Smoking status: Former Smoker  Packs/day: 0.25    Years: 2.00    Pack years: 0.50    Types: Cigarettes    Last attempt to quit: 08/03/2017    Years since quitting: 0.1  . Smokeless tobacco: Former Network engineer and Sexual Activity  . Alcohol use: No    Frequency: Never  . Drug use: No  . Sexual activity: Yes    Birth control/protection: None  Other Topics Concern  . None  Social History Narrative   ** Merged History Encounter **        Family History  Problem Relation Age of Onset  . Hypertension Mother   . Cirrhosis Mother   . Alcohol abuse Mother     Medications Prior to Admission  Medication Sig Dispense Refill  Last Dose  . Blood Glucose Monitoring Suppl (ACCU-CHEK GUIDE) w/Device KIT 1 each by Does not apply route as directed. 1 kit 0 Taking  . Doxylamine-Pyridoxine (DICLEGIS) 10-10 MG TBEC Take 1 tablet by mouth 3 (three) times daily. 1 tab in AM, 1 tab mid afternoon 2 tabs at bedtime. Max dose 4 tabs daily. (Patient not taking: Reported on 09/25/2017) 100 tablet prn Not Taking  . glucose blood (ACCU-CHEK GUIDE) test strip Use as instructed 100 each 12 Taking  . Lancets (ACCU-CHEK MULTICLIX) lancets Use as instructed 100 each 12 Taking  . Prenat-FeAsp-Meth-FA-DHA w/o A (PRENATE PIXIE) 10-0.6-0.4-200 MG CAPS Take 1 tablet by mouth daily. 30 capsule 12 Taking  . Prenatal MV-Min-FA-Omega-3 (PRENATAL GUMMIES/DHA & FA PO) Take by mouth.   Taking  . Prenatal Vit-Fe Fumarate-FA (PRENATAL MULTIVITAMIN) TABS tablet Take 1 tablet by mouth daily at 12 noon.   Taking  . Prenatal Vit-Fe Fumarate-FA (PRENATAL VITAMIN) 27-0.8 MG TABS Take 1 tablet by mouth daily. 90 tablet 5 Taking    No Known Allergies  Review of Systems: Negative except for what is mentioned in HPI.  Physical Exam: Temp 97.8 F (36.6 C) (Oral)   Ht _0  (1.702 m)   Wt 90 kg (198 lb 8 oz)   LMP 12/25/2016   BMI 31.09 kg/m  CONSTITUTIONAL: Well-developed, well-nourished female in no acute distress.  HENT:  Normocephalic, atraumatic, External right and left ear normal. Oropharynx is clear and moist EYES: Conjunctivae and EOM are normal. Pupils are equal, round, and reactive to light. No scleral icterus.  NECK: Normal range of motion, supple, no masses SKIN: Skin is warm and dry. No rash noted. Not diaphoretic. No erythema. No pallor. NEUROLOGIC: Alert and oriented to person, place, and time. Normal reflexes, muscle tone coordination. No cranial nerve deficit noted. PSYCHIATRIC: Normal mood and affect. Normal behavior. Normal judgment and thought content. CARDIOVASCULAR: Normal heart rate noted, regular rhythm RESPIRATORY: Effort and breath  sounds normal, no problems with respiration noted ABDOMEN: Soft, nontender, nondistended, gravid. MUSCULOSKELETAL: Normal range of motion. No edema and no tenderness. 2+ distal pulses.  Cervical Exam: Dilation: 1 Effacement (%): 50 Station: -3 Presentation: Vertex Exam by:: Dr. Gerarda Fraction  FHT:  Baseline rate 135 bpm   Variability moderate  Accelerations present   Decelerations none Contractions: Occasional   Pertinent Labs/Studies:   Results for orders placed or performed during the hospital encounter of 10/01/17 (from the past 24 hour(s))  CBC     Status: None   Collection Time: 10/01/17 10:58 AM  Result Value Ref Range   WBC 6.8 4.0 - 10.5 K/uL   RBC 4.63 3.87 - 5.11 MIL/uL   Hemoglobin 12.7 12.0 - 15.0 g/dL   HCT 38.3 36.0 -  46.0 %   MCV 82.7 78.0 - 100.0 fL   MCH 27.4 26.0 - 34.0 pg   MCHC 33.2 30.0 - 36.0 g/dL   RDW 14.7 11.5 - 15.5 %   Platelets 255 150 - 400 K/uL    Assessment : Sandra Anderson is a 27 y.o. [id number] at 78w0dbeing admitted for induction of labor due to GDM.  Plan: Labor: Induction with Cytotec.   Analgesia as needed. Plans on getting epidural. GDM: Will collect CBG now and then q4hrs. Patient informed of carb modified diet. Discussed if elevated CBGs need to start insulin. FWB: Reassuring fetal heart tracing.   GBS positive - start PCN Delivery plan: Hopeful for vaginal delivery  Will need CSW consult s/p delivery due to mental health history of schizophrenia and bipolar.   JLuiz Blare DO OB Fellow Faculty Practice, [place]/04/2018, 11:43 AM

## 2017-10-02 ENCOUNTER — Other Ambulatory Visit: Payer: Self-pay

## 2017-10-02 DIAGNOSIS — O24429 Gestational diabetes mellitus in childbirth, unspecified control: Secondary | ICD-10-CM

## 2017-10-02 DIAGNOSIS — Z3A40 40 weeks gestation of pregnancy: Secondary | ICD-10-CM

## 2017-10-02 DIAGNOSIS — O99824 Streptococcus B carrier state complicating childbirth: Secondary | ICD-10-CM

## 2017-10-02 LAB — RPR: RPR Ser Ql: NONREACTIVE

## 2017-10-02 LAB — GLUCOSE, CAPILLARY: GLUCOSE-CAPILLARY: 90 mg/dL (ref 65–99)

## 2017-10-02 MED ORDER — IBUPROFEN 600 MG PO TABS: 600.0000 mg | ORAL_TABLET | Freq: Four times a day (QID) | ORAL | 0 refills | Status: DC

## 2017-10-02 NOTE — Discharge Summary (Signed)
OB Discharge Summary    Patient Name: Sandra Anderson DOB: 1990/10/31 MRN: [id number] Date of admission: 10/01/2017  Delivering MD: Rory Percy )  Date of discharge: 10/02/2017  Admitting diagnosis: IOL for A1GDM Intrauterine pregnancy: [redacted]w[redacted]d   Secondary diagnosis:  Active Problems:   Patient Active Problem List   Diagnosis Date Noted  . SVD (spontaneous vaginal delivery) 10/02/2017  . Gestational diabetes mellitus (GDM) affecting pregnancy 10/01/2017  . Positive GBS test 09/18/2017  . Impaired glucose tolerance test 08/15/2017  . Gestational diabetes 07/20/2017  . Schizophrenia ([facility] 06/21/2017  . Maternal varicella, non-immune 06/21/2017  . Bipolar affective ([facility] 06/21/2017  . ASCUS of cervix with negative high risk HPV 06/21/2017  . Supervision of high risk pregnancy, antepartum 03/03/2017  . History of ectopic pregnancy 03/03/2017  . History of prior pregnancy with IUGR newborn 03/03/2017   Additional problems:      Discharge diagnosis: Term Pregnancy Delivered and GDM A1                                  Post partum procedures: none  Complications: moderate amount of bleeding noted immediately after delivery, 8075m rectal cytotec given, maunal sweep of lower uterine segment with good response.  Hospital course:  Induction of Labor With Vaginal Delivery   [age].o. yo [id number] 4010w0ds admitted to the hospital 10/01/2017 for induction of labor.  Indication for induction: A1 DM.  Patient had a labor course significant for moderate bleeding requiring additional cytotec, otherwise uncomplicated as follows: Membrane Rupture Time/Date: 8:53 PM ,10/01/2017   Intrapartum Procedures: Episiotomy: None [1]                                         Lacerations:  None [1]  Patient had delivery of a Viable infant.  Information for the patient's newborn:  MooBriahna, Pescador3[[id number]]elivery Method: Vaginal, Spontaneous(Filed from Delivery Summary)   10/01/2017  Details of  delivery can be found in separate delivery note.  Patient had a routine postpartum course. Mood remained stable throughout hospitalization, will need 1 week BH follow up visit. BP elevated postpartum and started on Norvasc 5mg40mill need 1 week BP check. Patient is discharged home 10/02/17.  Physical exam  Vitals:   10/02/17 1741 10/02/17 1947  BP: (!) 153/87 138/90  Pulse: 78 71  Resp: 18   Temp: 98.6 F (37 C)   SpO2:  99%   General: alert, cooperative and no distress Lochia: appropriate Uterine Fundus: firm Incision: N/A DVT Evaluation: No evidence of DVT seen on physical exam.  Labs: Results for orders placed or performed during the hospital encounter of 10/01/17 (from the past 24 hour(s))  Glucose, capillary     Status: None   Collection Time: 10/02/17  9:08 AM  Result Value Ref Range   Glucose-Capillary 90 65 - 99 mg/dL   Discharge instruction: per After Visit Summary and "Baby and Me Booklet".  After visit meds:  No Known Allergies  Allergies as of 10/02/2017   No Known Allergies     Medication List    STOP taking these medications   ACCU-CHEK GUIDE w/Device Kit   accu-chek multiclix lancets   glucose blood test strip Commonly known as:  ACCU-CHEK GUIDE     TAKE these medications   ibuprofen 600 MG tablet  Commonly known as:  ADVIL,MOTRIN Take 1 tablet (600 mg total) by mouth every 6 (six) hours. Start taking on:  10/03/2017      Diet: routine diet  Activity: Advance as tolerated. Pelvic rest for 6 weeks.   Outpatient follow up:4 weeks for postpartum, 1 week BH f/u and BP check Future Appointments:  Future Appointments  Date Time Provider [place]  10/10/2017  2:30 PM Kandis Cocking A, CNM [facility] None  10/31/2017  1:00 PM Denney, Rachelle A, CNM [facility] None    Follow up Appt: No Follow-up on file.  Postpartum contraception: Depo Provera  Newborn Data: APGAR (1 MIN): 8   APGAR (5 MINS): 9    Baby Feeding: Bottle Disposition:home  with mother  Rory Percy, DO 10/02/2017

## 2017-10-02 NOTE — Progress Notes (Signed)
Post Partum Day 1 Subjective: no complaints, up ad lib, voiding and tolerating PO  Objective: Blood pressure 127/87, pulse 69, temperature 98 F (36.7 C), temperature source Oral, resp. rate 18, height 5\' 7"  (1.702 m), weight 90 kg (198 lb 8 oz), last menstrual period 12/25/2016, SpO2 99 %, unknown if currently breastfeeding.  Physical Exam:  General: alert, cooperative and no distress Lochia: appropriate Uterine Fundus: firm DVT Evaluation: No evidence of DVT seen on physical exam. No significant calf/ankle edema.  Recent Labs    10/01/17 1058  HGB 12.7  HCT 38.3    Assessment/Plan: Plan for discharge tomorrow  Social work consult [facility] pending for this AM Continue routine postpartum care    LOS: 1 day   Sandra AdaJazma Anayah Arvanitis, DO 10/02/2017, 7:55 AM

## 2017-10-02 NOTE — Discharge Instructions (Signed)
Vaginal Delivery, Care After °Refer to this sheet in the next few weeks. These instructions provide you with information about caring for yourself after vaginal delivery. Your health care provider may also give you more specific instructions. Your treatment has been planned according to current medical practices, but problems sometimes occur. Call your health care provider if you have any problems or questions. °What can I expect after the procedure? °After vaginal delivery, it is common to have: °· Some bleeding from your vagina. °· Soreness in your abdomen, your vagina, and the area of skin between your vaginal opening and your anus (perineum). °· Pelvic cramps. °· Fatigue. ° °Follow these instructions at home: °Medicines °· Take over-the-counter and prescription medicines only as told by your health care provider. °· If you were prescribed an antibiotic medicine, take it as told by your health care provider. Do not stop taking the antibiotic until it is finished. °Driving ° °· Do not drive or operate heavy machinery while taking prescription pain medicine. °· Do not drive for 24 hours if you received a sedative. °Lifestyle °· Do not drink alcohol. This is especially important if you are breastfeeding or taking medicine to relieve pain. °· Do not use tobacco products, including cigarettes, chewing tobacco, or e-cigarettes. If you need help quitting, ask your health care provider. °Eating and drinking °· Drink at least 8 eight-ounce glasses of water every day unless you are told not to by your health care provider. If you choose to breastfeed your baby, you may need to drink more water than this. °· Eat high-fiber foods every day. These foods may help prevent or relieve constipation. High-fiber foods include: °? Whole grain cereals and breads. °? Brown rice. °? Beans. °? Fresh fruits and vegetables. °Activity °· Return to your normal activities as told by your health care provider. Ask your health care provider  what activities are safe for you. °· Rest as much as possible. Try to rest or take a nap when your baby is sleeping. °· Do not lift anything that is heavier than your baby or 10 lb (4.5 kg) until your health care provider says that it is safe. °· Talk with your health care provider about when you can engage in sexual activity. This may depend on your: °? Risk of infection. °? Rate of healing. °? Comfort and desire to engage in sexual activity. °Vaginal Care °· If you have an episiotomy or a vaginal tear, check the area every day for signs of infection. Check for: °? More redness, swelling, or pain. °? More fluid or blood. °? Warmth. °? Pus or a bad smell. °· Do not use tampons or douches until your health care provider says this is safe. °· Watch for any blood clots that may pass from your vagina. These may look like clumps of dark red, brown, or black discharge. °General instructions °· Keep your perineum clean and dry as told by your health care provider. °· Wear loose, comfortable clothing. °· Wipe from front to back when you use the toilet. °· Ask your health care provider if you can shower or take a bath. If you had an episiotomy or a perineal tear during labor and delivery, your health care provider may tell you not to take baths for a certain length of time. °· Wear a bra that supports your breasts and fits you well. °· If possible, have someone help you with household activities and help care for your baby for at least a few days after   you leave the hospital. °· Keep all follow-up visits for you and your baby as told by your health care provider. This is important. °Contact a health care provider if: °· You have: °? Vaginal discharge that has a bad smell. °? Difficulty urinating. °? Pain when urinating. °? A sudden increase or decrease in the frequency of your bowel movements. °? More redness, swelling, or pain around your episiotomy or vaginal tear. °? More fluid or blood coming from your episiotomy or  vaginal tear. °? Pus or a bad smell coming from your episiotomy or vaginal tear. °? A fever. °? A rash. °? Little or no interest in activities you used to enjoy. °? Questions about caring for yourself or your baby. °· Your episiotomy or vaginal tear feels warm to the touch. °· Your episiotomy or vaginal tear is separating or does not appear to be healing. °· Your breasts are painful, hard, or turn red. °· You feel unusually sad or worried. °· You feel nauseous or you vomit. °· You pass large blood clots from your vagina. If you pass a blood clot from your vagina, save it to show to your health care provider. Do not flush blood clots down the toilet without having your health care provider look at them. °· You urinate more than usual. °· You are dizzy or light-headed. °· You have not breastfed at all and you have not had a menstrual period for 12 weeks after delivery. °· You have stopped breastfeeding and you have not had a menstrual period for 12 weeks after you stopped breastfeeding. °Get help right away if: °· You have: °? Pain that does not go away or does not get better with medicine. °? Chest pain. °? Difficulty breathing. °? Blurred vision or spots in your vision. °? Thoughts about hurting yourself or your baby. °· You develop pain in your abdomen or in one of your legs. °· You develop a severe headache. °· You faint. °· You bleed from your vagina so much that you fill two sanitary pads in one hour. °This information is not intended to replace advice given to you by your health care provider. Make sure you discuss any questions you have with your health care provider. °Document Released: 07/08/2000 Document Revised: 12/23/2015 Document Reviewed: 07/26/2015 °Elsevier Interactive Patient Education © 2018 Elsevier Inc. ° °

## 2017-10-03 ENCOUNTER — Ambulatory Visit: Payer: Medicaid Other | Admitting: Certified Nurse Midwife

## 2017-10-03 MED ORDER — AMLODIPINE BESYLATE 5 MG PO TABS: 5.0000 mg | ORAL_TABLET | Freq: Every day | ORAL | 1 refills | Status: DC

## 2017-10-03 MED ORDER — AMLODIPINE BESYLATE 5 MG PO TABS
5.0000 mg | ORAL_TABLET | Freq: Every day | ORAL | Status: DC
  Administered 2017-10-03: 5 mg via ORAL
  Filled 2017-10-03 (×2): qty 1

## 2017-10-03 NOTE — Clinical Social Work Maternal (Signed)
CLINICAL SOCIAL WORK MATERNAL/CHILD NOTE  Patient Details  Name: Sandra Anderson MRN: [id number] Date of Birth: 03-Nov-1990  Date:  10/03/2017  Clinical Social Worker Initiating Note:  Terri Piedra, [place] Date/Time: Initiated:  10/03/17/0930     Child's Name:  Sandra Anderson   Biological Parents:  Mother, Father(Sandra Anderson and Sandra Anderson)   Need for Interpreter:  None   Reason for Referral:  Behavioral Health Concerns, Other (Comment)(Hx of CPS involvement resulting in loss of custody of her second child.)   Address:  [address] [place] [address]    Phone number:  [phone] (home)     Additional phone number:  Household Members/Support Persons (HM/SP):   Household Member/Support Person 1, Household Member/Support Person 2   HM/SP Name Relationship DOB or Age  HM/SP -1 Sandra Anderson FOB/Significant Other    HM/SP -2 Sandra Anderson daughter 03/19/15  HM/SP -3        HM/SP -4        HM/SP -5        HM/SP -6        HM/SP -7        HM/SP -8          Natural Supports (not living in the home):  Extended Family(MOB reports that FOB's family is involved and supportive.)   Professional Supports: None(MOB has received services at [facility] in the past and is willing to return if needed.)   Employment:     Type of Work: MOB plans to seek employment after a period of time home with baby.  FOB works at [employer] in the [employer].   Education:      Homebound arranged:    Financial Resources:  Medicaid   Other Resources:      Cultural/Religious Considerations Which May Impact Care: None stated.  MOB's facesheet notes religion as Non-Denominational.   Strengths:  Ability to meet basic needs , Home prepared for child , Pediatrician chosen   Psychotropic Medications:         Pediatrician:    [place]  Pediatrician List:   [facility]      Pediatrician Fax Number:    Risk Factors/Current Problems:  Mental Health Concerns (MOB has hx of being diagnoses with Bipolar and Schizophenia after the death of her mother in 19-Sep-2011.)   Cognitive State:  Able to Concentrate , Alert , Linear Thinking , Insightful , Goal Oriented    Mood/Affect:  Calm , Comfortable , Interested , Euthymic    CSW Assessment: CSW met with MOB in her first floor room to offer support and complete assessment due to mental health concerns and lack of custody of other children.  MOB was lying awake in bed watching TV when CSW entered.  Baby was asleep in bassinet.  She was pleasant and welcoming of CSW's visit.  CSW found MOB to be easy to engage.   MOB reports that labor and delivery did not go well because she was too far along when she got check to get an epidural, which she had planned on.  She states she is recovering well, however.  MOB states this is her second daughter and child with current FOB.  They have a 85 year old at home/Sandra Anderson, who is currently at daycare.  She states her boyfriend is involved  and supportive and that they live together.  He works at [employer] where their daughter is cared for and where baby will be cared for when MOB decides to seek employment.  She states she does not know when this will be, and plans to give herself some time at home to bond with baby.  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   MOB seemed very open about her two older children and states that her oldest son, Sandra Anderson/7 lives with his PGM.  She states they live in [place] and she sees him all the time.  She states she has a good relationship with his grandmother and that his father is minimally involved.  She states she was young at the time she had him and that his grandmother helped support them.  She states they moved to [place] and MOB was okay with him going  with his grandmother as they are in contact frequently and she needed the assistance.  She reports that she lost custody of her second child, Sandra Anderson/5 a few years ago because she trusted his godmother to care for him and it was learned that his godmother was abusing him.  She states she was forced to sign over her parental rights to this child and he was adopted.  She states she has not received counseling for this and that she wanted to retaliate, but she "gave it to God" instead.  CSW has read documentation from CSW/C. Bevel that a report was made when MOB's daughter was born in 2014/09/13 regarding the loss of custody of this child, but the report was not accepted.  Therefore, CSW does not feel a report needs to made at this time. CSW spoke at length about MOB's mental health and provided education regarding PMADs.  MOB states that she was diagnosed with Bipolar and Schizophrenia after her mother died in 2011-09-14.  She states she was struggling so much with her death that she went to [facility], and this is where she was diagnosed and put on medication.  She states she was experiencing hallucinations, mostly auditory, and feeling very paranoid.  She states the medication made her feel worse, she she stopped taking it.  She reports that she has not had any of these symptoms in years.  CSW feels her symptoms may have been a severe grief reaction from MOB's account today.  She states she is not interested in counseling at this time, although acknowledges the emotional stress that has come from losing custody of one of her children and the death of her mother.  She states a commitment to return to [facility] if signs and symptoms of PMADs arise.  She states she experienced PPD after her sons, but identifies a major difference in her support system at those times given that she was "on my own."  She reports that the support of her current boyfriend is a huge difference and a huge positive in her life. CSW  identifies no barriers to discharge and informed staff.  CSW Plan/Description:  No Further Intervention Required/No Barriers to Discharge, Sudden Infant Death Syndrome (SIDS) Education, Perinatal Mood and Anxiety Disorder (PMADs) Education, Other Information/Referral to [place], Farmer, White 10/03/2017, 11:04 AM

## 2017-10-10 ENCOUNTER — Ambulatory Visit: Payer: Medicaid Other | Admitting: Certified Nurse Midwife

## 2017-10-11 ENCOUNTER — Telehealth: Payer: Self-pay | Admitting: *Deleted

## 2017-10-11 NOTE — Telephone Encounter (Signed)
Lft Voicemail for Pt to callback and reschedule Missed appt.Marland Kitchen..Marland Kitchen

## 2017-10-18 ENCOUNTER — Ambulatory Visit: Payer: Medicaid Other | Admitting: Certified Nurse Midwife

## 2017-10-18 ENCOUNTER — Telehealth: Payer: Self-pay | Admitting: *Deleted

## 2017-10-18 NOTE — Telephone Encounter (Signed)
Pt  missed appt on 10/10/2017 rescheduled for today on 10/18/2017 and patient no showed today's visit.Marland Kitchen..Marland Kitchen

## 2017-10-20 ENCOUNTER — Telehealth: Payer: Self-pay | Admitting: *Deleted

## 2017-10-24 NOTE — Telephone Encounter (Signed)
ERROR

## 2017-10-30 ENCOUNTER — Ambulatory Visit: Payer: Medicaid Other | Admitting: Certified Nurse Midwife

## 2017-10-31 ENCOUNTER — Ambulatory Visit: Payer: Medicaid Other | Admitting: Certified Nurse Midwife

## 2017-11-01 ENCOUNTER — Telehealth: Payer: Self-pay | Admitting: *Deleted

## 2017-11-01 NOTE — Telephone Encounter (Signed)
Lft vmail for patient to callback and reschedule missed PP visit.Marland Kitchen.Marland Kitchen..Marland Kitchen

## 2018-01-20 ENCOUNTER — Ambulatory Visit (HOSPITAL_COMMUNITY)
Admission: EM | Admit: 2018-01-20 | Discharge: 2018-01-20 | Disposition: A | Discharge status: Home / Self Care | Payer: Medicaid Other | Attending: Family Medicine | Admitting: Family Medicine

## 2018-01-20 ENCOUNTER — Encounter (HOSPITAL_COMMUNITY): Payer: Self-pay | Admitting: *Deleted

## 2018-01-20 ENCOUNTER — Other Ambulatory Visit: Payer: Self-pay

## 2018-01-20 DIAGNOSIS — H10501 Unspecified blepharoconjunctivitis, right eye: Secondary | ICD-10-CM

## 2018-01-20 MED ORDER — TOBRAMYCIN 0.3 % OP SOLN: 1.0000 [drp] | Freq: Four times a day (QID) | OPHTHALMIC | 0 refills | Status: DC

## 2018-01-20 NOTE — ED Provider Notes (Signed)
[facility]   [id number] 01/20/18 Arrival Time: 1809   SUBJECTIVE:  Leilani Ablemillia Aaberg is a 27 y.o. female who presents to the urgent care with complaint of right eye redness, drainage and crusting x 2 days.  Denies any vision changes.  Past Medical History:  Diagnosis Date  . Chlamydia contact, treated   . Depression    pp depression after first and second children no counseling or meds  . Gestational diabetes 08/03/2017   diet controlled   Family History  Problem Relation Age of Onset  . Hypertension Mother   . Cirrhosis Mother   . Alcohol abuse Mother    Social History   Socioeconomic History  . Marital status: Single    Spouse name: Not on file  . Number of children: Not on file  . Years of education: Not on file  . Highest education level: Not on file  Occupational History  . Not on file  Social Needs  . Financial resource strain: Not on file  . Food insecurity:    Worry: Not on file    Inability: Not on file  . Transportation needs:    Medical: Not on file    Non-medical: Not on file  Tobacco Use  . Smoking status: Former Smoker    Packs/day: 0.25    Years: 2.00    Pack years: 0.50    Types: Cigarettes    Last attempt to quit: 08/03/2017    Years since quitting: 0.4  . Smokeless tobacco: Former Engineer, waterUser  Substance and Sexual Activity  . Alcohol use: No    Frequency: Never  . Drug use: No  . Sexual activity: Yes    Birth control/protection: None  Lifestyle  . Physical activity:    Days per week: Not on file    Minutes per session: Not on file  . Stress: Not on file  Relationships  . Social connections:    Talks on phone: Not on file    Gets together: Not on file    Attends religious service: Not on file    Active member of club or organization: Not on file    Attends meetings of clubs or organizations: Not on file    Relationship status: Not on file  . Intimate partner violence:    Fear of current or ex partner: Not on file    Emotionally  abused: Not on file    Physically abused: Not on file    Forced sexual activity: Not on file  Other Topics Concern  . Not on file  Social History Narrative   ** Merged History Encounter **       No outpatient medications have been marked as taking for the 01/20/18 encounter [facility](Hospital Encounter).   No Known Allergies    ROS: As per HPI, remainder of ROS negative.   OBJECTIVE:   Vitals:   01/20/18 1909  BP: 139/87  Pulse: 65  Resp: 16  Temp: 98.2 F (36.8 C)  TempSrc: Oral  SpO2: 100%     General appearance: alert; no distress Eyes: PERRL; EOMI; conjunctiva injected on right with mild lid edge crusting HENT: normocephalic; atraumatic;  oral mucosa normal Neck: supple Back: no CVA tenderness Extremities: no cyanosis or edema; symmetrical with no gross deformities Skin: warm and dry Neurologic: normal gait; grossly normal Psychological: alert and cooperative; normal mood and affect      Labs:  Results for orders placed or performed during the hospital encounter of 10/01/17  OB RESULT CONSOLE Group  B Strep  Result Value Ref Range   GBS Positive   CBC  Result Value Ref Range   WBC 6.8 4.0 - 10.5 K/uL   RBC 4.63 3.87 - 5.11 MIL/uL   Hemoglobin 12.7 12.0 - 15.0 g/dL   HCT 16.1 09.6 - 04.5 %   MCV 82.7 78.0 - 100.0 fL   MCH 27.4 26.0 - 34.0 pg   MCHC 33.2 30.0 - 36.0 g/dL   RDW 40.9 81.1 - 91.4 %   Platelets 255 150 - 400 K/uL  Comprehensive metabolic panel  Result Value Ref Range   Sodium 133 (L) 135 - 145 mmol/L   Potassium 4.1 3.5 - 5.1 mmol/L   Chloride 105 101 - 111 mmol/L   CO2 18 (L) 22 - 32 mmol/L   Glucose, Bld 157 (H) 65 - 99 mg/dL   BUN 7 6 - 20 mg/dL   Creatinine, Ser 7.82 0.44 - 1.00 mg/dL   Calcium 8.8 (L) 8.9 - 10.3 mg/dL   Total Protein 6.5 6.5 - 8.1 g/dL   Albumin 3.0 (L) 3.5 - 5.0 g/dL   AST 21 15 - 41 U/L   ALT 12 (L) 14 - 54 U/L   Alkaline Phosphatase 174 (H) 38 - 126 U/L   Total Bilirubin 0.7 0.3 - 1.2 mg/dL   GFR calc non Af  Amer >60 >60 mL/min   GFR calc Af Amer >60 >60 mL/min   Anion gap 10 5 - 15  RPR  Result Value Ref Range   RPR Ser Ql Non Reactive Non Reactive  Glucose, capillary  Result Value Ref Range   Glucose-Capillary 118 (H) 65 - 99 mg/dL  Glucose, capillary  Result Value Ref Range   Glucose-Capillary 106 (H) 65 - 99 mg/dL  Glucose, capillary  Result Value Ref Range   Glucose-Capillary 89 65 - 99 mg/dL  Glucose, capillary  Result Value Ref Range   Glucose-Capillary 90 65 - 99 mg/dL  Type and screen  Result Value Ref Range   ABO/RH(D) O POS    Antibody Screen NEG    Sample Expiration      10/04/2017 Performed at [facility], [address] [place], [place] [address]     Labs Reviewed - No data to display  No results found.     ASSESSMENT & PLAN:  1. Blepharoconjunctivitis of right eye, unspecified blepharoconjunctivitis type   Remember to wash the pillow case to prevent reinfection or spread to the other eye  Wash hands regularly  Meds ordered this encounter  Medications  . tobramycin (TOBREX) 0.3 % ophthalmic solution    Sig: Place 1 drop into the right eye every 6 (six) hours.    Dispense:  5 mL    Refill:  0    Reviewed expectations re: course of current medical issues. Questions answered. Outlined signs and symptoms indicating need for more acute intervention. Patient verbalized understanding. After Visit Summary given.    Procedures:      Elvina Sidle, MD 01/20/18 1955

## 2018-01-20 NOTE — ED Triage Notes (Signed)
Pt c/o right eye redness, drainage and crusting x 2 days.  Denies any vision changes.

## 2018-01-20 NOTE — Discharge Instructions (Addendum)
Remember to wash the pillow case to prevent reinfection or spread to the other eye  Wash hands regularly

## 2018-02-02 ENCOUNTER — Ambulatory Visit (HOSPITAL_COMMUNITY)
Admission: EM | Admit: 2018-02-02 | Discharge: 2018-02-02 | Disposition: A | Discharge status: Home / Self Care | Payer: Medicaid Other | Attending: Family Medicine | Admitting: Family Medicine

## 2018-02-02 ENCOUNTER — Encounter (HOSPITAL_COMMUNITY): Payer: Self-pay

## 2018-02-02 DIAGNOSIS — Z3202 Encounter for pregnancy test, result negative: Secondary | ICD-10-CM

## 2018-02-02 DIAGNOSIS — N898 Other specified noninflammatory disorders of vagina: Secondary | ICD-10-CM | POA: Diagnosis not present

## 2018-02-02 DIAGNOSIS — Z87891 Personal history of nicotine dependence: Secondary | ICD-10-CM | POA: Diagnosis not present

## 2018-02-02 DIAGNOSIS — N939 Abnormal uterine and vaginal bleeding, unspecified: Secondary | ICD-10-CM | POA: Diagnosis not present

## 2018-02-02 LAB — POCT PREGNANCY, URINE: Preg Test, Ur: NEGATIVE

## 2018-02-02 LAB — POCT URINALYSIS DIP (DEVICE)
Bilirubin Urine: NEGATIVE
GLUCOSE, UA: NEGATIVE mg/dL
Ketones, ur: 15 mg/dL — AB
NITRITE: NEGATIVE
Protein, ur: NEGATIVE mg/dL
Specific Gravity, Urine: >=1.03 (ref 1.005–1.030)
Urobilinogen, UA: 0.2 mg/dL (ref 0.0–1.0)
pH: 5.5 (ref 5.0–8.0)

## 2018-02-02 NOTE — Discharge Instructions (Addendum)
It was nice meeting you!!  No alarming signs on exam. The irregular bleeding could be hormonal or from infection. We are testing you for STDs and infection and will call with the results and treat if needed. Follow-up with OB/GYN as needed.

## 2018-02-02 NOTE — ED Notes (Signed)
Bed: UC01 Expected date:  Expected time:  Means of arrival:  Comments: Hold for appointments

## 2018-02-02 NOTE — ED Provider Notes (Signed)
[facility]    CSN: [id number] Arrival date & time: 02/02/18  4098     History   Chief Complaint Chief Complaint  Patient presents with  . Vaginal Discharge    HPI Sandra Anderson is a 27 y.o. female.   Patient is a healthy 27 year old female that presents with brown vaginal discharge that is present when she wipes.  Last menstrual period was the last week in June. She denies any dysuria, frequency, hematuria, rashes, itching, irritation, odor.  She denies any abdominal pain, back pain, pelvic pain.  She did have a recent pregnancy but is not breast-feeding.  She is not on birth control.   ROS per HPI      Past Medical History:  Diagnosis Date  . Chlamydia contact, treated   . Depression    pp depression after first and second children no counseling or meds  . Gestational diabetes 08/03/2017   diet controlled    Patient Active Problem List   Diagnosis Date Noted  . SVD (spontaneous vaginal delivery) 10/02/2017  . Gestational diabetes mellitus (GDM) affecting pregnancy 10/01/2017  . Positive GBS test 09/18/2017  . Impaired glucose tolerance test 08/15/2017  . Gestational diabetes 07/20/2017  . Schizophrenia (HCC) 06/21/2017  . Maternal varicella, non-immune 06/21/2017  . Bipolar affective (HCC) 06/21/2017  . ASCUS of cervix with negative high risk HPV 06/21/2017  . Supervision of high risk pregnancy, antepartum 03/03/2017  . History of ectopic pregnancy 03/03/2017  . History of prior pregnancy with IUGR newborn 03/03/2017    Past Surgical History:  Procedure Laterality Date  . MOUTH SURGERY      OB History    Gravida  5   Para  4   Term  4   Preterm  0   AB  1   Living  4     SAB  0   TAB  0   Ectopic  1   Multiple  0   Live Births  4            Home Medications    Prior to Admission medications   Medication Sig Start Date End Date Taking? Authorizing Provider  tobramycin (TOBREX) 0.3 % ophthalmic solution Place 1 drop  into the right eye every 6 (six) hours. 01/20/18   Elvina Sidle, MD    Family History Family History  Problem Relation Age of Onset  . Hypertension Mother   . Cirrhosis Mother   . Alcohol abuse Mother     Social History Social History   Tobacco Use  . Smoking status: Former Smoker    Packs/day: 0.25    Years: 2.00    Pack years: 0.50    Types: Cigarettes    Last attempt to quit: 08/03/2017    Years since quitting: 0.5  . Smokeless tobacco: Former Engineer, water Use Topics  . Alcohol use: No    Frequency: Never  . Drug use: No     Allergies   Patient has no known allergies.   Review of Systems Review of Systems   Physical Exam Triage Vital Signs ED Triage Vitals  Enc Vitals Group     BP 02/02/18 0855 (!) 147/97     Pulse Rate 02/02/18 0855 80     Resp 02/02/18 0855 16     Temp 02/02/18 0855 98.2 F (36.8 C)     Temp Source 02/02/18 0855 Oral     SpO2 02/02/18 0855 96 %     Weight --  Height --      Head Circumference --      Peak Flow --      Pain Score 02/02/18 0906 0     Pain Loc --      Pain Edu? --      Excl. in GC? --    No data found.  Updated Vital Signs BP (!) 147/97 (BP Location: Right Arm)   Pulse 80   Temp 98.2 F (36.8 C) (Oral)   Resp 16   LMP 01/19/2018   SpO2 96%   Visual Acuity Right Eye Distance:   Left Eye Distance:   Bilateral Distance:    Right Eye Near:   Left Eye Near:    Bilateral Near:     Physical Exam  Constitutional: She is oriented to person, place, and time. She appears well-developed and well-nourished.  HENT:  Head: Normocephalic and atraumatic.  Pulmonary/Chest: Effort normal.  Abdominal: Soft. Bowel sounds are normal. She exhibits no distension and no mass. There is no tenderness. There is no rebound and no guarding. No hernia. Hernia confirmed negative in the right inguinal area and confirmed negative in the left inguinal area.  Genitourinary: Rectum normal and uterus normal. Pelvic exam was  performed with patient supine. There is no rash, tenderness or lesion on the right labia. There is no rash, tenderness or lesion on the left labia. Cervix exhibits discharge. Cervix exhibits no motion tenderness and no friability. Right adnexum displays no mass and no tenderness. Left adnexum displays no mass and no tenderness. Vaginal discharge found.    Lymphadenopathy: No inguinal adenopathy noted on the right or left side.  Neurological: She is alert and oriented to person, place, and time.  Skin: Skin is warm and dry. Capillary refill takes less than 2 seconds.  Psychiatric: She has a normal mood and affect.     UC Treatments / Results  Labs (all labs ordered are listed, but only abnormal results are displayed) Labs Reviewed  POCT URINALYSIS DIP (DEVICE) - Abnormal; Notable for the following components:      Result Value   Ketones, ur 15 (*)    Hgb urine dipstick SMALL (*)    Leukocytes, UA TRACE (*)    All other components within normal limits  URINE CULTURE  CERVICOVAGINAL ANCILLARY ONLY    EKG None  Radiology No results found.  Procedures Procedures (including critical care time)  Medications Ordered in UC Medications - No data to display  Initial Impression / Assessment and Plan / UC Course  I have reviewed the triage vital signs and the nursing notes.  Pertinent labs & imaging results that were available during my care of the patient were reviewed by me and considered in my medical decision making (see chart for details).    No concern for ectopic pregnancy or PID.  Urine showed trace blood with trace leukocytes.  Negative for pregnancy.  Will send for culture.  Urine hgb likely due to vaginal bleeding.  Testing for STDs, BV, trichomoniasis, yeast.  Will treat as needed based on results.  Told to follow-up at the women's clinic if symptoms worsen or do not resolve.  Final Clinical Impressions(s) / UC Diagnoses   Final diagnoses:  Abnormal uterine bleeding      Discharge Instructions     It was nice meeting you!!  No alarming signs on exam. The irregular bleeding could be hormonal or from infection. We are testing you for STDs and infection and will call with the results  and treat if needed. Follow-up with OB/GYN as needed.     ED Prescriptions    None     Controlled Substance Prescriptions [place] Controlled Substance Registry consulted? Not Applicable   Janace ArisBast, Freman Lapage A, NP 02/02/18 1035

## 2018-02-02 NOTE — ED Triage Notes (Signed)
Pt presents with vaginal discharge . Pt states it is brown when she wipes, not pain, itchiness or irritation with it. Pt states that she has had slight cramping with it

## 2018-02-03 LAB — URINE CULTURE

## 2018-02-05 ENCOUNTER — Telehealth (HOSPITAL_COMMUNITY): Payer: Self-pay

## 2018-02-05 LAB — CERVICOVAGINAL ANCILLARY ONLY
BACTERIAL VAGINITIS: NEGATIVE
Candida vaginitis: POSITIVE — AB
Chlamydia: NEGATIVE
Neisseria Gonorrhea: NEGATIVE
Trichomonas: NEGATIVE

## 2018-02-05 MED ORDER — FLUCONAZOLE 150 MG PO TABS: 150.0000 mg | ORAL_TABLET | Freq: Every day | ORAL | 0 refills | Status: AC

## 2018-02-05 NOTE — Telephone Encounter (Signed)
Pt contacted regarding test for candida (yeast) was positive.  Prescription for fluconazole 150mg po now, repeat dose in 3d if needed, #2 no refills, sent to the pharmacy of record.  Recheck or followup with PCP for further evaluation if symptoms are not improving.  Answered all questions.  

## 2018-02-09 ENCOUNTER — Encounter (HOSPITAL_COMMUNITY): Payer: Self-pay

## 2018-02-09 ENCOUNTER — Ambulatory Visit (HOSPITAL_COMMUNITY)
Admission: EM | Admit: 2018-02-09 | Discharge: 2018-02-09 | Disposition: A | Discharge status: Home / Self Care | Payer: Medicaid Other | Attending: Internal Medicine | Admitting: Internal Medicine

## 2018-02-09 DIAGNOSIS — Z811 Family history of alcohol abuse and dependence: Secondary | ICD-10-CM | POA: Insufficient documentation

## 2018-02-09 DIAGNOSIS — Z87891 Personal history of nicotine dependence: Secondary | ICD-10-CM | POA: Diagnosis not present

## 2018-02-09 DIAGNOSIS — N898 Other specified noninflammatory disorders of vagina: Secondary | ICD-10-CM | POA: Diagnosis not present

## 2018-02-09 DIAGNOSIS — Z8249 Family history of ischemic heart disease and other diseases of the circulatory system: Secondary | ICD-10-CM | POA: Insufficient documentation

## 2018-02-09 DIAGNOSIS — Z8489 Family history of other specified conditions: Secondary | ICD-10-CM | POA: Insufficient documentation

## 2018-02-09 DIAGNOSIS — L292 Pruritus vulvae: Secondary | ICD-10-CM | POA: Diagnosis not present

## 2018-02-09 LAB — POCT URINALYSIS DIP (DEVICE)
Bilirubin Urine: NEGATIVE
Glucose, UA: NEGATIVE mg/dL
HGB URINE DIPSTICK: NEGATIVE
Ketones, ur: NEGATIVE mg/dL
Leukocytes, UA: NEGATIVE
Nitrite: NEGATIVE
Protein, ur: NEGATIVE mg/dL
SPECIFIC GRAVITY, URINE: 1.025 (ref 1.005–1.030)
UROBILINOGEN UA: 0.2 mg/dL (ref 0.0–1.0)
pH: 6 (ref 5.0–8.0)

## 2018-02-09 MED ORDER — METRONIDAZOLE 500 MG PO TABS: 500.0000 mg | ORAL_TABLET | Freq: Two times a day (BID) | ORAL | 0 refills | Status: DC

## 2018-02-09 NOTE — ED Triage Notes (Signed)
Pt presents with vaginal odor and mild itchiness.

## 2018-02-09 NOTE — Discharge Instructions (Addendum)
Vaginal self-swab obtained Prescribed metronidazole 500 mg twice daily for 7 days (do not take while consuming alcohol) Take medications as prescribed and to completion We will follow up with you regarding the results of your test If tests are positive, please abstain from sexual activity for at least 7 days and notify partners Follow up with PCP if symptoms persists Return here or go to ER if you have any new or worsening symptoms

## 2018-02-09 NOTE — ED Provider Notes (Signed)
[facility]   [id number] 02/09/18 Arrival Time: 1126   SUBJECTIVE:  Sandra Anderson is a 27 y.o. female who presents with complaints of "fishy" vaginal odor and itching that began last night.  First noticed symptoms while having sex.  Patient is sexually active with 1 female partner.  Describes vaginal odor as "fishy."  She has NOT tried OTC medications.  Denies worsening symptoms.  She reports similar symptoms in the past and was diagnosed with BV and yeast.  Patient recently treated for yeast.  She denies fever, chills, nausea, vomiting, abdominal or pelvic pain, urinary symptoms, vaginal itching, vaginal bleeding, dyspareunia, vaginal rashes or lesions.   Patient's last menstrual period was 01/19/2018.  ROS: As per HPI.  Past Medical History:  Diagnosis Date  . Chlamydia contact, treated   . Depression    pp depression after first and second children no counseling or meds  . Gestational diabetes 08/03/2017   diet controlled   Past Surgical History:  Procedure Laterality Date  . MOUTH SURGERY     No Known Allergies No current facility-administered medications on file prior to encounter.    No current outpatient medications on file prior to encounter.    Social History   Socioeconomic History  . Marital status: Single    Spouse name: Not on file  . Number of children: Not on file  . Years of education: Not on file  . Highest education level: Not on file  Occupational History  . Not on file  Social Needs  . Financial resource strain: Not on file  . Food insecurity:    Worry: Not on file    Inability: Not on file  . Transportation needs:    Medical: Not on file    Non-medical: Not on file  Tobacco Use  . Smoking status: Former Smoker    Packs/day: 0.25    Years: 2.00    Pack years: 0.50    Types: Cigarettes    Last attempt to quit: 08/03/2017    Years since quitting: 0.5  . Smokeless tobacco: Former Engineer, water and Sexual Activity  . Alcohol use: No     Frequency: Never  . Drug use: No  . Sexual activity: Yes    Birth control/protection: None  Lifestyle  . Physical activity:    Days per week: Not on file    Minutes per session: Not on file  . Stress: Not on file  Relationships  . Social connections:    Talks on phone: Not on file    Gets together: Not on file    Attends religious service: Not on file    Active member of club or organization: Not on file    Attends meetings of clubs or organizations: Not on file    Relationship status: Not on file  . Intimate partner violence:    Fear of current or ex partner: Not on file    Emotionally abused: Not on file    Physically abused: Not on file    Forced sexual activity: Not on file  Other Topics Concern  . Not on file  Social History Narrative   ** Merged History Encounter **       Family History  Problem Relation Age of Onset  . Hypertension Mother   . Cirrhosis Mother   . Alcohol abuse Mother     OBJECTIVE:  Vitals:   02/09/18 1159  BP: (!) 142/98  Pulse: 60  Resp: 20  Temp: 98.8 F (37.1 C)  TempSrc: Temporal  SpO2: 100%     General appearance: alert, cooperative, appears stated age and no distress Throat: lips, mucosa, and tongue normal; teeth and gums normal Lungs: CTA bilaterally without adventitious breath sounds Heart: regular rate and rhythm.  Radial pulses 2+ symmetrical bilaterally Back: no CVA tenderness Abdomen: soft, non-tender; bowel sounds normal; no guarding GU: deferred Skin: warm and dry Psychological:  Alert and cooperative. Normal mood and affect.  ASSESSMENT & PLAN:  1. Vaginal odor   2. Vaginal itching     Meds ordered this encounter  Medications  . metroNIDAZOLE (FLAGYL) 500 MG tablet    Sig: Take 1 tablet (500 mg total) by mouth 2 (two) times daily.    Dispense:  14 tablet    Refill:  0    Order Specific Question:   Supervising Provider    Answer:   Isa RankinMURRAY, LAURA WILSON [[id number]]    Pending: Labs Reviewed    CERVICOVAGINAL ANCILLARY ONLY    Vaginal self-swab obtained Prescribed metronidazole 500 mg twice daily for 7 days (do not take while consuming alcohol) Take medication as prescribed and to completion We will follow up with you regarding the results of your tests If tests are positive, please abstain from sexual activity for at least 7 days and notify partners Follow up with PCP if symptoms persists Return here or go to ER if you have any new or worsening symptoms    Reviewed expectations re: course of current medical issues. Questions answered. Outlined signs and symptoms indicating need for more acute intervention. Patient verbalized understanding. After Visit Summary given.       Rennis HardingWurst, [name], PA-C 02/09/18 1641

## 2018-02-12 LAB — CERVICOVAGINAL ANCILLARY ONLY
Bacterial vaginitis: POSITIVE — AB
CANDIDA VAGINITIS: NEGATIVE
CHLAMYDIA, DNA PROBE: NEGATIVE
Neisseria Gonorrhea: NEGATIVE
TRICH (WINDOWPATH): NEGATIVE

## 2018-06-14 ENCOUNTER — Encounter: Payer: Self-pay | Admitting: Family Medicine

## 2018-06-14 ENCOUNTER — Ambulatory Visit: Payer: Medicaid Other | Admitting: Family Medicine

## 2018-06-14 VITALS — BP 132/88 | HR 76 | Ht 67.0 in | Wt 173.1 lb

## 2018-06-14 DIAGNOSIS — Z3202 Encounter for pregnancy test, result negative: Secondary | ICD-10-CM

## 2018-06-14 DIAGNOSIS — Z308 Encounter for other contraceptive management: Secondary | ICD-10-CM | POA: Diagnosis not present

## 2018-06-14 LAB — POCT URINE PREGNANCY: Preg Test, Ur: NEGATIVE

## 2018-06-14 MED ORDER — MEDROXYPROGESTERONE ACETATE 150 MG/ML IM SUSP: 150.0000 mg | INTRAMUSCULAR | 12 refills | Status: DC

## 2018-06-14 NOTE — Progress Notes (Signed)
Pt presents for Depo. Last IC 2 wks ago.

## 2018-06-14 NOTE — Progress Notes (Signed)
  Subjective:     Patient ID: Leilani AbleAmillia Hindle, female   DOB: 1991-02-15, 27 y.o.   MRN: [id number]  [id number] who is recently postpartum and interested in birth control. Has discussed options with another provider and decided to get Depo. Does not want BTL at this time. Not interested in longer forms of birth control - worried strings of IUD will bother partner and doesn't want a scar from a Nexplanon. Denies any abnormal discharge. Has had return of menses, regular.    Review of Systems     Objective:   Physical Exam  Constitutional: She is oriented to person, place, and time. She appears well-developed and well-nourished. No distress.  HENT:  Head: Normocephalic and atraumatic.  Eyes: Conjunctivae and EOM are normal.  Cardiovascular: Normal rate.  Pulmonary/Chest: No respiratory distress.  Musculoskeletal: She exhibits no edema.  Neurological: She is alert and oriented to person, place, and time.  Psychiatric: She has a normal mood and affect. Her behavior is normal.  Nursing note and vitals reviewed.      Assessment:     27yo [id number] recently postpartum who presents for contraception visit. UPT negative, desires Depo.     Plan:     -- Rx for Depo to pharmacy -- instructed patient to return tomorrow for placement by RN -- counseled on other methods of birth control and importance of q7064month depo for adequate contraception  Loreda Silverio S. Earlene PlaterWallace, DO OB/GYN Fellow

## 2018-06-14 NOTE — Patient Instructions (Signed)
Contraceptive Injection Information A contraceptive injection is a shot that prevents pregnancy. It is effective for 3 months. How does the injection work? For this injection, the medicine progestin is injected into your body. The medicine is injected under the skin or in the muscle, usually in the upper arm or the buttock. Progestin has similar effects to the hormone progesterone, which is involved with the menstrual cycle and pregnancy. The progestin injection prevents pregnancy by:  Stopping the ovaries from releasing eggs.  Thickening cervical mucus to prevent sperm from entering the cervix.  Thinning the lining of the uterus to prevent a fertilized egg from attaching to the uterus.  What are the advantages of this form of birth control? The following are some advantages of this form of birth control:  It is highly effective at preventing pregnancy when used correctly.  It can slow down the flow of heavy menstrual periods.  It can control cramps and painful menstrual periods.  It may temporarily stop your menstrual periods (amenorrhea).  It lowers your risk for developing cancer of the uterus and pelvic inflammatory disease (PID).  What are the disadvantages of this form of birth control? The following are some disadvantages of this form of birth control:  It can be associated with side effects such as: ? Weight gain. ? Spotting or bleeding between periods. ? Breast tenderness. ? Headaches. ? Abdominal discomfort. ? Nervousness. ? Loss of bone density.  It does not protect against STIs (sexually transmitted infections).  You must visit your health care provider every 3 months (12 weeks) to receive the injection.  The injections may be uncomfortable.  It can take you up to a year to become pregnant after you stop the injections.  Am I a good candidate for these injections? Your health care provider can help you determine whether you are a good candidate for  contraceptive injections. Make sure to discuss the possible side effects with your health care provider. You should not use this contraceptive if you have a history of:  Breast cancer.  High blood pressure.  Heart attack or stroke.  Liver, heart, or kidney disease.  Liver cancer.  Diabetes.  Unexplained vaginal bleeding.  Rheumatoid arthritis.  Migraines.  Summary  A contraceptive injection is a shot that prevents pregnancy. It is effective for 3 months.  This injection prevents pregnancy by thickening the cervical mucus, thinning the uterine wall, and stopping the ovaries from releasing eggs.  This type of birth control is highly effective at preventing pregnancy. It can also slow down the flow of periods or stop them temporarily.  Side effects of this injection can include weight gain, headaches, bleeding between periods, nervousness, and loss of bone density.  Your health care provider can help you determine whether you are a good candidate for contraceptive injections. This information is not intended to replace advice given to you by your health care provider. Make sure you discuss any questions you have with your health care provider. Document Released: 06/30/2011 Document Revised: 05/30/2016 Document Reviewed: 05/30/2016 Elsevier Interactive Patient Education  2017 Elsevier Inc.  

## 2018-06-15 ENCOUNTER — Ambulatory Visit (INDEPENDENT_AMBULATORY_CARE_PROVIDER_SITE_OTHER): Payer: Medicaid Other

## 2018-06-15 VITALS — BP 129/85 | HR 52 | Resp 16 | Ht 68.0 in | Wt 173.3 lb

## 2018-06-15 DIAGNOSIS — Z3042 Encounter for surveillance of injectable contraceptive: Secondary | ICD-10-CM | POA: Diagnosis not present

## 2018-06-15 MED ORDER — MEDROXYPROGESTERONE ACETATE 150 MG/ML IM SUSP
150.0000 mg | Freq: Once | INTRAMUSCULAR | Status: AC
  Administered 2018-06-15: 150 mg via INTRAMUSCULAR

## 2018-06-15 NOTE — Progress Notes (Signed)
After obtaining consent, and per orders of Dr. Coral Ceoharles Harper, injection of Depo Provera was administered by Riley NearingMorehead, Ambermarie Honeyman Byrd. Injection administered - RUOQ with no complications. Patient advised to schedule next Depo Provera injection between 08/31/18 - 09/14/18. Patient stated she understood. Patient instructed to remain in clinic for 20 minutes afterwards, and to report any adverse reaction to me immediately.

## 2018-08-31 ENCOUNTER — Ambulatory Visit: Payer: Medicaid Other

## 2018-09-06 ENCOUNTER — Encounter: Payer: Self-pay | Admitting: Obstetrics

## 2018-09-08 IMAGING — US US MFM OB DETAIL+14 WK
1 series · 14 of 28 positions shown · non-contrast
Comparison: none

[Series 1: us mfm ob detail+14 wk · 14 of 89 slices shown]
[im 4/89]
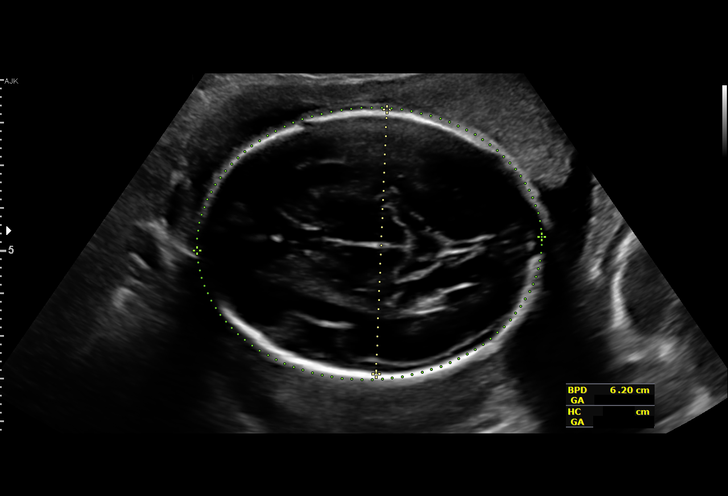
[im 10/89]
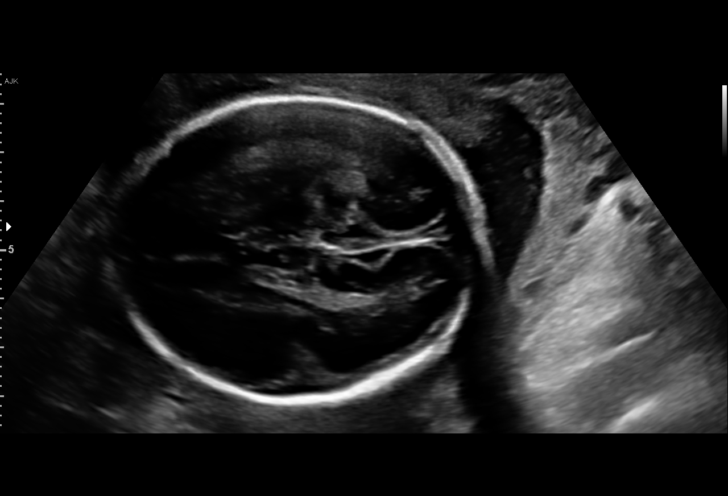
[im 17/89]
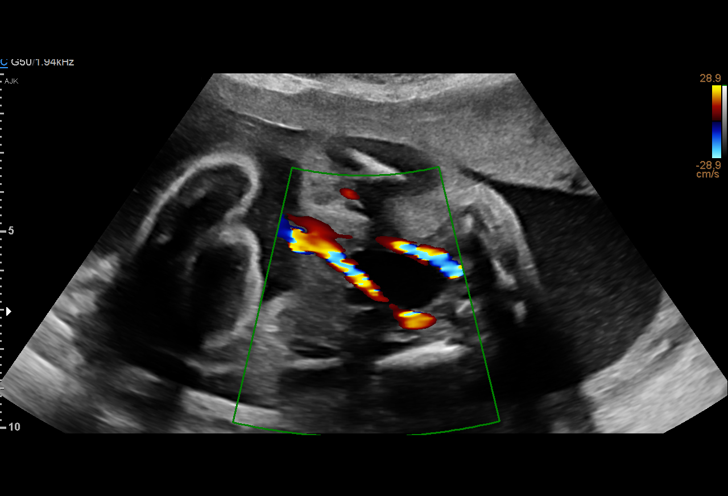
[im 23/89]
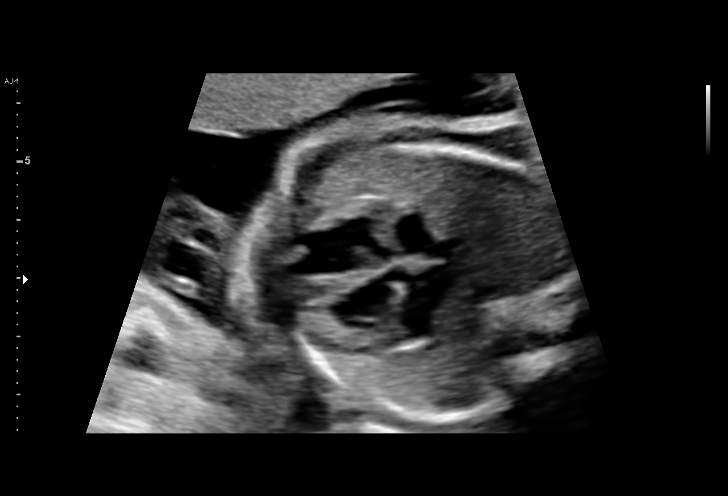
[im 30/89]
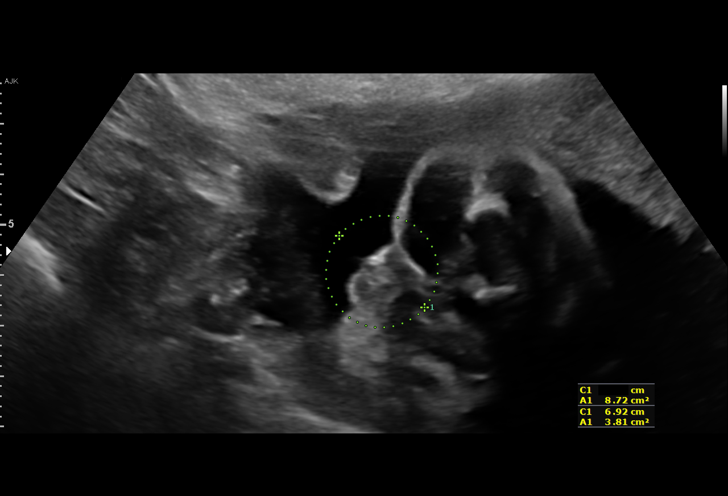
[im 36/89]
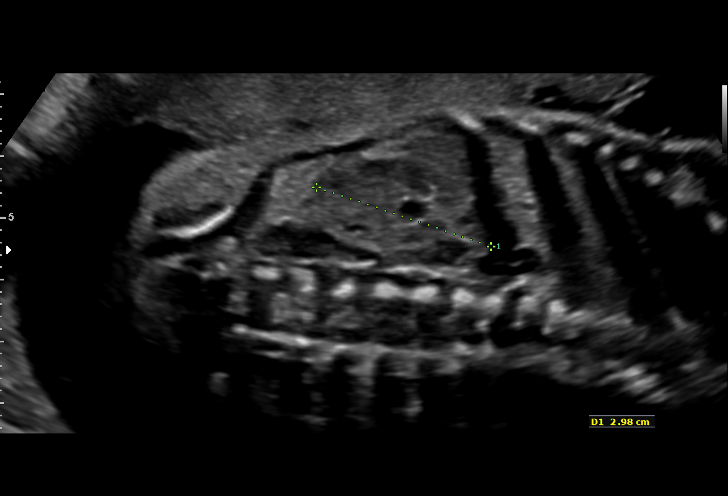
[im 43/89]
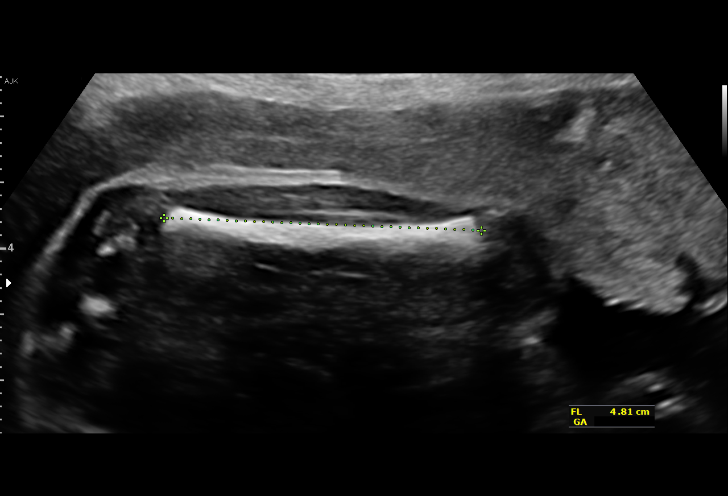
[im 49/89]
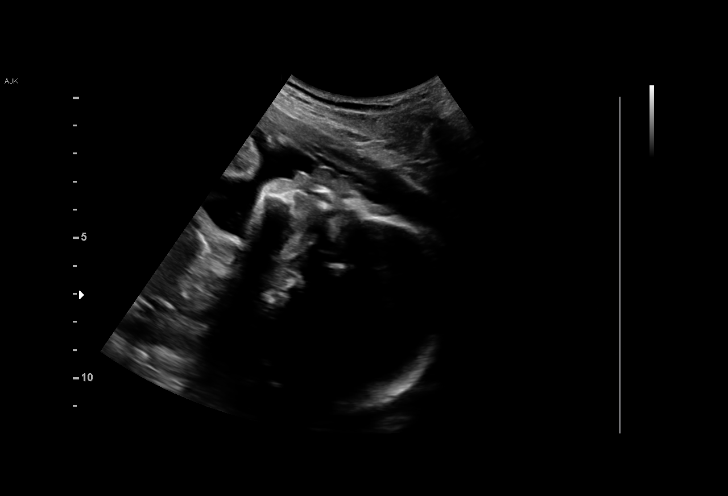
[im 56/89]
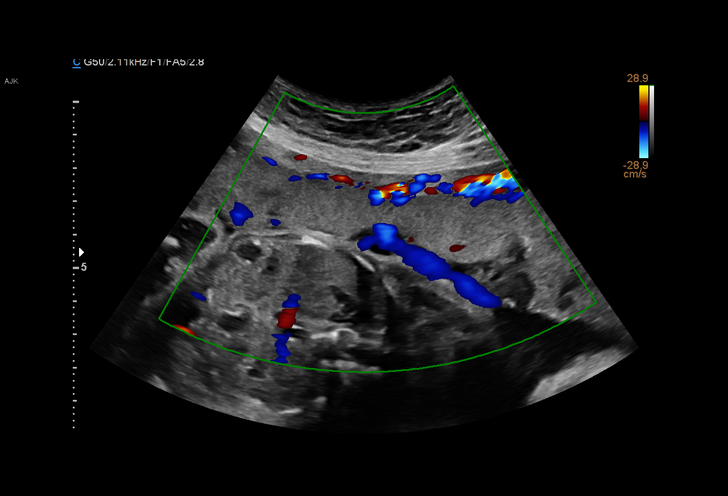
[im 62/89]
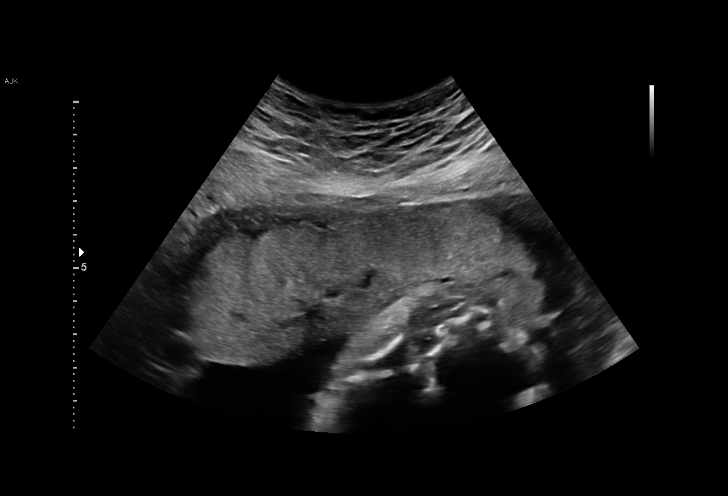
[im 69/89]
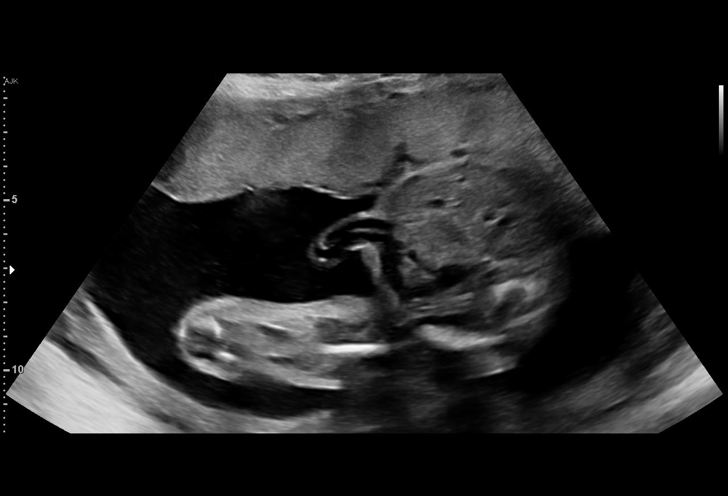
[im 75/89]
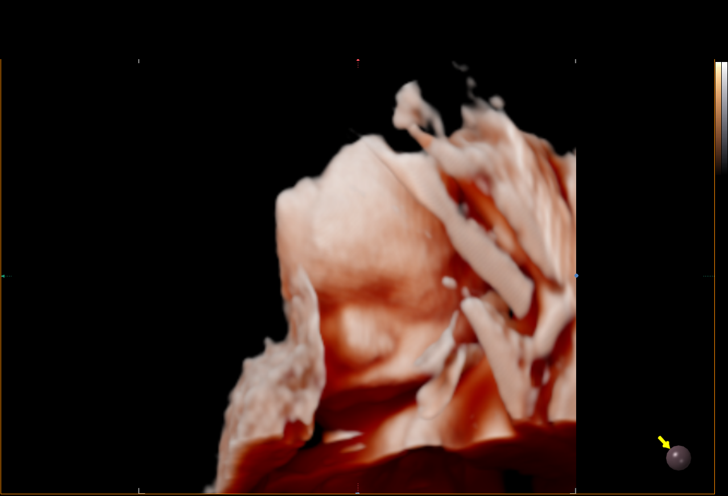
[im 82/89]
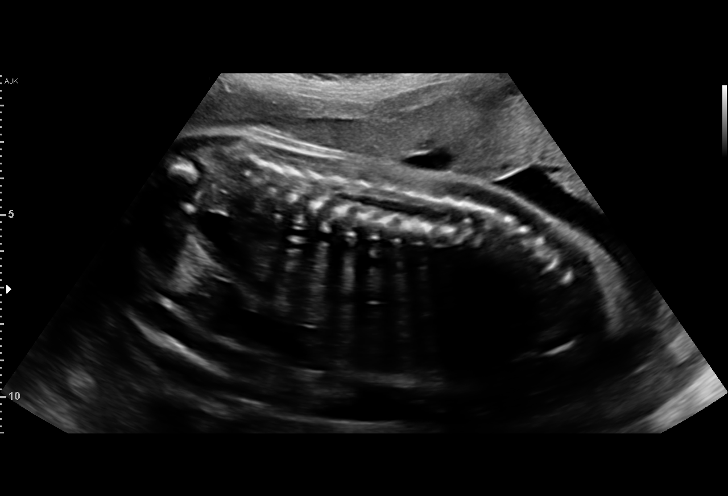
[im 89/89]
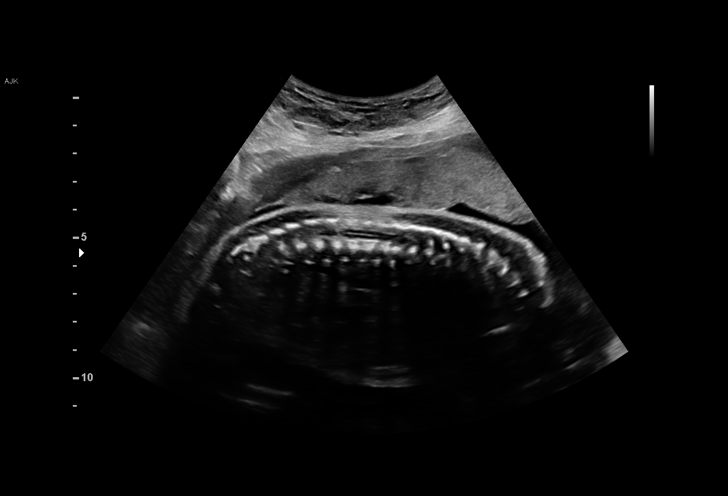

[14 of 28 positions shown; findings below may reference images not displayed]

pm)

1  JODI CAGUANA              [id number]      [id number]     [id number]
Indications

26 weeks gestation of pregnancy
Insufficient Prenatal Care
Encounter for fetal anatomic survey
Intrauterine
Poor obstetric history: Previous fetal growth
restriction (FGR)
OB History

Blood Type:            Height:  5'7"   Weight (lb):  189      BMI:
Gravidity:    5         Term:   3        Prem:   0        SAB:   0
TOP:          0       Ectopic:  1        Living: 3
Fetal Evaluation

Num Of Fetuses:     1
Fetal Heart         142
Rate(bpm):
Cardiac Activity:   Observed
Presentation:       Cephalic
Placenta:           Anterior, above cervical os
P. Cord Insertion:  Visualized, central

Amniotic Fluid
AFI FV:      Subjectively within normal limits

Largest Pocket(cm)
5.31
Biometry

BPD:      61.9  mm     G. Age:  25w 1d          9  %    CI:        73.78   %   70 - 86
FL/HC:      21.3   %   18.6 -
HC:      228.9  mm     G. Age:  24w 6d        < 3  %    HC/AC:      1.07       1.04 -
AC:      214.8  mm     G. Age:  26w 0d         32  %    FL/BPD:     78.7   %   71 - 87
FL:       48.7  mm     G. Age:  26w 3d         39  %    FL/AC:      22.7   %   20 - 24

Est. FW:     874  gm    1 lb 15 oz      47  %
Gestational Age

LMP:           26w 2d       Date:   12/25/16                 EDD:   10/01/17
U/S Today:     25w 4d                                        EDD:   10/06/17
Best:          26w 2d    Det. By:   LMP  (12/25/16)          EDD:   10/01/17
Anatomy

Cranium:               Appears normal         Aortic Arch:            Appears normal
Cavum:                 Appears normal         Ductal Arch:            Appears normal
Ventricles:            Appears normal         Diaphragm:              Appears normal
Choroid Plexus:        Appears normal         Stomach:                Appears normal, left
sided
Cerebellum:            Appears normal         Abdomen:                Appears normal
Posterior Fossa:       Appears normal         Abdominal Wall:         Appears nml (cord
insert, abd wall)
Nuchal Fold:           Appears normal         Cord Vessels:           Appears normal (3
vessel cord)
Face:                  Appears normal         Kidneys:                Appear normal
(orbits and profile)
Lips:                  Appears normal         Bladder:                Appears normal
Thoracic:              Appears normal         Spine:                  Appears normal
Heart:                 Appears normal         Upper Extremities:      Appears normal
(4CH, axis, and
situs)
RVOT:                  Appears normal         Lower Extremities:      Appears normal
LVOT:                  Appears normal

Other:  Fetus appears to be a female. Heels and 5th digit visualized. Nasal
bone visualized.
Cervix Uterus Adnexa

Cervix
Length:           3.62  cm.
Normal appearance by transabdominal scan.

Uterus
No abnormality visualized.

Left Ovary
Not visualized.

Right Ovary
Not visualized.

Adnexa:       No abnormality visualized.
Impression

SIUP at 26+2 weeks
Normal detailed fetal anatomy
Markers of aneuploidy: none
Normal amniotic fluid volume
Measurements consistent with LMP dating; EFW at the 47th
%tile
Recommendations

Follow-up as clinically indicated

## 2018-10-11 ENCOUNTER — Other Ambulatory Visit: Payer: Self-pay

## 2018-10-11 ENCOUNTER — Ambulatory Visit (INDEPENDENT_AMBULATORY_CARE_PROVIDER_SITE_OTHER): Payer: Medicaid Other

## 2018-10-11 DIAGNOSIS — Z3042 Encounter for surveillance of injectable contraceptive: Secondary | ICD-10-CM

## 2018-10-11 DIAGNOSIS — Z3202 Encounter for pregnancy test, result negative: Secondary | ICD-10-CM | POA: Diagnosis not present

## 2018-10-11 LAB — POCT URINE PREGNANCY: PREG TEST UR: NEGATIVE

## 2018-10-11 NOTE — Progress Notes (Signed)
Nurse visit for Depo restart. UPT neg today. Pt to abstain from IC x 14 days. Pt will RTO in 2 wks for 2nd UPT and Depo inj.  Pt informed to bring Depo to the appt.

## 2018-10-11 NOTE — Progress Notes (Signed)
I have reviewed the chart and agree with nursing staff's documentation of this patient's encounter.  Hilaria Titsworth, MD 10/11/2018 11:16 AM    

## 2018-10-25 ENCOUNTER — Other Ambulatory Visit: Payer: Self-pay | Admitting: Family Medicine

## 2018-10-25 ENCOUNTER — Ambulatory Visit: Payer: Medicaid Other

## 2018-10-25 DIAGNOSIS — Z308 Encounter for other contraceptive management: Secondary | ICD-10-CM

## 2018-11-01 ENCOUNTER — Ambulatory Visit (INDEPENDENT_AMBULATORY_CARE_PROVIDER_SITE_OTHER): Payer: Medicaid Other

## 2018-11-01 ENCOUNTER — Other Ambulatory Visit: Payer: Self-pay

## 2018-11-01 DIAGNOSIS — Z3202 Encounter for pregnancy test, result negative: Secondary | ICD-10-CM | POA: Diagnosis not present

## 2018-11-01 DIAGNOSIS — Z3042 Encounter for surveillance of injectable contraceptive: Secondary | ICD-10-CM | POA: Diagnosis not present

## 2018-11-01 LAB — POCT URINE PREGNANCY: Preg Test, Ur: NEGATIVE

## 2018-11-01 MED ORDER — MEDROXYPROGESTERONE ACETATE 150 MG/ML IM SUSP
150.0000 mg | Freq: Once | INTRAMUSCULAR | Status: AC
  Administered 2018-11-01: 14:00:00 150 mg via INTRAMUSCULAR

## 2018-11-01 NOTE — Progress Notes (Signed)
Patient ID: Sandra Anderson, female   DOB: February 02, 1991, 28 y.o.   MRN: [id number] I have reviewed the chart and agree with nursing staff's documentation of this patient's encounter.  Scheryl Darter, MD 11/01/2018 5:01 PM

## 2018-11-01 NOTE — Progress Notes (Signed)
Nurse visit for 2nd UPT for Depo restart.  UPT neg today. Denies IC per pt.  Depo given RUOQ w/o difficulty Next Depo due June 25-July 9

## 2019-01-31 ENCOUNTER — Ambulatory Visit (INDEPENDENT_AMBULATORY_CARE_PROVIDER_SITE_OTHER): Payer: Medicaid Other

## 2019-01-31 ENCOUNTER — Other Ambulatory Visit: Payer: Self-pay

## 2019-01-31 DIAGNOSIS — Z3042 Encounter for surveillance of injectable contraceptive: Secondary | ICD-10-CM | POA: Diagnosis not present

## 2019-01-31 MED ORDER — MEDROXYPROGESTERONE ACETATE 150 MG/ML IM SUSP
150.0000 mg | Freq: Once | INTRAMUSCULAR | Status: AC
  Administered 2019-01-31: 10:00:00 150 mg via INTRAMUSCULAR

## 2019-01-31 NOTE — Progress Notes (Signed)
Pt is here for depo injection, injection time is within window. Depo inj given in RUOQ without difficulty. Next depo injection due 9/24-10/8.

## 2019-04-23 ENCOUNTER — Ambulatory Visit: Payer: Medicaid Other

## 2019-07-22 ENCOUNTER — Encounter (HOSPITAL_COMMUNITY): Payer: Self-pay

## 2019-07-22 ENCOUNTER — Other Ambulatory Visit: Payer: Self-pay

## 2019-07-22 ENCOUNTER — Ambulatory Visit (HOSPITAL_COMMUNITY)
Admission: EM | Admit: 2019-07-22 | Discharge: 2019-07-22 | Disposition: A | Discharge status: Home / Self Care | Payer: Medicaid Other | Attending: Family Medicine | Admitting: Family Medicine

## 2019-07-22 DIAGNOSIS — N898 Other specified noninflammatory disorders of vagina: Secondary | ICD-10-CM | POA: Insufficient documentation

## 2019-07-22 NOTE — ED Triage Notes (Signed)
Pt states she would like to be tested for STDs. Pt states she has had some vaginal discharge. X 2 weeks.

## 2019-07-22 NOTE — Discharge Instructions (Addendum)
Sending your swab for testing and we will call with any positive results.

## 2019-07-22 NOTE — ED Provider Notes (Signed)
[place]    CSN: [id number] Arrival date & time: 07/22/19  0935      History   Chief Complaint Chief Complaint  Patient presents with  . SEXUALLY TRANSMITTED DISEASE    HPI Sandra Anderson is a 28 y.o. female.   Patient is a 28 year old female presents today for vaginal discharge x2 weeks. Symptoms have been constant.  Reporting the discharge is mostly normal but more malodorous.  Denies any associated vaginal itching or irritation.  Denies abdominal pain, back pain, fevers, dysuria, hematuria frequency. No LMP recorded. Patient has had an injection. Not terribly concerned for STDs.  ROS per HPI      Past Medical History:  Diagnosis Date  . Chlamydia contact, treated   . Depression    pp depression after first and second children no counseling or meds  . Gestational diabetes 08/03/2017   diet controlled    Patient Active Problem List   Diagnosis Date Noted  . SVD (spontaneous vaginal delivery) 10/02/2017  . Gestational diabetes mellitus (GDM) affecting pregnancy 10/01/2017  . Positive GBS test 09/18/2017  . Impaired glucose tolerance test 08/15/2017  . Gestational diabetes 07/20/2017  . Schizophrenia ([place]) 06/21/2017  . Maternal varicella, non-immune 06/21/2017  . Bipolar affective ([place]) 06/21/2017  . ASCUS of cervix with negative high risk HPV 06/21/2017  . Supervision of high risk pregnancy, antepartum 03/03/2017  . History of ectopic pregnancy 03/03/2017  . History of prior pregnancy with IUGR newborn 03/03/2017    Past Surgical History:  Procedure Laterality Date  . MOUTH SURGERY      OB History    Gravida  5   Para  4   Term  4   Preterm  0   AB  1   Living  4     SAB  0   TAB  0   Ectopic  1   Multiple  0   Live Births  4            Home Medications    Prior to Admission medications   Medication Sig Start Date End Date Taking? Authorizing Provider  medroxyPROGESTERone (DEPO-PROVERA) 150 MG/ML injection  INJECT 1 ML (150 MG TOTAL) INTO THE MUSCLE EVERY 3 (THREE) MONTHS. 10/25/18   Glenice Bow, DO    Family History Family History  Problem Relation Age of Onset  . Hypertension Mother   . Cirrhosis Mother   . Alcohol abuse Mother     Social History Social History   Tobacco Use  . Smoking status: Former Smoker    Packs/day: 0.25    Years: 2.00    Pack years: 0.50    Types: Cigarettes    Quit date: 08/03/2017    Years since quitting: 1.9  . Smokeless tobacco: Former Network engineer Use Topics  . Alcohol use: No  . Drug use: No     Allergies   Patient has no known allergies.   Review of Systems Review of Systems   Physical Exam Triage Vital Signs ED Triage Vitals [07/22/19 1054]  Enc Vitals Group     BP (!) 145/82     Pulse Rate 62     Resp 15     Temp 98 F (36.7 C)     Temp Source Oral     SpO2 100 %     Weight      Height      Head Circumference      Peak Flow  Pain Score      Pain Loc      Pain Edu?      Excl. in [place]?    No data found.  Updated Vital Signs BP (!) 145/82 (BP Location: Right Arm)   Pulse 62   Temp 98 F (36.7 C) (Oral)   Resp 15   SpO2 100%   Visual Acuity Right Eye Distance:   Left Eye Distance:   Bilateral Distance:    Right Eye Near:   Left Eye Near:    Bilateral Near:     Physical Exam Vitals and nursing note reviewed.  Constitutional:      General: She is not in acute distress.    Appearance: Normal appearance. She is not ill-appearing, toxic-appearing or diaphoretic.  HENT:     Head: Normocephalic.     Nose: Nose normal.     Mouth/Throat:     Pharynx: Oropharynx is clear.  Eyes:     Conjunctiva/sclera: Conjunctivae normal.  Pulmonary:     Effort: Pulmonary effort is normal.  Abdominal:     Palpations: Abdomen is soft.     Tenderness: There is no abdominal tenderness.  Musculoskeletal:        General: Normal range of motion.     Cervical back: Normal range of motion.  Skin:    General: Skin is  warm and dry.     Findings: No rash.  Neurological:     Mental Status: She is alert.  Psychiatric:        Mood and Affect: Mood normal.      UC Treatments / Results  Labs (all labs ordered are listed, but only abnormal results are displayed) Labs Reviewed  CERVICOVAGINAL ANCILLARY ONLY    EKG   Radiology No results found.  Procedures Procedures (including critical care time)  Medications Ordered in UC Medications - No data to display  Initial Impression / Assessment and Plan / UC Course  I have reviewed the triage vital signs and the nursing notes.  Pertinent labs & imaging results that were available during my care of the patient were reviewed by me and considered in my medical decision making (see chart for details).     Vaginal discharge-swab sent for testing with labs pending.  We will treat based on results Final Clinical Impressions(s) / UC Diagnoses   Final diagnoses:  Vaginal discharge     Discharge Instructions     Sending your swab for testing and we will call with any positive results.    ED Prescriptions    None     PDMP not reviewed this encounter.   Janace Aris, NP 07/23/19 1044

## 2019-07-25 ENCOUNTER — Telehealth (HOSPITAL_COMMUNITY): Payer: Self-pay | Admitting: Emergency Medicine

## 2019-07-25 LAB — CERVICOVAGINAL ANCILLARY ONLY
Bacterial vaginitis: POSITIVE — AB
Chlamydia: NEGATIVE
Neisseria Gonorrhea: NEGATIVE

## 2019-07-25 MED ORDER — METRONIDAZOLE 500 MG PO TABS: 500.0000 mg | ORAL_TABLET | Freq: Two times a day (BID) | ORAL | 0 refills | Status: AC

## 2019-07-25 MED ORDER — FLUCONAZOLE 150 MG PO TABS: 150.0000 mg | ORAL_TABLET | Freq: Once | ORAL | 0 refills | Status: AC

## 2019-07-25 NOTE — Telephone Encounter (Signed)
Bacterial vaginosis is positive. This was not treated at the urgent care visit.  Flagyl 500 mg BID x 7 days #14 no refills sent to patients pharmacy of choice.    Testing for yeast and trich show indeterminate. Contacted pt to inform her, she is not concerned regarding trich. Requesting treatment for yeast as well. Per Traci, okay to send with flagyl.   Patient contacted by phone and made aware of    results. Pt verbalized understanding and had all questions answered.

## 2019-08-01 ENCOUNTER — Ambulatory Visit (HOSPITAL_COMMUNITY)
Admission: EM | Admit: 2019-08-01 | Discharge: 2019-08-01 | Disposition: A | Discharge status: Home / Self Care | Payer: Medicaid Other | Attending: Family Medicine | Admitting: Family Medicine

## 2019-08-01 ENCOUNTER — Other Ambulatory Visit: Payer: Self-pay

## 2019-08-01 ENCOUNTER — Encounter (HOSPITAL_COMMUNITY): Payer: Self-pay | Admitting: *Deleted

## 2019-08-01 DIAGNOSIS — B358 Other dermatophytoses: Secondary | ICD-10-CM

## 2019-08-01 MED ORDER — CLOTRIMAZOLE 1 % EX CREA: TOPICAL_CREAM | CUTANEOUS | 0 refills | Status: DC

## 2019-08-01 MED ORDER — FLUCONAZOLE 150 MG PO TABS: 150.0000 mg | ORAL_TABLET | Freq: Every day | ORAL | 0 refills | Status: DC

## 2019-08-01 NOTE — ED Provider Notes (Signed)
[facility]    CSN: [id number] Arrival date & time: 08/01/19  0847      History   Chief Complaint Chief Complaint  Patient presents with  . Rash    APPT 9a    HPI Sandra Anderson is a 29 y.o. female.   Patient is a 29 year old female who presents today with rash to facial area for the past few weeks.  Symptoms have been constant and spreading. She has not put anything on the rash.  Denies any significant pain or itching with the rash.   Denies any fever, joint pain. Denies any recent changes in lotions, detergents, foods or other possible irritants. No recent travel. Nobody else at home has the rash. Patient has been outside but denies any contact with plants or insects. No new foods or medications.   ROS per HPI        Past Medical History:  Diagnosis Date  . Chlamydia contact, treated   . Depression    pp depression after first and second children no counseling or meds  . Gestational diabetes 08/03/2017   diet controlled    Patient Active Problem List   Diagnosis Date Noted  . SVD (spontaneous vaginal delivery) 10/02/2017  . Gestational diabetes mellitus (GDM) affecting pregnancy 10/01/2017  . Positive GBS test 09/18/2017  . Impaired glucose tolerance test 08/15/2017  . Gestational diabetes 07/20/2017  . Schizophrenia ([facility]) 06/21/2017  . Maternal varicella, non-immune 06/21/2017  . Bipolar affective ([facility]) 06/21/2017  . ASCUS of cervix with negative high risk HPV 06/21/2017  . Supervision of high risk pregnancy, antepartum 03/03/2017  . History of ectopic pregnancy 03/03/2017  . History of prior pregnancy with IUGR newborn 03/03/2017    Past Surgical History:  Procedure Laterality Date  . MOUTH SURGERY      OB History    Gravida  5   Para  4   Term  4   Preterm  0   AB  1   Living  4     SAB  0   TAB  0   Ectopic  1   Multiple  0   Live Births  4            Home Medications    Prior to Admission medications     Medication Sig Start Date End Date Taking? Authorizing Provider  clotrimazole (LOTRIMIN) 1 % cream Apply to affected area 2 times daily 08/01/19   Dahlia Byes A, NP  fluconazole (DIFLUCAN) 150 MG tablet Take 1 tablet (150 mg total) by mouth daily. 08/01/19   Dahlia Byes A, NP  medroxyPROGESTERone (DEPO-PROVERA) 150 MG/ML injection INJECT 1 ML (150 MG TOTAL) INTO THE MUSCLE EVERY 3 (THREE) MONTHS. 10/25/18   Tamera Stands, DO  metroNIDAZOLE (FLAGYL) 500 MG tablet Take 1 tablet (500 mg total) by mouth 2 (two) times daily for 7 days. 07/25/19 08/01/19  Janace Aris, NP    Family History Family History  Problem Relation Age of Onset  . Hypertension Mother   . Cirrhosis Mother   . Alcohol abuse Mother     Social History Social History   Tobacco Use  . Smoking status: Former Smoker    Packs/day: 0.25    Years: 2.00    Pack years: 0.50    Types: Cigarettes    Quit date: 08/03/2017    Years since quitting: 1.9  . Smokeless tobacco: Former Engineer, water Use Topics  . Alcohol use: No  . Drug use: No  Allergies   Patient has no known allergies.   Review of Systems Review of Systems   Physical Exam Triage Vital Signs ED Triage Vitals [08/01/19 0920]  Enc Vitals Group     BP      Pulse      Resp      Temp      Temp src      SpO2      Weight      Height      Head Circumference      Peak Flow      Pain Score 0     Pain Loc      Pain Edu?      Excl. in [place]?    No data found.  Updated Vital Signs There were no vitals taken for this visit.  Visual Acuity Right Eye Distance:   Left Eye Distance:   Bilateral Distance:    Right Eye Near:   Left Eye Near:    Bilateral Near:     Physical Exam Vitals and nursing note reviewed.  Constitutional:      General: She is not in acute distress.    Appearance: Normal appearance. She is not ill-appearing, toxic-appearing or diaphoretic.  HENT:     Head: Normocephalic.     Nose: Nose normal.     Mouth/Throat:      Pharynx: Oropharynx is clear.  Eyes:     Conjunctiva/sclera: Conjunctivae normal.  Pulmonary:     Effort: Pulmonary effort is normal.  Musculoskeletal:        General: Normal range of motion.     Cervical back: Normal range of motion.  Skin:    General: Skin is warm and dry.     Findings: Rash present.     Comments: 3 raised circular patches to face.  Neurological:     Mental Status: She is alert.  Psychiatric:        Mood and Affect: Mood normal.      UC Treatments / Results  Labs (all labs ordered are listed, but only abnormal results are displayed) Labs Reviewed - No data to display  EKG   Radiology No results found.  Procedures Procedures (including critical care time)  Medications Ordered in UC Medications - No data to display  Initial Impression / Assessment and Plan / UC Course  I have reviewed the triage vital signs and the nursing notes.  Pertinent labs & imaging results that were available during my care of the patient were reviewed by me and considered in my medical decision making (see chart for details).     Tinea faciale-treating with clotrimazole cream and Diflucan  Final Clinical Impressions(s) / UC Diagnoses   Final diagnoses:  Tinea faciale     Discharge Instructions     Use a very small amount of the cream on each spot twice a day.  Take 1 tab weekly for the next 2 weeks. Follow up as needed for continued or worsening symptoms     ED Prescriptions    Medication Sig Dispense Auth. Provider   fluconazole (DIFLUCAN) 150 MG tablet Take 1 tablet (150 mg total) by mouth daily. 2 tablet Sahej Schrieber A, NP   clotrimazole (LOTRIMIN) 1 % cream Apply to affected area 2 times daily 15 g Lliam Hoh A, NP     PDMP not reviewed this encounter.   Orvan July, NP 08/01/19 1319

## 2019-08-01 NOTE — Discharge Instructions (Addendum)
Use a very small amount of the cream on each spot twice a day.  Take 1 tab weekly for the next 2 weeks. Follow up as needed for continued or worsening symptoms

## 2019-08-01 NOTE — ED Triage Notes (Signed)
Patient reports she has 3 spots on her face since the end of December. Patient wants to make sure the spots are not ring worm or anything she needs to be concerned about. No itching to the area.

## 2019-08-21 ENCOUNTER — Ambulatory Visit (HOSPITAL_COMMUNITY)
Admission: EM | Admit: 2019-08-21 | Discharge: 2019-08-21 | Disposition: A | Discharge status: Home / Self Care | Payer: Medicaid Other

## 2019-08-21 ENCOUNTER — Other Ambulatory Visit: Payer: Self-pay

## 2019-08-21 NOTE — ED Notes (Signed)
Patient states she received a phone call and needs to leave for a family emergency.  Will return later today.

## 2019-08-22 ENCOUNTER — Other Ambulatory Visit: Payer: Self-pay

## 2019-08-22 ENCOUNTER — Encounter (HOSPITAL_COMMUNITY): Payer: Self-pay | Admitting: Emergency Medicine

## 2019-08-22 ENCOUNTER — Ambulatory Visit (HOSPITAL_COMMUNITY)
Admission: EM | Admit: 2019-08-22 | Discharge: 2019-08-22 | Disposition: A | Discharge status: Home / Self Care | Payer: Medicaid Other | Attending: Internal Medicine | Admitting: Internal Medicine

## 2019-08-22 DIAGNOSIS — L818 Other specified disorders of pigmentation: Secondary | ICD-10-CM

## 2019-08-22 DIAGNOSIS — Z3202 Encounter for pregnancy test, result negative: Secondary | ICD-10-CM | POA: Diagnosis not present

## 2019-08-22 LAB — POC URINE PREG, ED: Preg Test, Ur: NEGATIVE

## 2019-08-22 LAB — POCT PREGNANCY, URINE: Preg Test, Ur: NEGATIVE

## 2019-08-22 NOTE — ED Triage Notes (Addendum)
Patient seen 08/01/2019.  Patient was seen for ringworm on face.  Patient reports its cleared, but "spot" remains on forehead  Patient reports using cream prescribed. Then she went to store and bought cream and has been applying creams since visit on 08/01/2019   Patient has a dark area of skin under right arm that she is asking about

## 2019-08-22 NOTE — ED Provider Notes (Signed)
[facility]    CSN: [id number] Arrival date & time: 08/22/19  0841      History   Chief Complaint Chief Complaint  Patient presents with  . Rash    HPI Sandra Anderson is a 29 y.o. female recently treated for tinea infection involving the forehead comes to urgent care with complaints of hypopigmented lesion in the area of tinea infection.  Patient was treated with clotrimazole cream and topical steroid.  Patient has used the steroid cream over the past 3 weeks.  She admits that the skin rash and itchiness has subsided.  She noticed decreased pigmentation in the area affected by fungal infection.  No erythema.   HPI  Past Medical History:  Diagnosis Date  . Chlamydia contact, treated   . Depression    pp depression after first and second children no counseling or meds    Patient Active Problem List   Diagnosis Date Noted  . SVD (spontaneous vaginal delivery) 10/02/2017  . Gestational diabetes mellitus (GDM) affecting pregnancy 10/01/2017  . Positive GBS test 09/18/2017  . Impaired glucose tolerance test 08/15/2017  . Gestational diabetes 07/20/2017  . Schizophrenia ([facility]) 06/21/2017  . Maternal varicella, non-immune 06/21/2017  . Bipolar affective ([facility]) 06/21/2017  . ASCUS of cervix with negative high risk HPV 06/21/2017  . Supervision of high risk pregnancy, antepartum 03/03/2017  . History of ectopic pregnancy 03/03/2017  . History of prior pregnancy with IUGR newborn 03/03/2017    Past Surgical History:  Procedure Laterality Date  . MOUTH SURGERY      OB History    Gravida  5   Para  4   Term  4   Preterm  0   AB  1   Living  4     SAB  0   TAB  0   Ectopic  1   Multiple  0   Live Births  4            Home Medications    Prior to Admission medications   Medication Sig Start Date End Date Taking? Authorizing Provider  clotrimazole (LOTRIMIN) 1 % cream Apply to affected area 2 times daily 08/01/19   Dahlia Byes A, NP    medroxyPROGESTERone (DEPO-PROVERA) 150 MG/ML injection INJECT 1 ML (150 MG TOTAL) INTO THE MUSCLE EVERY 3 (THREE) MONTHS. 10/25/18 08/22/19  Tamera Stands, DO    Family History Family History  Problem Relation Age of Onset  . Hypertension Mother   . Cirrhosis Mother   . Alcohol abuse Mother     Social History Social History   Tobacco Use  . Smoking status: Former Smoker    Packs/day: 0.25    Years: 2.00    Pack years: 0.50    Types: Cigarettes    Quit date: 08/03/2017    Years since quitting: 2.0  . Smokeless tobacco: Former Engineer, water Use Topics  . Alcohol use: No  . Drug use: No     Allergies   Patient has no known allergies.   Review of Systems Review of Systems  Constitutional: Negative for appetite change, fatigue and fever.  Skin: Positive for pallor and rash.     Physical Exam Triage Vital Signs ED Triage Vitals  Enc Vitals Group     BP 08/22/19 0912 123/86     Pulse Rate 08/22/19 0912 72     Resp 08/22/19 0912 18     Temp 08/22/19 0912 98.1 F (36.7 C)  Temp Source 08/22/19 0912 Oral     SpO2 08/22/19 0912 98 %     Weight --      Height --      Head Circumference --      Peak Flow --      Pain Score 08/22/19 0908 0     Pain Loc --      Pain Edu? --      Excl. in [place]? --    No data found.  Updated Vital Signs BP 123/86 (BP Location: Left Arm)   Pulse 72   Temp 98.1 F (36.7 C) (Oral)   Resp 18   SpO2 98%   Visual Acuity Right Eye Distance:   Left Eye Distance:   Bilateral Distance:    Right Eye Near:   Left Eye Near:    Bilateral Near:     Physical Exam Vitals and nursing note reviewed.  HENT:     Head: Normocephalic.     Right Ear: Tympanic membrane normal.  Cardiovascular:     Rate and Rhythm: Normal rate and regular rhythm.     Pulses: Normal pulses.     Heart sounds: Normal heart sounds.  Skin:    General: Skin is warm.     Findings: No erythema, lesion or rash.     Comments: Well-defined hypopigmented  lesion.  Lesion is circular in nature.  Smooth at the base and at the margins.  No rash noted.  It is nontender.  Neurological:     Mental Status: She is alert.      UC Treatments / Results  Labs (all labs ordered are listed, but only abnormal results are displayed) Labs Reviewed  POCT PREGNANCY, URINE  POC URINE PREG, ED    EKG   Radiology No results found.  Procedures Procedures (including critical care time)  Medications Ordered in UC Medications - No data to display  Initial Impression / Assessment and Plan / UC Course  I have reviewed the triage vital signs and the nursing notes.  Pertinent labs & imaging results that were available during my care of the patient were reviewed by me and considered in my medical decision making (see chart for details).     1.  Postinflammatory hypopigmentation: Patient is advised to discontinue steroid use as well as antifungal use. I expect the skin to regain its normal pigmentation after couple of weeks If patient skin condition does not improve she may benefit from dermatology evaluation. Final Clinical Impressions(s) / UC Diagnoses   Final diagnoses:  Post inflammatory hypopigmentation     Discharge Instructions     Discontinue steroid cream use.  Apply sun screen to area Skin will be fully pigmented in 2-3 weeks    ED Prescriptions    None     PDMP not reviewed this encounter.   Chase Picket, MD 08/22/19 1003

## 2019-08-22 NOTE — Discharge Instructions (Addendum)
Discontinue steroid cream use.  Apply sun screen to area Skin will be fully pigmented in 2-3 weeks

## 2019-08-22 NOTE — ED Notes (Signed)
Patient requested pregnancy test.  Last depo shot was October 2020

## 2019-08-29 ENCOUNTER — Ambulatory Visit (HOSPITAL_COMMUNITY)
Admission: EM | Admit: 2019-08-29 | Discharge: 2019-08-29 | Discharge status: Against Medical Advice | Payer: Medicaid Other

## 2019-08-29 ENCOUNTER — Other Ambulatory Visit: Payer: Self-pay

## 2019-08-30 ENCOUNTER — Ambulatory Visit (HOSPITAL_COMMUNITY)
Admission: EM | Admit: 2019-08-30 | Discharge: 2019-08-30 | Disposition: A | Discharge status: Home / Self Care | Payer: Medicaid Other | Attending: Family Medicine | Admitting: Family Medicine

## 2019-08-30 ENCOUNTER — Other Ambulatory Visit: Payer: Self-pay

## 2019-08-30 ENCOUNTER — Encounter (HOSPITAL_COMMUNITY): Payer: Self-pay

## 2019-08-30 DIAGNOSIS — N898 Other specified noninflammatory disorders of vagina: Secondary | ICD-10-CM | POA: Diagnosis not present

## 2019-08-30 MED ORDER — NYSTATIN-TRIAMCINOLONE 100000-0.1 UNIT/GM-% EX CREA: TOPICAL_CREAM | CUTANEOUS | 0 refills | Status: DC

## 2019-08-30 MED ORDER — FLUCONAZOLE 150 MG PO TABS: 150.0000 mg | ORAL_TABLET | Freq: Every day | ORAL | 0 refills | Status: DC

## 2019-08-30 NOTE — Discharge Instructions (Addendum)
Take the medication as prescribed We will send a swab for testing and call you with any positive results. Refrain from sexual activity until feeling better Only use mild unscented soaps and vaginal area when cleaning

## 2019-08-30 NOTE — ED Triage Notes (Signed)
Pt reports having pain during intercourse x 3-4 days. Pt denies any vaginal discharge, burning sensation when urinating.

## 2019-09-01 NOTE — ED Provider Notes (Signed)
[place]    CSN: [id number] Arrival date & time: 08/30/19  0827      History   Chief Complaint Chief Complaint  Patient presents with  . Dyspareunia    HPI Sandra Anderson is a 29 y.o. female.   Pt is a 29 year old female that presents with vaginal itching, irritation, dyspareunia. This has been present  for the past few days. No vaginal discharge. No abdominal pain, back pain, fever. Some mild dysuria due to the vaginal irritation. She believes she has a yeast infection. Hx of similar symptoms with same.   ROS per HPI      Past Medical History:  Diagnosis Date  . Chlamydia contact, treated   . Depression    pp depression after first and second children no counseling or meds    Patient Active Problem List   Diagnosis Date Noted  . SVD (spontaneous vaginal delivery) 10/02/2017  . Gestational diabetes mellitus (GDM) affecting pregnancy 10/01/2017  . Positive GBS test 09/18/2017  . Impaired glucose tolerance test 08/15/2017  . Gestational diabetes 07/20/2017  . Schizophrenia ([place]) 06/21/2017  . Maternal varicella, non-immune 06/21/2017  . Bipolar affective ([place]) 06/21/2017  . ASCUS of cervix with negative high risk HPV 06/21/2017  . Supervision of high risk pregnancy, antepartum 03/03/2017  . History of ectopic pregnancy 03/03/2017  . History of prior pregnancy with IUGR newborn 03/03/2017    Past Surgical History:  Procedure Laterality Date  . MOUTH SURGERY      OB History    Gravida  5   Para  4   Term  4   Preterm  0   AB  1   Living  4     SAB  0   TAB  0   Ectopic  1   Multiple  0   Live Births  4            Home Medications    Prior to Admission medications   Medication Sig Start Date End Date Taking? Authorizing Provider  medroxyPROGESTERone (DEPO-PROVERA) 150 MG/ML injection Inject 150 mg into the muscle every 3 (three) months.   Yes [provider]  clotrimazole (LOTRIMIN) 1 % cream Apply to affected  area 2 times daily 08/01/19   Loura Halt A, NP  fluconazole (DIFLUCAN) 150 MG tablet Take 1 tablet (150 mg total) by mouth daily. 08/30/19   Orvan July, NP  nystatin-triamcinolone (MYCOLOG II) cream Apply to affected area daily 08/30/19   Orvan July, NP    Family History Family History  Problem Relation Age of Onset  . Hypertension Mother   . Cirrhosis Mother   . Alcohol abuse Mother     Social History Social History   Tobacco Use  . Smoking status: Former Smoker    Packs/day: 0.25    Years: 2.00    Pack years: 0.50    Types: Cigarettes    Quit date: 08/03/2017    Years since quitting: 2.0  . Smokeless tobacco: Former Network engineer Use Topics  . Alcohol use: No  . Drug use: No     Allergies   Patient has no known allergies.   Review of Systems Review of Systems   Physical Exam Triage Vital Signs ED Triage Vitals  Enc Vitals Group     BP 08/30/19 0840 133/81     Pulse Rate 08/30/19 0840 70     Resp 08/30/19 0840 17     Temp 08/30/19 0840 98.6  F (37 C)     Temp Source 08/30/19 0840 Oral     SpO2 08/30/19 0840 97 %     Weight --      Height --      Head Circumference --      Peak Flow --      Pain Score 08/30/19 0838 8     Pain Loc --      Pain Edu? --      Excl. in [place]? --    No data found.  Updated Vital Signs BP 133/81 (BP Location: Right Arm)   Pulse 70   Temp 98.6 F (37 C) (Oral)   Resp 17   SpO2 97%   Visual Acuity Right Eye Distance:   Left Eye Distance:   Bilateral Distance:    Right Eye Near:   Left Eye Near:    Bilateral Near:     Physical Exam Vitals and nursing note reviewed.  Constitutional:      General: She is not in acute distress.    Appearance: Normal appearance. She is not ill-appearing, toxic-appearing or diaphoretic.  HENT:     Head: Normocephalic.     Nose: Nose normal.  Eyes:     Conjunctiva/sclera: Conjunctivae normal.  Pulmonary:     Effort: Pulmonary effort is normal.  Musculoskeletal:        General:  Normal range of motion.     Cervical back: Normal range of motion.  Skin:    General: Skin is warm and dry.     Findings: No rash.  Neurological:     Mental Status: She is alert.  Psychiatric:        Mood and Affect: Mood normal.      UC Treatments / Results  Labs (all labs ordered are listed, but only abnormal results are displayed) Labs Reviewed  CERVICOVAGINAL ANCILLARY ONLY    EKG   Radiology No results found.  Procedures Procedures (including critical care time)  Medications Ordered in UC Medications - No data to display  Initial Impression / Assessment and Plan / UC Course  I have reviewed the triage vital signs and the nursing notes.  Pertinent labs & imaging results that were available during my care of the patient were reviewed by me and considered in my medical decision making (see chart for details).     Vaginal irritation- treating for yeast infection.  Swab sent for testing.  Final Clinical Impressions(s) / UC Diagnoses   Final diagnoses:  Vaginal irritation     Discharge Instructions     Take the medication as prescribed We will send a swab for testing and call you with any positive results. Refrain from sexual activity until feeling better Only use mild unscented soaps and vaginal area when cleaning    ED Prescriptions    Medication Sig Dispense Auth. Provider   fluconazole (DIFLUCAN) 150 MG tablet Take 1 tablet (150 mg total) by mouth daily. 2 tablet Mardelle Pandolfi A, NP   nystatin-triamcinolone (MYCOLOG II) cream Apply to affected area daily 15 g Rosaleigh Brazzel A, NP     PDMP not reviewed this encounter.   Dahlia Byes A, NP 09/01/19 1351

## 2019-09-04 LAB — CERVICOVAGINAL ANCILLARY ONLY
Bacterial vaginitis: NEGATIVE
Candida vaginitis: POSITIVE — AB
Chlamydia: NEGATIVE
Neisseria Gonorrhea: NEGATIVE
Trichomonas: NEGATIVE

## 2019-11-29 ENCOUNTER — Other Ambulatory Visit: Payer: Self-pay

## 2019-11-29 ENCOUNTER — Other Ambulatory Visit (HOSPITAL_COMMUNITY)
Admission: RE | Admit: 2019-11-29 | Discharge: 2019-11-29 | Disposition: A | Discharge status: Home / Self Care | Payer: Medicaid Other | Source: Ambulatory Visit | Attending: Family Medicine | Admitting: Family Medicine

## 2019-11-29 ENCOUNTER — Ambulatory Visit (INDEPENDENT_AMBULATORY_CARE_PROVIDER_SITE_OTHER): Payer: Medicaid Other | Admitting: Student in an Organized Health Care Education/Training Program

## 2019-11-29 VITALS — BP 120/80 | HR 49 | Ht 67.0 in | Wt 195.6 lb

## 2019-11-29 DIAGNOSIS — Z7689 Persons encountering health services in other specified circumstances: Secondary | ICD-10-CM | POA: Diagnosis not present

## 2019-11-29 DIAGNOSIS — Z3042 Encounter for surveillance of injectable contraceptive: Secondary | ICD-10-CM | POA: Diagnosis present

## 2019-11-29 DIAGNOSIS — Z3009 Encounter for other general counseling and advice on contraception: Secondary | ICD-10-CM

## 2019-11-29 DIAGNOSIS — N898 Other specified noninflammatory disorders of vagina: Secondary | ICD-10-CM | POA: Diagnosis not present

## 2019-11-29 LAB — POCT WET PREP (WET MOUNT)
Clue Cells Wet Prep Whiff POC: NEGATIVE
Trichomonas Wet Prep HPF POC: ABSENT

## 2019-11-29 LAB — POCT URINE PREGNANCY: Preg Test, Ur: NEGATIVE

## 2019-11-29 MED ORDER — PRENATAL VITAMIN 27-0.8 MG PO TABS: 1.0000 | ORAL_TABLET | Freq: Every day | ORAL | 2 refills | Status: DC

## 2019-11-29 NOTE — Progress Notes (Signed)
Patient given PHQ2 and 9.   Provider aware.  .Puneet Masoner R Akeyla Molden, CMA  

## 2019-11-29 NOTE — Patient Instructions (Signed)
It was a pleasure to see you today!  To summarize our discussion for this visit:  Pregnancy test today. As well as testing for yeast/bacterial vaginosis  Please start taking a prenatal vitamin and consider starting a birth control if you are not desiring a pregnancy at this time. I've given some information here  Please sign a release of records form so we can see your Pap results and to know when you are due for the next screening.  Some additional health maintenance measures we should update are: Health Maintenance Due  Topic Date Due  . COVID-19 Vaccine (1) Never done  . TETANUS/TDAP  Never done  . PAP-Cervical Cytology Screening  Never done  . PAP SMEAR-Modifier  Never done  .    Please return to our clinic to see me as needed.  Call the clinic at [phone] if your symptoms worsen or you have any concerns.   Thank you for allowing me to take part in your care,  Dr. Jamelle Rushing   Intrauterine Device Information An intrauterine device (IUD) is a medical device that is inserted in the uterus to prevent pregnancy. It is a small, T-shaped device that has one or two nylon strings hanging down from it. The strings hang out of the lower part of the uterus (cervix) to allow for future IUD removal. There are two types of IUDs available:  Hormone IUD. This type of IUD is made of plastic and contains the hormone progestin (synthetic progesterone). A hormone IUD may last 3-5 years.  Copper IUD. This type of IUD has copper wire wrapped around it. A copper IUD may last up to 10 years. How is an IUD inserted? An IUD is inserted through the vagina and placed into the uterus with a minor medical procedure. The exact procedure for IUD insertion may vary among health care providers and hospitals. How does an IUD work? Synthetic progesterone in a hormonal IUD prevents pregnancy by:  Thickening cervical mucus to prevent sperm from entering the uterus.  Thinning the uterine lining to  prevent a fertilized egg from being implanted there. Copper in a copper IUD prevents pregnancy by making the uterus and fallopian tubes produce a fluid that kills sperm. What are the advantages of an IUD? Advantages of either type of IUD  It is highly effective in preventing pregnancy.  It is reversible. You can become pregnant shortly after the IUD is removed.  It is low-maintenance and can stay in place for a long time.  There are no estrogen-related side effects.  It can be used when breastfeeding.  It is not associated with weight gain.  It can be inserted right after childbirth, an abortion, or a miscarriage. Advantages of a hormone IUD  If it is inserted within 7 days of your period starting, it works right after it is inserted. If the hormone IUD is inserted at any other time in your cycle, you will need to use a backup method of birth control for 7 days after insertion.  It can make menstrual periods lighter.  It can reduce menstrual cramping.  It can be used for 3-5 years. Advantages of a copper IUD  It works right after it is inserted.  It can be used as a form of emergency birth control if it is inserted within 5 days after having unprotected sex.  It does not interfere with your body's natural hormones.  It can be used for 10 years. What are the disadvantages of an IUD?  An  IUD may cause irregular menstrual bleeding for a period of time after insertion.  You may have pain during insertion and have cramping and vaginal bleeding after insertion.  An IUD may cut the uterus (uterine perforation) when it is inserted. This is rare.  An IUD may cause pelvic inflammatory disease (PID), which is an infection in the uterus and fallopian tubes. This is rare, and it usually happens during the first 20 days after the IUD is inserted.  A copper IUD can make your menstrual flow heavier and more painful. How is an IUD removed?  You will lie on your back with your knees  bent and your feet in footrests (stirrups).  A device will be inserted into your vagina to spread apart the vaginal walls (speculum). This will allow your health care provider to see the strings attached to the IUD.  Your health care provider will use a small instrument (forceps) to grasp the IUD strings and pull firmly until the IUD is removed. You may have some discomfort when the IUD is removed. Your health care provider may recommend taking over-the-counter pain relievers, such as ibuprofen, before the procedure. You may also have minor spotting for a few days after the procedure. The exact procedure for IUD removal may vary among health care providers and hospitals. Is the IUD right for me? Your health care provider will make sure you are a good candidate for an IUD and will discuss the advantages, disadvantages, and possible side effects with you. Summary  An intrauterine device (IUD) is a medical device that is inserted in the uterus to prevent pregnancy. It is a small, T-shaped device that has one or two nylon strings hanging down from it.  A hormone IUD contains the hormone progestin (synthetic progesterone). A copper IUD has copper wire wrapped around it.  Synthetic progesterone in a hormone IUD prevents pregnancy by thickening cervical mucus and thinning the walls of the uterus. Copper in a copper IUD prevents pregnancy by making the uterus and fallopian tubes produce a fluid that kills sperm.  A hormone IUD can be left in place for 3-5 years. A copper IUD can be left in place for up to 10 years.  An IUD is inserted and removed by a health care provider. You may feel some pain during insertion and removal. Your health care provider may recommend taking over-the-counter pain medicine, such as ibuprofen, before an IUD procedure. This information is not intended to replace advice given to you by your health care provider. Make sure you discuss any questions you have with your health care  provider. Document Revised: 06/23/2017 Document Reviewed: 08/09/2016 Elsevier Patient Education  [place].

## 2019-12-02 ENCOUNTER — Telehealth: Payer: Self-pay

## 2019-12-02 LAB — CERVICOVAGINAL ANCILLARY ONLY
Chlamydia: NEGATIVE
Comment: NEGATIVE
Comment: NORMAL
Neisseria Gonorrhea: NEGATIVE

## 2019-12-02 NOTE — Telephone Encounter (Signed)
Patient calls nurse line requesting lab results from recent visit. Informed patient of negative STD results. Patient would like treatment for positive yeast. Patient stated she typically take diflucan and this works well for her. [facility] [employer] [facility].

## 2019-12-04 DIAGNOSIS — Z3009 Encounter for other general counseling and advice on contraception: Secondary | ICD-10-CM | POA: Insufficient documentation

## 2019-12-04 DIAGNOSIS — N898 Other specified noninflammatory disorders of vagina: Secondary | ICD-10-CM | POA: Insufficient documentation

## 2019-12-04 DIAGNOSIS — Z113 Encounter for screening for infections with a predominantly sexual mode of transmission: Secondary | ICD-10-CM | POA: Insufficient documentation

## 2019-12-04 DIAGNOSIS — Z7689 Persons encountering health services in other specified circumstances: Secondary | ICD-10-CM | POA: Insufficient documentation

## 2019-12-04 NOTE — Assessment & Plan Note (Addendum)
Denies significant past medical history. Chart review reveals: Bipolar, schizophrenia, [id number], ectopic pregnancy, gestational diabetes, chlamydia, ASCUS. Requested patient sign record release for previous provider.

## 2019-12-04 NOTE — Progress Notes (Signed)
    SUBJECTIVE:   CHIEF COMPLAINT / HPI: establish care.   28yo pleasant female with no significant past medical history or surgeries and takes no medications other than occasional multivitamin is here to establish care.   Previously on depo injections for contraception. Is sexually active with one female partner and not using any form of protection. LMP about a month ago and was lighter than usual. She is usually pretty regular. Has positive breast tenderness and occasional nausea but no vomiting. Not desiring pregnancy at this time and thought that her depo shot was still active from last October. She does not want another depo shot today. Would like to discuss contraception options and come back. She does not know when she has a pap last but thinks it was about a year ago and was normal. Denies history of STDs and would like to be checked for yeast today due to 1 day increased clear/white vaginal discharge without pruritus.   Family history positive for HTN.   PERTINENT  PMH / PSH: new patient  OBJECTIVE:   BP 120/80   Pulse (!) 49   Ht 5\' 7"  (1.702 m)   Wt 195 lb 9.6 oz (88.7 kg)   SpO2 99%   BMI 30.64 kg/m   General: NAD, pleasant, able to participate in exam Cardiac: RRR, normal heart sounds, no murmurs. 2+ radial and PT pulses bilaterally Respiratory: CTAB, normal effort, No wheezes, rales or rhonchi Abdomen: soft, nontender, nondistended, no hepatic or splenomegaly, +BS Pelvic: negative for external lesions. Vaginal vault positive for thick white discharge and clear/green-tinged mucous from cervical os. No lesions noted. Extremities: no edema or cyanosis. WWP. Skin: warm and dry, no rashes noted Neuro: alert and oriented x4, no focal deficits Psych: Normal affect and mood   ASSESSMENT/PLAN:   Establishing care with new doctor, encounter for Denies significant past medical history. Chart review reveals: Bipolar, schizophrenia, , ectopic pregnancy, gestational  diabetes, chlamydia, ASCUS. Requested patient sign record release for previous provider.   Birth control counseling Counseled patient on safe sex practices and pregnancy prevention if not desired this time.  Recommended and prescribed prenatal vitamin.  Provided with handout on contraception options Urine pregnancy test negative today   Vaginal discharge Performed pelvic exam with wet prep and GC/chlamydia screening    [id number], DO [facility]

## 2019-12-04 NOTE — Assessment & Plan Note (Addendum)
Counseled patient on safe sex practices and pregnancy prevention if not desired this time.  Recommended and prescribed prenatal vitamin.  Provided with handout on contraception options Urine pregnancy test negative today

## 2019-12-04 NOTE — Assessment & Plan Note (Signed)
Performed pelvic exam with wet prep and GC/chlamydia screening

## 2019-12-05 ENCOUNTER — Other Ambulatory Visit: Payer: Self-pay | Admitting: Student in an Organized Health Care Education/Training Program

## 2019-12-05 MED ORDER — FLUCONAZOLE 150 MG PO TABS: 150.0000 mg | ORAL_TABLET | Freq: Once | ORAL | 0 refills | Status: AC

## 2019-12-05 NOTE — Progress Notes (Signed)
Prescribed diflucan x1.  Discussed indication and alternatives with patient. Informed of lab results.  She is still deciding on contraceptive kind so will call back for appointment.

## 2019-12-05 NOTE — Telephone Encounter (Signed)
Patient calls nurse line regarding recent OV lab results and requesting medication. Patient was seen on 11/29/19.   Spoke with Dr. Manson Passey regarding patient. Paging PCP at this time for next steps.   To PCP  Veronda Prude, RN

## 2020-03-10 ENCOUNTER — Other Ambulatory Visit: Payer: Self-pay

## 2020-03-10 ENCOUNTER — Ambulatory Visit (INDEPENDENT_AMBULATORY_CARE_PROVIDER_SITE_OTHER): Payer: Medicaid Other | Admitting: Family Medicine

## 2020-03-10 ENCOUNTER — Other Ambulatory Visit (HOSPITAL_COMMUNITY)
Admission: RE | Admit: 2020-03-10 | Discharge: 2020-03-10 | Disposition: A | Discharge status: Home / Self Care | Payer: Medicaid Other | Source: Ambulatory Visit | Attending: Family Medicine | Admitting: Family Medicine

## 2020-03-10 VITALS — BP 118/82 | HR 57 | Ht 67.0 in | Wt 187.8 lb

## 2020-03-10 DIAGNOSIS — N760 Acute vaginitis: Secondary | ICD-10-CM | POA: Insufficient documentation

## 2020-03-10 DIAGNOSIS — F319 Bipolar disorder, unspecified: Secondary | ICD-10-CM | POA: Diagnosis not present

## 2020-03-10 LAB — POCT WET PREP (WET MOUNT)
Clue Cells Wet Prep Whiff POC: NEGATIVE
Trichomonas Wet Prep HPF POC: ABSENT

## 2020-03-10 MED ORDER — FLUCONAZOLE 150 MG PO TABS: 150.0000 mg | ORAL_TABLET | Freq: Once | ORAL | 0 refills | Status: AC

## 2020-03-10 NOTE — Progress Notes (Signed)
    SUBJECTIVE:   CHIEF COMPLAINT / HPI:   Vaginitis Patient presenting with vaginal itching. She denies dysuria, vaginal odor and discharge. She declines STD testing today.   PERTINENT  PMH / PSH: Hx of ASCUS of cervix with negative high risk HPV;    OBJECTIVE:   BP 118/82   Pulse (!) 57   Ht 5\' 7"  (1.702 m)   Wt 187 lb 12.8 oz (85.2 kg)   LMP 02/07/2020 (Approximate)   SpO2 98%   BMI 29.41 kg/m   General: well appearing female, NAD  GU: External vulva and vagina nonerythematous, without any obvious lesions or rash.  thick white discharge appreciated.Normal ruggae of vaginal walls.  Cervix is non erythematous and non-friable.  There is no cervical motion tenderness, masses or gross abnormalities appreciated during bimanual exam.    ASSESSMENT/PLAN:   Bipolar affective (HCC) On PHQ-9 today, patient has answered 1 for question #9.  I followed up with her via telephone call and she denies any suicidal ideation at this time.  She does not have any history of suicidal planning.  And she does not have any plan.  She reports that suicide is a fleeting thought that goes to her head but would never otherwise hurt herself.  She follows with [facility] for her history of bipolar and schizophrenia.  She was last seen by them last week on a telemed call.  She reports stability in her mental health at this time.  Vaginitis Yeast positive on wet prep today. Will send diflucan to the pharmacy x 1. PAP also collected    02/09/2020, MD [facility]

## 2020-03-10 NOTE — Assessment & Plan Note (Signed)
On PHQ-9 today, patient has answered 1 for question #9.  I followed up with her via telephone call and she denies any suicidal ideation at this time.  She does not have any history of suicidal planning.  And she does not have any plan.  She reports that suicide is a fleeting thought that goes to her head but would never otherwise hurt herself.  She follows with [facility] for her history of bipolar and schizophrenia.  She was last seen by them last week on a telemed call.  She reports stability in her mental health at this time.

## 2020-03-11 LAB — CYTOLOGY - PAP
Chlamydia: NEGATIVE
Comment: NEGATIVE
Comment: NORMAL
Diagnosis: NEGATIVE
Neisseria Gonorrhea: NEGATIVE

## 2020-03-13 DIAGNOSIS — N760 Acute vaginitis: Secondary | ICD-10-CM | POA: Insufficient documentation

## 2020-03-13 NOTE — Assessment & Plan Note (Addendum)
Yeast positive on wet prep today. Will send diflucan to the pharmacy x 1. PAP also collected

## 2020-04-06 ENCOUNTER — Inpatient Hospital Stay (HOSPITAL_COMMUNITY)
Admission: AD | Admit: 2020-04-06 | Discharge: 2020-04-06 | Disposition: A | Discharge status: Home / Self Care | Payer: Medicaid Other | Attending: Obstetrics and Gynecology | Admitting: Obstetrics and Gynecology

## 2020-04-06 DIAGNOSIS — L989 Disorder of the skin and subcutaneous tissue, unspecified: Secondary | ICD-10-CM

## 2020-04-06 NOTE — MAU Note (Signed)
Noticed a 'bubble' on the clit in the middle.  Not painful.  No drainage or bleeding from it that she knows.  Not preg

## 2020-04-06 NOTE — MAU Note (Signed)
Pt seen in Triage by J.Walkser,CNM. examined labia and instructed to use warm compress to area. If it does not improve she needs to go to [place] office. Pt verbalized understanding.

## 2020-04-06 NOTE — MAU Provider Note (Signed)
S Ms. Sandra Anderson is a 29 y.o. [phone] non-pregnant female who presents to MAU today with complaint of a "bubble on her clitoris." She is not in pain, the area is not irritated, she is just concerned   O BP 136/88 (BP Location: Right Arm)    Pulse (!) 54    Temp 98.7 F (37.1 C) (Oral)    Resp 16    LMP 04/05/2020    SpO2 100%  Physical Exam Vitals and nursing note reviewed.  Constitutional:      Appearance: Normal appearance. She is normal weight.  Eyes:     Pupils: Pupils are equal, round, and reactive to light.  Cardiovascular:     Rate and Rhythm: Normal rate.     Pulses: Normal pulses.  Pulmonary:     Effort: Pulmonary effort is normal.  Genitourinary:    Comments: Left labia minora near the clitoris has a small pedunculated area that is edematous into a perfect 1/2cm bubble. No pus noted, area is congruent with the color of the pt's labia and completely painless. There is no blistered area, no open area, no drainage. Musculoskeletal:        General: Normal range of motion.  Skin:    General: Skin is warm and dry.     Capillary Refill: Capillary refill takes less than 2 seconds.  Neurological:     Mental Status: She is alert and oriented to person, place, and time.  Psychiatric:        Mood and Affect: Mood normal.        Behavior: Behavior normal.        Thought Content: Thought content normal.        Judgment: Judgment normal.     A Non pregnant female Medical screening exam complete Abnormal growth on labia, potentially related to irritation of pad and pubic hair  P Advised pt to soak in epsom salt compress or bath and follow up with regular GYN provider Discharge from MAU in stable condition  Warning signs for worsening condition that would warrant emergency follow-up discussed Patient may return to MAU as needed for emergent gyn or pregnancy related complaints  Edd Arbour, CNM, MSN, [facility] 04/06/20 8:19 PM

## 2020-04-08 ENCOUNTER — Ambulatory Visit: Payer: Medicaid Other

## 2020-04-08 NOTE — Progress Notes (Deleted)
    SUBJECTIVE:   CHIEF COMPLAINT / HPI:   Vaginal bump She was seen on 9/13 at MAU for the same She was advised to soak with Epson salt as likely cause irritation of pubic hair ***  PERTINENT  PMH / PSH: Gestational diabetes, schizophrenia, bipolar disorder  OBJECTIVE:   LMP 04/05/2020    Physical Exam: *** General: 29 y.o. female in NAD Cardio: RRR no m/r/g Lungs: CTAB, no wheezing, no rhonchi, no crackles, no IWOB on *** Abdomen: Soft, non-tender to palpation, non-distended, positive bowel sounds Skin: warm and dry Extremities: No edema GU: Pelvic exam performed with patient supine.  Chaperone in room.  Bilateral labia without abnormalities, no inguinal LAD palpated.  Cervix exhibits *** discharge, no cervix abnormalities.  No vaginal lesions.  Vaginal discharge ***.   ASSESSMENT/PLAN:   No problem-specific Assessment & Plan notes found for this encounter.     Unknown Jim, DO [facility]

## 2020-04-14 ENCOUNTER — Ambulatory Visit (INDEPENDENT_AMBULATORY_CARE_PROVIDER_SITE_OTHER): Payer: Medicaid Other | Admitting: Student in an Organized Health Care Education/Training Program

## 2020-04-14 ENCOUNTER — Telehealth: Payer: Self-pay

## 2020-04-14 ENCOUNTER — Other Ambulatory Visit: Payer: Self-pay

## 2020-04-14 VITALS — BP 128/60 | HR 50 | Ht 66.0 in | Wt 190.8 lb

## 2020-04-14 DIAGNOSIS — N760 Acute vaginitis: Secondary | ICD-10-CM | POA: Diagnosis not present

## 2020-04-14 DIAGNOSIS — B9689 Other specified bacterial agents as the cause of diseases classified elsewhere: Secondary | ICD-10-CM | POA: Diagnosis not present

## 2020-04-14 DIAGNOSIS — N898 Other specified noninflammatory disorders of vagina: Secondary | ICD-10-CM | POA: Diagnosis present

## 2020-04-14 DIAGNOSIS — A630 Anogenital (venereal) warts: Secondary | ICD-10-CM | POA: Diagnosis not present

## 2020-04-14 LAB — POCT WET PREP (WET MOUNT)
Clue Cells Wet Prep Whiff POC: POSITIVE
Trichomonas Wet Prep HPF POC: ABSENT

## 2020-04-14 MED ORDER — METRONIDAZOLE 500 MG PO TABS: 500.0000 mg | ORAL_TABLET | Freq: Two times a day (BID) | ORAL | 0 refills | Status: DC

## 2020-04-14 MED ORDER — IMIQUIMOD 2.5 % EX CREA: TOPICAL_CREAM | CUTANEOUS | 0 refills | Status: DC

## 2020-04-14 NOTE — Progress Notes (Signed)
    SUBJECTIVE:   CHIEF COMPLAINT / HPI: Vaginal lesion  Patient has vaginal lesion on her labia that she first noticed September 13 at the time of onset of her menstrual cycle.  She was seen at the MAU and has been following their directions to soak with warm compresses and Epsom salt baths.  The lesion has decreased in size since then. She has had lesions like this in the past. Has engaged in unprotected sex with men and women. There is no pain or drainage or bleeding. Denies fevers or rashes. Had a normal period which has resolved at time of appointment.  OBJECTIVE:   BP 128/60   Pulse (!) 50   Ht 5\' 6"  (1.676 m)   Wt 190 lb 12.8 oz (86.5 kg)   LMP 04/05/2020   SpO2 99%   BMI 30.80 kg/m   General: NAD, pleasant, able to participate in exam Pelvic: Condylomata acuminata present.  Lesion on left superior labia and near right inguinal fold.  Lesions are pedunculated, flesh-colored, not erythematous or painful.  Surface area is warty.  There is no surrounding induration and no drainage. Vaginal canal positive for mucousy white discharge Extremities: no edema or cyanosis. WWP. 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 Patient has history of STI and recent pap with ASCUS neg for high-risk HPV Prescribed imiquimod topical 3x/week for up to 8 weeks or until resolved. Return if not resolved in 8 weeks for repeat exam and consider physical removal Provided patient with educational handout  BV (bacterial vaginosis) Wet prep positive for clue cells. Exam positive for white discharge.  Prescribed metro 500mg  BID x7 days Patient informed via mychart     06/05/2020, DO [facility]

## 2020-04-14 NOTE — Telephone Encounter (Signed)
Patient calls nurse line upset with her "HPV" diagnosis. Patient reports she has been with the same person for 7 years and was told she had "spots" on her vagina that came from HPV. Patient reports she did have a pap, however HPV was not tested. Patient is requesting another pap smear with HPV, "just to be sure." Patient is also interested in the HPV series, however unsure due to her age. Please advise.

## 2020-04-14 NOTE — Assessment & Plan Note (Signed)
Wet prep positive for clue cells. Exam positive for white discharge.  Prescribed metro 500mg  BID x7 days Patient informed via 

## 2020-04-14 NOTE — Assessment & Plan Note (Addendum)
Likely diagnosis based on physical exam. Patient has history of STI and recent pap with ASCUS neg for high-risk HPV Prescribed imiquimod topical 3x/week for up to 8 weeks or until resolved. Return if not resolved in 8 weeks for repeat exam and consider physical removal Provided patient with educational handout Patient requested information about HPV vaccine. I will look into evidence for utility.

## 2020-04-14 NOTE — Patient Instructions (Signed)
It was a pleasure to see you today!  To summarize our discussion for this visit:  I am giving you a prescription that can help with your immune system to clear the virus causing your bump. If it does not get better by 8 weeks, come back and we can discuss removing it.   Some additional health maintenance measures we should update are: Health Maintenance Due  Topic Date Due   Hepatitis C Screening  Never done   TETANUS/TDAP  Never done   INFLUENZA VACCINE  Never done   COVID-19 Vaccine (2 - Pfizer 2-dose series) 03/16/2020     Call the clinic at [phone] if your symptoms worsen or you have any concerns.   Thank you for allowing me to take part in your care,  Dr. Jamelle Rushing   Genital Warts Genital warts are small growths in the area around the genitals or the anus. They are caused by a type of germ (HPV virus). This germ is spread from person to person during sex. It can be spread through vaginal, anal, and oral sex. Genital warts can lead to other problems if they are not treated. A person is more likely to have this condition if he or she:  Has sex without using a condom.  Has sex with many people.  Has sex before the age of 67.  Has a weak body defense (immune) system. This condition can be treated with medicines. Your doctor may also burn or freeze the warts. In some cases, surgery may be done to remove the warts. Follow these instructions at home: Medicines   Apply over-the-counter and prescription medicines only as told by your doctor.  Do not use medicines that are meant for treating hand warts.  Talk with your doctor about using creams to treat itching. Instructions for women  Plan to have regular tests to check for cervical cancer. Your risk for this cancer increases when you have genital warts.  If you become pregnant, tell your doctor that you have had genital warts. The germ can be passed to the baby. General instructions  Do not touch or  scratch the warts.  Do not have sex until your treatment is done.  Tell your current and past sexual partners about your condition. They may need treatment.  After treatment, use condoms during sex.  Keep all follow-up visits as told by your doctor. This is important. How is this prevented? Talk with your doctor about getting the HPV shot. The HPV shot:  Can help stop some HPV infections and cancers.  Is given to males and females who are 67-51 years old.  Will not work if you already have HPV.  Is not recommended for pregnant women. Contact a doctor if:  You have redness, swelling, or pain in the area of the treated skin.  You have a fever.  You feel sick.  You feel lumps in the area around your genitals or anus.  You have bleeding in the area around your genitals or anus.  You have pain during sex. Summary  Genital warts are small growths in the areas around the genitals or the anus. They are caused by a type of germ (HPV virus).  The germ is spread by having vaginal, anal, or oral sex without using a condom.  This condition is treated using medicines. In some cases, freezing, burning, or surgery may be done to get rid of the warts.  This condition may be prevented by getting a HPV shot. This information  is not intended to replace advice given to you by your health care provider. Make sure you discuss any questions you have with your health care provider. Document Revised: 08/15/2017 Document Reviewed: 08/15/2017 Elsevier Patient Education  2020 [employer].

## 2020-04-16 ENCOUNTER — Telehealth: Payer: Self-pay

## 2020-04-16 NOTE — Telephone Encounter (Signed)
Patient returns call to nurse line regarding message from below. Advised patient of positive BV diagnosis and that medication has been sent to pharmacy. Patient states that she is unable to access myChart. Patient is still very concerned about HPV diagnosis. Rescheduled patient appointment tomorrow morning to discuss and follow up with provider regarding concerns.   Veronda Prude, RN

## 2020-04-16 NOTE — Telephone Encounter (Signed)
Insurance will not cover Zyclara 2.5% Cream. Please see below for alternatives.  Fluorouracil Imiquimod Imiquimod Pump

## 2020-04-17 ENCOUNTER — Ambulatory Visit: Payer: Medicaid Other | Admitting: Student in an Organized Health Care Education/Training Program

## 2020-05-29 DIAGNOSIS — Z114 Encounter for screening for human immunodeficiency virus [HIV]: Secondary | ICD-10-CM | POA: Diagnosis not present

## 2020-05-29 DIAGNOSIS — Z113 Encounter for screening for infections with a predominantly sexual mode of transmission: Secondary | ICD-10-CM | POA: Diagnosis not present

## 2020-05-29 DIAGNOSIS — Z1388 Encounter for screening for disorder due to exposure to contaminants: Secondary | ICD-10-CM | POA: Diagnosis not present

## 2020-05-29 DIAGNOSIS — Z0389 Encounter for observation for other suspected diseases and conditions ruled out: Secondary | ICD-10-CM | POA: Diagnosis not present

## 2020-05-29 DIAGNOSIS — N760 Acute vaginitis: Secondary | ICD-10-CM | POA: Diagnosis not present

## 2020-05-29 DIAGNOSIS — Z3009 Encounter for other general counseling and advice on contraception: Secondary | ICD-10-CM | POA: Diagnosis not present

## 2020-05-29 DIAGNOSIS — B081 Molluscum contagiosum: Secondary | ICD-10-CM | POA: Diagnosis not present

## 2020-07-01 ENCOUNTER — Encounter (HOSPITAL_COMMUNITY): Payer: Self-pay

## 2020-07-01 ENCOUNTER — Ambulatory Visit (HOSPITAL_COMMUNITY)
Admission: EM | Admit: 2020-07-01 | Discharge: 2020-07-01 | Disposition: A | Discharge status: Home / Self Care | Payer: Medicaid Other

## 2020-07-01 ENCOUNTER — Other Ambulatory Visit: Payer: Self-pay

## 2020-07-01 DIAGNOSIS — R21 Rash and other nonspecific skin eruption: Secondary | ICD-10-CM

## 2020-07-01 HISTORY — DX: Bipolar disorder, unspecified: F31.9

## 2020-07-01 MED ORDER — TRIAMCINOLONE ACETONIDE 0.1 % EX CREA: 1.0000 "application " | TOPICAL_CREAM | Freq: Two times a day (BID) | CUTANEOUS | 0 refills | Status: DC

## 2020-07-01 NOTE — ED Provider Notes (Signed)
[facility]    CSN: [id number] Arrival date & time: 07/01/20  7425      History   Chief Complaint Chief Complaint  Patient presents with  . Rash    HPI Sandra Anderson is a 29 y.o. female.   Here today with intermittent 3 month history of a circular raised rash on forehead with a smaller similar plaque above right eyebrow. Some itching/irritation but overall no drainage, flaking skin, color changes, fever, chills, body aches. No new exposures, family members with similar rashes, history of the same. Has tried several weeks on antifungals without benefit and also neosporin without change.      Past Medical History:  Diagnosis Date  . Bipolar 1 disorder (HCC)   . Chlamydia contact, treated   . Depression    pp depression after first and second children no counseling or meds    Patient Active Problem List   Diagnosis Date Noted  . Condyloma acuminata 04/14/2020  . BV (bacterial vaginosis) 04/14/2020  . Vaginal discharge 12/04/2019  . Gestational diabetes mellitus (GDM) affecting pregnancy 10/01/2017  . Schizophrenia (HCC) 06/21/2017  . Bipolar affective (HCC) 06/21/2017  . ASCUS of cervix with negative high risk HPV 06/21/2017  . History of ectopic pregnancy 03/03/2017    Past Surgical History:  Procedure Laterality Date  . MOUTH SURGERY      OB History    Gravida  5   Para  4   Term  4   Preterm  0   AB  1   Living  4     SAB  0   TAB  0   Ectopic  1   Multiple  0   Live Births  4            Home Medications    Prior to Admission medications   Medication Sig Start Date End Date Taking? Authorizing Provider  ARIPiprazole (ABILIFY) 5 MG tablet Take 5 mg by mouth daily.   Yes [provider]  clotrimazole (LOTRIMIN) 1 % cream Apply to affected area 2 times daily 08/01/19   Dahlia Byes A, NP  Imiquimod 2.5 % CREA Apply to effected area 3 times per week up to 8 weeks or until resolved. 04/14/20   Anderson, Chelsey L, DO   medroxyPROGESTERone (DEPO-PROVERA) 150 MG/ML injection Inject 150 mg into the muscle every 3 (three) months.    [provider]  metroNIDAZOLE (FLAGYL) 500 MG tablet Take 1 tablet (500 mg total) by mouth 2 (two) times daily. 04/14/20   Leeroy Bock, DO  nystatin-triamcinolone (MYCOLOG II) cream Apply to affected area daily 08/30/19   Dahlia Byes A, NP  Prenatal Vit-Fe Fumarate-FA (PRENATAL VITAMIN) 27-0.8 MG TABS Take 1 tablet by mouth daily. 11/29/19   Anderson, Chelsey L, DO  triamcinolone (KENALOG) 0.1 % Apply 1 application topically 2 (two) times daily. 07/01/20   Particia Nearing, PA-C    Family History Family History  Problem Relation Age of Onset  . Hypertension Mother   . Cirrhosis Mother   . Alcohol abuse Mother     Social History Social History   Tobacco Use  . Smoking status: Former Smoker    Packs/day: 0.25    Years: 2.00    Pack years: 0.50    Types: Cigarettes    Quit date: 08/03/2017    Years since quitting: 2.9  . Smokeless tobacco: Former Clinical biochemist  . Vaping Use: Never used  Substance Use Topics  . Alcohol  use: No  . Drug use: No     Allergies   Patient has no known allergies.   Review of Systems Review of Systems PER HPI    Physical Exam Triage Vital Signs ED Triage Vitals  Enc Vitals Group     BP 07/01/20 0847 (!) 143/84     Pulse Rate 07/01/20 0847 69     Resp 07/01/20 0847 17     Temp 07/01/20 0847 98.4 F (36.9 C)     Temp Source 07/01/20 0847 Oral     SpO2 07/01/20 0847 98 %     Weight --      Height --      Head Circumference --      Peak Flow --      Pain Score 07/01/20 0843 0     Pain Loc --      Pain Edu? --      Excl. in GC? --    No data found.  Updated Vital Signs BP (!) 143/84 (BP Location: Right Arm)   Pulse 69   Temp 98.4 F (36.9 C) (Oral)   Resp 17   LMP 06/10/2020 (Approximate)   SpO2 98%   Visual Acuity Right Eye Distance:   Left Eye Distance:   Bilateral Distance:    Right  Eye Near:   Left Eye Near:    Bilateral Near:     Physical Exam Vitals and nursing note reviewed.  Constitutional:      Appearance: Normal appearance. She is not ill-appearing.  HENT:     Head: Atraumatic.     Nose: Nose normal.     Mouth/Throat:     Mouth: Mucous membranes are moist.     Pharynx: Oropharynx is clear.  Eyes:     Extraocular Movements: Extraocular movements intact.     Conjunctiva/sclera: Conjunctivae normal.  Cardiovascular:     Rate and Rhythm: Normal rate and regular rhythm.     Heart sounds: Normal heart sounds.  Pulmonary:     Effort: Pulmonary effort is normal.     Breath sounds: Normal breath sounds.  Abdominal:     General: Bowel sounds are normal. There is no distension.     Palpations: Abdomen is soft.     Tenderness: There is no abdominal tenderness. There is no guarding.  Musculoskeletal:        General: Normal range of motion.     Cervical back: Normal range of motion and neck supple.  Skin:    General: Skin is warm and dry.     Findings: Rash (raised border circular mildly erythematous rash of central forehead, about 1.5 cm diameter with similar patch above right eyebrow. ) present.  Neurological:     Mental Status: She is alert and oriented to person, place, and time.     Motor: No weakness.     Gait: Gait normal.  Psychiatric:        Mood and Affect: Mood normal.        Thought Content: Thought content normal.        Judgment: Judgment normal.      UC Treatments / Results  Labs (all labs ordered are listed, but only abnormal results are displayed) Labs Reviewed - No data to display  EKG   Radiology No results found.  Procedures Procedures (including critical care time)  Medications Ordered in UC Medications - No data to display  Initial Impression / Assessment and Plan / UC Course  I have reviewed the  triage vital signs and the nursing notes.  Pertinent labs & imaging results that were available during my care of the  patient were reviewed by me and considered in my medical decision making (see chart for details).     No response to antifungal medications, does not appear infectious. Possible granuloma annulare or other inflammatory process. WIll trial triamcinolone, unscented moisturizers, and close Dermatology f/u if not resolving.   Final Clinical Impressions(s) / UC Diagnoses   Final diagnoses:  Rash   Discharge Instructions   None    ED Prescriptions    Medication Sig Dispense Auth. Provider   triamcinolone (KENALOG) 0.1 % Apply 1 application topically 2 (two) times daily. 30 g Particia Nearing, [place]     PDMP not reviewed this encounter.   Roosvelt Maser [place], [place] 07/01/20 [phone]

## 2020-07-01 NOTE — ED Triage Notes (Addendum)
Pt c/o itchy rash to center of forehead and right eyebrow area intermittently for several months.  Raised circular rash approx 3 cm in diameter noted. No drainage from area noted.  Has been applying topical ABX ointment w/o improvement. Pt takes 3 medications for bipolar, but uncertain of names of medications.

## 2020-07-02 DIAGNOSIS — R21 Rash and other nonspecific skin eruption: Secondary | ICD-10-CM | POA: Diagnosis not present

## 2020-07-02 DIAGNOSIS — L918 Other hypertrophic disorders of the skin: Secondary | ICD-10-CM | POA: Diagnosis not present

## 2020-07-02 DIAGNOSIS — D485 Neoplasm of uncertain behavior of skin: Secondary | ICD-10-CM | POA: Diagnosis not present

## 2020-08-18 DIAGNOSIS — L218 Other seborrheic dermatitis: Secondary | ICD-10-CM | POA: Diagnosis not present

## 2020-08-18 DIAGNOSIS — L810 Postinflammatory hyperpigmentation: Secondary | ICD-10-CM | POA: Diagnosis not present

## 2020-08-27 NOTE — Progress Notes (Signed)
    SUBJECTIVE:   CHIEF COMPLAINT / HPI:   Vaginal Irritation Started 1 week ago Hasn't noticed changes indischarge She denies vaginal odors She endorses vaginal pruritis, denies abnormal vaginal bleeding, dysuria, hematuria, pelvic pain, nausea, vomiting, fevers She did change body soaps in the last few weeks  No history of STIs She is sexually active and does not use condoms.   Contraception: none LMP mid-january She does not douche. Last Pap: 03/10/2020, normal Pap Desires HIV/RPR: no Prior Hep C Ab: No, declines Throat or anal swab for G/C: no   PERTINENT  PMH / PSH: History of gestational diabetes, bipolar affective, schizophrenia  OBJECTIVE:   BP 102/72   Pulse (!) 59   Wt 192 lb 6.4 oz (87.3 kg)   SpO2 100%   BMI 31.05 kg/m    Physical Exam: General: In NAD Respiratory: Breathing comfortably on room air GU: Pelvic exam performed with patient supine.  Chaperone in room.  Bilateral labia without abnormalities, small area of excoriations on right inferior labia majora without redness or lesions.  Cervix exhibits thick, white discharge, no cervix abnormalities.  No vaginal lesions.  Vaginal discharge thick, white.  Results for orders placed or performed in visit on 08/28/20 (from the past 24 hour(s))  POCT Wet Prep Mellody Drown [place])     Status: Abnormal   Collection Time: 08/28/20  9:45 AM  Result Value Ref Range   Source Wet Prep POC VAG    WBC, Wet Prep HPF POC 5-10    Bacteria Wet Prep HPF POC Many (A) Few   Clue Cells Wet Prep HPF POC None None   Clue Cells Wet Prep Whiff POC Negative Whiff    Yeast Wet Prep HPF POC Moderate (A) None   KOH Wet Prep POC Moderate (A) None   Trichomonas Wet Prep HPF POC Absent Absent     ASSESSMENT/PLAN:   Yeast vaginitis Wet prep and exam consistent with a yeast infection.  Will treat with Diflucan 150 mg every 3 days for 2 doses.  Return to care if no improvement.  This could likely be a source of her vaginal irritation.  See  below.  Vaginal irritation Could be related to change in soap, see an area of excoriation where she is likely been itching.  Discussed with patient using mild soaps without scents, only a small amount of soap and a large amount of water to cleanse the area.  She does not appear to need a topical steroid at this time.  Also could be having some irritation from candidal vaginitis, see above.  We will treat for this and have her cleansing regimen.  Return to care if no improvement.  Emergency contraceptive counseling Discussed with patient that she is currently having unprotected sex and could get pregnant.  She declines that she is pregnant at this time.  LMP middle of last month.  Recommended birth control.  The risks and benefits were dicussed with patient regarding the following forms of birth control: oral contraceptive pills, Depo-Provera shot, Nuva-Ring, hormonal IUD, copper IUD, Nexplanon, and condoms.  The patient decided she would like to continue to think about this.  Encouraged her to use condoms in the meantime to avoid pregnancy.  She was counseled on possible side effects of this method.  She denies a history of migraines with aura, DVT, PE, and family history of DVT or PE.      Unknown Jim, DO [facility]

## 2020-08-28 ENCOUNTER — Ambulatory Visit (INDEPENDENT_AMBULATORY_CARE_PROVIDER_SITE_OTHER): Payer: Medicaid Other | Admitting: Family Medicine

## 2020-08-28 ENCOUNTER — Other Ambulatory Visit: Payer: Self-pay

## 2020-08-28 VITALS — BP 102/72 | HR 59 | Wt 192.4 lb

## 2020-08-28 DIAGNOSIS — B373 Candidiasis of vulva and vagina: Secondary | ICD-10-CM

## 2020-08-28 DIAGNOSIS — N898 Other specified noninflammatory disorders of vagina: Secondary | ICD-10-CM

## 2020-08-28 DIAGNOSIS — B3731 Acute candidiasis of vulva and vagina: Secondary | ICD-10-CM | POA: Insufficient documentation

## 2020-08-28 DIAGNOSIS — Z3009 Encounter for other general counseling and advice on contraception: Secondary | ICD-10-CM | POA: Diagnosis not present

## 2020-08-28 LAB — POCT WET PREP (WET MOUNT)
Clue Cells Wet Prep Whiff POC: NEGATIVE
Trichomonas Wet Prep HPF POC: ABSENT

## 2020-08-28 MED ORDER — FLUCONAZOLE 150 MG PO TABS: 150.0000 mg | ORAL_TABLET | ORAL | 0 refills | Status: DC

## 2020-08-28 NOTE — Patient Instructions (Signed)
Thank you for coming to see me today. It was a pleasure. Today we talked about:   You had a yeast infection on your test.  We will treat this with Diflucan, which she should take once and then repeat in 3 days if still no improvement.  I do not have improvement in your symptoms within 1 week, please come back.  I would also recommend discontinuing use of scented soaps in your vaginal area.  You should use only a small amount of mild unscented soap and lots of water to clean the area.  If you continue to have irritation, please come back.  If you have any questions or concerns, please do not hesitate to call the office at [phone].  Best,   Luis Abed, DO

## 2020-08-28 NOTE — Assessment & Plan Note (Signed)
Could be related to change in soap, see an area of excoriation where she is likely been itching.  Discussed with patient using mild soaps without scents, only a small amount of soap and a large amount of water to cleanse the area.  She does not appear to need a topical steroid at this time.  Also could be having some irritation from candidal vaginitis, see above.  We will treat for this and have her cleansing regimen.  Return to care if no improvement.

## 2020-08-28 NOTE — Assessment & Plan Note (Signed)
Discussed with patient that she is currently having unprotected sex and could get pregnant.  She declines that she is pregnant at this time.  LMP middle of last month.  Recommended birth control.  The risks and benefits were dicussed with patient regarding the following forms of birth control: oral contraceptive pills, Depo-Provera shot, Nuva-Ring, hormonal IUD, copper IUD, Nexplanon, and condoms.  The patient decided she would like to continue to think about this.  Encouraged her to use condoms in the meantime to avoid pregnancy.  She was counseled on possible side effects of this method.  She denies a history of migraines with aura, DVT, PE, and family history of DVT or PE.

## 2020-08-28 NOTE — Assessment & Plan Note (Signed)
Wet prep and exam consistent with a yeast infection.  Will treat with Diflucan 150 mg every 3 days for 2 doses.  Return to care if no improvement.  This could likely be a source of her vaginal irritation.  See below.

## 2020-11-05 ENCOUNTER — Other Ambulatory Visit: Payer: Self-pay

## 2020-11-05 ENCOUNTER — Encounter: Payer: Self-pay | Admitting: Obstetrics

## 2020-11-05 ENCOUNTER — Ambulatory Visit (INDEPENDENT_AMBULATORY_CARE_PROVIDER_SITE_OTHER): Payer: Medicaid Other | Admitting: Obstetrics

## 2020-11-05 VITALS — BP 129/91 | HR 84 | Ht 67.0 in | Wt 187.0 lb

## 2020-11-05 DIAGNOSIS — Z3009 Encounter for other general counseling and advice on contraception: Secondary | ICD-10-CM | POA: Diagnosis not present

## 2020-11-05 DIAGNOSIS — Z30013 Encounter for initial prescription of injectable contraceptive: Secondary | ICD-10-CM

## 2020-11-05 LAB — POCT URINE PREGNANCY: Preg Test, Ur: NEGATIVE

## 2020-11-05 MED ORDER — MEDROXYPROGESTERONE ACETATE 150 MG/ML IM SUSP: 150.0000 mg | INTRAMUSCULAR | 4 refills | Status: DC

## 2020-11-05 NOTE — Progress Notes (Signed)
GYN presents Birthcontrol consult, she wants DEPO.  She had unprotected sex 2 nights ago.  UPT today is NEGATIVE  Last PAP 03/10/2020

## 2020-11-05 NOTE — Progress Notes (Signed)
Subjective:    Sandra Anderson is a 30 y.o. female who presents for contraception counseling. The patient has no complaints today. The patient is sexually active. Pertinent past medical history: sexually transmitted diseases.  She wants to start Depo injections.  The information documented in the HPI was reviewed and verified.  Menstrual History: OB History    Gravida  5   Para  4   Term  4   Preterm  0   AB  1   Living  4     SAB  0   IAB  0   Ectopic  1   Multiple  0   Live Births  4            Patient's last menstrual period was 10/13/2019 (approximate).   Patient Active Problem List   Diagnosis Date Noted  . Yeast vaginitis 08/28/2020  . Condyloma acuminata 04/14/2020  . BV (bacterial vaginosis) 04/14/2020  . Emergency contraceptive counseling 12/04/2019  . Vaginal irritation 12/04/2019  . Gestational diabetes mellitus (GDM) affecting pregnancy 10/01/2017  . Schizophrenia (HCC) 06/21/2017  . Bipolar affective (HCC) 06/21/2017  . ASCUS of cervix with negative high risk HPV 06/21/2017  . History of ectopic pregnancy 03/03/2017   Past Medical History:  Diagnosis Date  . Bipolar 1 disorder (HCC)   . Chlamydia contact, treated   . Depression    pp depression after first and second children no counseling or meds    Past Surgical History:  Procedure Laterality Date  . MOUTH SURGERY       Current Outpatient Medications:  .  medroxyPROGESTERone (DEPO-PROVERA) 150 MG/ML injection, Inject 1 mL (150 mg total) into the muscle every 3 (three) months., Disp: 1 mL, Rfl: 4 .  ARIPiprazole (ABILIFY) 5 MG tablet, Take 5 mg by mouth daily. (Patient not taking: Reported on 11/05/2020), Disp: , Rfl:  .  clotrimazole (LOTRIMIN) 1 % cream, Apply to affected area 2 times daily (Patient not taking: Reported on 11/05/2020), Disp: 15 g, Rfl: 0 .  fluconazole (DIFLUCAN) 150 MG tablet, Take 1 tablet (150 mg total) by mouth every 3 (three) days. (Patient not taking: Reported on  11/05/2020), Disp: 2 tablet, Rfl: 0 .  Imiquimod 2.5 % CREA, Apply to effected area 3 times per week up to 8 weeks or until resolved. (Patient not taking: Reported on 11/05/2020), Disp: 7.5 g, Rfl: 0 .  metroNIDAZOLE (FLAGYL) 500 MG tablet, Take 1 tablet (500 mg total) by mouth 2 (two) times daily. (Patient not taking: Reported on 11/05/2020), Disp: 21 tablet, Rfl: 0 .  nystatin-triamcinolone (MYCOLOG II) cream, Apply to affected area daily (Patient not taking: Reported on 11/05/2020), Disp: 15 g, Rfl: 0 .  Prenatal Vit-Fe Fumarate-FA (PRENATAL VITAMIN) 27-0.8 MG TABS, Take 1 tablet by mouth daily. (Patient not taking: Reported on 11/05/2020), Disp: 90 tablet, Rfl: 2 .  triamcinolone (KENALOG) 0.1 %, Apply 1 application topically 2 (two) times daily. (Patient not taking: Reported on 11/05/2020), Disp: 30 g, Rfl: 0 No Known Allergies  Social History   Tobacco Use  . Smoking status: Former Smoker    Packs/day: 0.25    Years: 2.00    Pack years: 0.50    Types: Cigarettes    Quit date: 08/03/2017    Years since quitting: 3.2  . Smokeless tobacco: Former Engineer, water Use Topics  . Alcohol use: No    Family History  Problem Relation Age of Onset  . Hypertension Mother   . Cirrhosis Mother   .  Alcohol abuse Mother        Review of Systems Constitutional: negative for weight loss Genitourinary:negative for abnormal menstrual periods and vaginal discharge   Objective:   BP (!) 129/91   Pulse 84   Ht 5\' 7"  (1.702 m)   Wt 187 lb (84.8 kg)   LMP 10/13/2019 (Approximate)   BMI 29.29 kg/m    General:   alert and no distress  Skin:   no rash or abnormalities  Lungs:   clear to auscultation bilaterally  Heart:   regular rate and rhythm, S1, S2 normal, no murmur, click, rub or gallop  Lab Review Urine pregnancy test:  Negative Labs reviewed yes Radiologic studies reviewed no  I have spent a total of 15 minutes of face-to-face time, excluding clinical staff time, reviewing notes and  preparing to see patient, ordering tests and/or medications, and counseling the patient.   Assessment:    30 y.o., starting Depo-Provera injections, no contraindications.   Plan:    All questions answered. Contraception: Depo-Provera injections. Discussed healthy lifestyle modifications. Follow up in 2 weeks. Pregnancy test, result: negative.   Meds ordered this encounter  Medications  . medroxyPROGESTERone (DEPO-PROVERA) 150 MG/ML injection    Sig: Inject 1 mL (150 mg total) into the muscle every 3 (three) months.    Dispense:  1 mL    Refill:  4   Orders Placed This Encounter  Procedures  . POCT urine pregnancy      26, MD 11/05/2020 11:26 AM

## 2020-11-10 ENCOUNTER — Ambulatory Visit (INDEPENDENT_AMBULATORY_CARE_PROVIDER_SITE_OTHER): Payer: Medicaid Other | Admitting: *Deleted

## 2020-11-10 ENCOUNTER — Other Ambulatory Visit: Payer: Self-pay

## 2020-11-10 DIAGNOSIS — Z30013 Encounter for initial prescription of injectable contraceptive: Secondary | ICD-10-CM | POA: Diagnosis not present

## 2020-11-10 LAB — POCT URINE PREGNANCY: Preg Test, Ur: NEGATIVE

## 2020-11-10 MED ORDER — MEDROXYPROGESTERONE ACETATE 150 MG/ML IM SUSP
150.0000 mg | Freq: Once | INTRAMUSCULAR | Status: AC
  Administered 2020-11-10: 150 mg via INTRAMUSCULAR

## 2020-11-10 NOTE — Progress Notes (Signed)
Pt is in office today for Depo injection.  Pt was seen in office last week and advised she may return for Depo once she started cycle. Pt states started cycle today.  UPT in office today is negative. Per review with provider, may give depo today.  Depo to be given today.  Pt tolerated injection well.  Pt advised to RTO July 5-19 for next injection.   Administrations This Visit    medroxyPROGESTERone (DEPO-PROVERA) injection 150 mg    Admin Date 11/10/2020 Action Given Dose 150 mg Route Intramuscular Administered By Lanney Gins, CMA

## 2020-11-10 NOTE — Progress Notes (Signed)
Patient was assessed and managed by nursing staff during this encounter. I have reviewed the chart and agree with the documentation and plan. I have also made any necessary editorial changes.  Cherre Robins, CNM 11/10/2020 1:46 PM

## 2020-11-12 ENCOUNTER — Ambulatory Visit: Payer: Medicaid Other | Admitting: Student in an Organized Health Care Education/Training Program

## 2021-01-28 ENCOUNTER — Ambulatory Visit (INDEPENDENT_AMBULATORY_CARE_PROVIDER_SITE_OTHER): Payer: Medicaid Other

## 2021-01-28 ENCOUNTER — Other Ambulatory Visit: Payer: Self-pay

## 2021-01-28 VITALS — BP 136/93 | HR 101 | Ht 68.0 in | Wt 182.0 lb

## 2021-01-28 DIAGNOSIS — Z3042 Encounter for surveillance of injectable contraceptive: Secondary | ICD-10-CM | POA: Diagnosis not present

## 2021-01-28 MED ORDER — MEDROXYPROGESTERONE ACETATE 150 MG/ML IM SUSP
150.0000 mg | Freq: Once | INTRAMUSCULAR | Status: AC
  Administered 2021-01-28: 150 mg via INTRAMUSCULAR

## 2021-01-28 NOTE — Progress Notes (Addendum)
SUBJECTIVE: Sandra Anderson is a 30 y.o. female who presents for DEPO Injection.   OBJECTIVE: Appears well, in no apparent distress.  Vital signs are normal. BC management.   ASSESSMENT: On time for DEPO.  Needs Annual Exam.  PLAN:  DEPO given in RUOQ, tolerated well.  Next DEPO due Sept. 22 - Oct. 6, 2022  Make appt for Annual Exam.  Administrations This Visit     medroxyPROGESTERone (DEPO-PROVERA) injection 150 mg     Admin Date 01/28/2021 Action Given Dose 150 mg Route Intramuscular Administered By Maretta Bees, RMA            Patient was assessed and managed by nursing staff during this encounter. I have reviewed the chart and agree with the documentation and plan. I have also made any necessary editorial changes.  Coral Ceo, MD 01/28/2021 1:08 PM

## 2021-02-05 ENCOUNTER — Ambulatory Visit (INDEPENDENT_AMBULATORY_CARE_PROVIDER_SITE_OTHER): Payer: Medicaid Other | Admitting: Family Medicine

## 2021-02-05 ENCOUNTER — Other Ambulatory Visit: Payer: Self-pay

## 2021-02-05 ENCOUNTER — Encounter: Payer: Self-pay | Admitting: Family Medicine

## 2021-02-05 VITALS — BP 136/88 | HR 65 | Ht 68.0 in | Wt 180.4 lb

## 2021-02-05 DIAGNOSIS — B372 Candidiasis of skin and nail: Secondary | ICD-10-CM | POA: Diagnosis not present

## 2021-02-05 DIAGNOSIS — N898 Other specified noninflammatory disorders of vagina: Secondary | ICD-10-CM

## 2021-02-05 LAB — POCT WET PREP (WET MOUNT)
Clue Cells Wet Prep Whiff POC: NEGATIVE
Trichomonas Wet Prep HPF POC: ABSENT

## 2021-02-05 MED ORDER — FLUCONAZOLE 150 MG PO TABS: 150.0000 mg | ORAL_TABLET | ORAL | 0 refills | Status: DC

## 2021-02-05 NOTE — Progress Notes (Signed)
         SUBJECTIVE:   CHIEF COMPLAINT / HPI:   Chief Complaint  Patient presents with   Vaginal Itching    Sandra Anderson is a 30 y.o. female presents for   Vaginal Itching Having vaginal itching for the past month. No vaginal discharge.  Medications tried: None No recent antibiotic use  - Denies odor, burning, abdominal pain, dysuria or hematuria, nausea or vomiting, fevers or pelvic pain.  - Patient reports she had STIs in the past and was treated.  - Sexully active. No symptoms in female partner  - No LMP recorded. Patient has had an injection. - Patient reports hx  BV, yeast in the past.  - Patient denies douching.  Uses scented soap.  -Contraception: Depo provera.  Possible STD exposure: no. Declines STD testing.     PERTINENT  PMH / PSH: reviewed and updated as appropriate   OBJECTIVE:   BP 136/88   Pulse 65   Ht 5\' 8"  (1.727 m)   Wt 180 lb 6.4 oz (81.8 kg)   SpO2 98%   BMI 27.43 kg/m   GEN: well appearing female in no acute distress  CVS: well perfused  RESP: speaking in full sentences without pause  ABD: soft, non-tender, non-distended, no palpable masses  Pelvic exam: moist, hypopigmented area on the perineum with excoriations, no vesicles, normal external genitalia, vulva, VAGINA and CERVIX: normal appearing cervix without discharge or lesions, WET MOUNT done - results: negative for pathogens, normal epithelial cells, exam chaperoned by CMA Tashira.     ASSESSMENT/PLAN:   No problem-specific Assessment & Plan notes found for this encounter.    Suspected candidal skin infection  - Treatment: Fluconazole 150 mg x1 dose   - F/U if symptoms not improving or getting worse.  - Declined STD testing today. If not improving, would recommend STD testing including HSV - F/U with PCP as needed.  - Advised to use barrier protection             , DO [facility]

## 2021-02-16 ENCOUNTER — Ambulatory Visit (HOSPITAL_COMMUNITY)
Admission: EM | Admit: 2021-02-16 | Discharge: 2021-02-16 | Disposition: A | Discharge status: Home / Self Care | Payer: Medicaid Other | Attending: Family Medicine | Admitting: Family Medicine

## 2021-02-16 ENCOUNTER — Encounter (HOSPITAL_COMMUNITY): Payer: Self-pay | Admitting: *Deleted

## 2021-02-16 ENCOUNTER — Other Ambulatory Visit: Payer: Self-pay

## 2021-02-16 DIAGNOSIS — K047 Periapical abscess without sinus: Secondary | ICD-10-CM

## 2021-02-16 MED ORDER — AMOXICILLIN-POT CLAVULANATE 875-125 MG PO TABS: 1.0000 | ORAL_TABLET | Freq: Two times a day (BID) | ORAL | 0 refills | Status: AC

## 2021-02-16 MED ORDER — NAPROXEN 500 MG PO TABS: 500.0000 mg | ORAL_TABLET | Freq: Two times a day (BID) | ORAL | 0 refills | Status: DC

## 2021-02-16 NOTE — ED Provider Notes (Signed)
[facility]    CSN: [id number] Arrival date & time: 02/16/21  1744      History   Chief Complaint Chief Complaint  Patient presents with   Dental Pain    HPI Sandra Anderson is a 30 y.o. female.   Dental Pain Bottom left of her mouth in her back tooth Has been having worsening pain in the last two days Pain was very severe last night and couldn't sleep Has noticed swelling in her cheek and a swollen lymph node on the left side of her neck She denies fevers, shortness of breath, difficulty swallowing She has tried warm rinses for pain, but nothing else She has a dental appointment made for August 3rd She is otherwise feeling well   Past Medical History:  Diagnosis Date   Bipolar 1 disorder (HCC)    Chlamydia contact, treated    Depression    pp depression after first and second children no counseling or meds    Patient Active Problem List   Diagnosis Date Noted   Yeast vaginitis 08/28/2020   Condyloma acuminata 04/14/2020   BV (bacterial vaginosis) 04/14/2020   Emergency contraceptive counseling 12/04/2019   Vaginal irritation 12/04/2019   Gestational diabetes mellitus (GDM) affecting pregnancy 10/01/2017   Schizophrenia (HCC) 06/21/2017   Bipolar affective (HCC) 06/21/2017   ASCUS of cervix with negative high risk HPV 06/21/2017   History of ectopic pregnancy 03/03/2017    Past Surgical History:  Procedure Laterality Date   MOUTH SURGERY      OB History     Gravida  5   Para  4   Term  4   Preterm  0   AB  1   Living  4      SAB  0   IAB  0   Ectopic  1   Multiple  0   Live Births  4            Home Medications    Prior to Admission medications   Medication Sig Start Date End Date Taking? Authorizing Provider  amoxicillin-clavulanate (AUGMENTIN) 875-125 MG tablet Take 1 tablet by mouth every 12 (twelve) hours for 10 days. 02/16/21 02/26/21 Yes Leni Pankonin, Solmon Ice, DO  naproxen (NAPROSYN) 500 MG tablet Take 1 tablet  (500 mg total) by mouth 2 (two) times daily. 02/16/21  Yes Landi Biscardi, Solmon Ice, DO  ARIPiprazole (ABILIFY) 5 MG tablet Take 5 mg by mouth daily. Patient not taking: Reported on 11/05/2020    [provider]  fluconazole (DIFLUCAN) 150 MG tablet Take 1 tablet (150 mg total) by mouth every 3 (three) days. 02/05/21   Brimage, Seward Meth, DO  Imiquimod 2.5 % CREA Apply to effected area 3 times per week up to 8 weeks or until resolved. Patient not taking: Reported on 11/05/2020 04/14/20   Jamelle Rushing L, DO  medroxyPROGESTERone (DEPO-PROVERA) 150 MG/ML injection Inject 1 mL (150 mg total) into the muscle every 3 (three) months. 11/05/20   Brock Bad, MD  nystatin-triamcinolone (MYCOLOG II) cream Apply to affected area daily Patient not taking: Reported on 11/05/2020 08/30/19   Janace Aris, NP  Prenatal Vit-Fe Fumarate-FA (PRENATAL VITAMIN) 27-0.8 MG TABS Take 1 tablet by mouth daily. Patient not taking: Reported on 11/05/2020 11/29/19   Jamelle Rushing L, DO  triamcinolone (KENALOG) 0.1 % Apply 1 application topically 2 (two) times daily. Patient not taking: Reported on 11/05/2020 07/01/20   Particia Nearing, PA-C    Family History Family History  Problem  Relation Age of Onset   Hypertension Mother    Cirrhosis Mother    Alcohol abuse Mother     Social History Social History   Tobacco Use   Smoking status: Former    Packs/day: 0.25    Years: 2.00    Pack years: 0.50    Types: Cigarettes    Quit date: 08/03/2017    Years since quitting: 3.5   Smokeless tobacco: Former  Building services engineer Use: Never used  Substance Use Topics   Alcohol use: No   Drug use: No     Allergies   Patient has no known allergies.   Review of Systems Review of Systems  Constitutional:  Negative for activity change, appetite change and fever.  HENT:  Negative for congestion, drooling and sore throat.        Mouth pain in left lower jaw  Respiratory:  Negative for shortness of breath.    Cardiovascular:  Negative for chest pain.  Gastrointestinal:  Negative for vomiting.  Genitourinary:  Negative for difficulty urinating.  Musculoskeletal:  Negative for neck pain and neck stiffness.  Skin:        Swelling of left side of face    Physical Exam Triage Vital Signs ED Triage Vitals  Enc Vitals Group     BP 02/16/21 1845 (!) 135/98     Pulse Rate 02/16/21 1845 (!) 53     Resp 02/16/21 1845 20     Temp 02/16/21 1845 98.5 F (36.9 C)     Temp src --      SpO2 02/16/21 1845 100 %     Weight --      Height --      Head Circumference --      Peak Flow --      Pain Score 02/16/21 1844 10     Pain Loc --      Pain Edu? --      Excl. in GC? --    No data found.  Updated Vital Signs BP (!) 135/98   Pulse (!) 53   Temp 98.5 F (36.9 C)   Resp 20   LMP  (LMP Unknown)   SpO2 100%   Visual Acuity Right Eye Distance:   Left Eye Distance:   Bilateral Distance:    Right Eye Near:   Left Eye Near:    Bilateral Near:     Physical Exam Constitutional:      General: She is not in acute distress.    Appearance: Normal appearance. She is not ill-appearing or toxic-appearing.  HENT:     Head: Normocephalic and atraumatic.     Mouth/Throat:     Mouth: Mucous membranes are moist.     Pharynx: Oropharynx is clear. No pharyngeal swelling.      Comments: Airway is patent Pulmonary:     Effort: Pulmonary effort is normal.     Breath sounds: Normal breath sounds.  Lymphadenopathy:     Cervical: Cervical adenopathy (left anterior, tender) present.  Neurological:     General: No focal deficit present.     Mental Status: She is alert and oriented to person, place, and time.  Psychiatric:        Mood and Affect: Mood normal.        Behavior: Behavior normal.        Thought Content: Thought content normal.        Judgment: Judgment normal.     UC Treatments / Results  Labs (all labs ordered are listed, but only abnormal results are displayed) Labs Reviewed -  No data to display  EKG   Radiology No results found.  Procedures Procedures (including critical care time)  Medications Ordered in UC Medications - No data to display  Initial Impression / Assessment and Plan / UC Course  I have reviewed the triage vital signs and the nursing notes.  Pertinent labs & imaging results that were available during my care of the patient were reviewed by me and considered in my medical decision making (see chart for details).     Patient is a 30 year old female who has past medical history significant for schizophrenia and bipolar affective disorder who presents with 2 days of worsening dental pain in the left lower jaw.  She has no difficulty swallowing the pain airway.  She does have some left anterior cervical lymphadenopathy, but otherwise has no evidence of a deep infection.  No constitutional symptoms.  We will treat her with a 10-day course of Augmentin and give her naproxen to use twice daily for pain relief.  She was advised to call her dentist first in the morning to see if she can be seen sooner and advised that there is infection may improve with her treatment, but ultimately will not be treated until she is seen by dentist.  She voiced understanding.  She was advised to be seen in the ED if she has significant worsening of her symptoms, develops difficulty swallowing, difficulty breathing, fever, neck stiffness and pain.  She was discharged home in stable condition.   Final Clinical Impressions(s) / UC Diagnoses   Final diagnoses:  Dental abscess     Discharge Instructions      You likely have a dental abscess that is causing your pain.  We will send you an antibiotic to take to help with the infection and also send you an anti-inflammatory to help with the pain.  Ultimately however you need to be seen by dentist, to give them a call to schedule an appointment for as soon as possible to be evaluated.  If you have significant worsening in  your pain, difficulty swallowing, difficulty breathing, fevers, you should be seen in the emergency room right away.     ED Prescriptions     Medication Sig Dispense Auth. Provider   amoxicillin-clavulanate (AUGMENTIN) 875-125 MG tablet Take 1 tablet by mouth every 12 (twelve) hours for 10 days. 20 tablet Alan Drummer, Solmon Ice, DO   naproxen (NAPROSYN) 500 MG tablet Take 1 tablet (500 mg total) by mouth 2 (two) times daily. 30 tablet Anysha Frappier, Solmon Ice, DO      PDMP not reviewed this encounter.   Kasim Mccorkle, Solmon Ice, DO 02/16/21 1938

## 2021-02-16 NOTE — ED Triage Notes (Signed)
Pt reports dental pain on Lt lower side .

## 2021-02-16 NOTE — Discharge Instructions (Addendum)
You likely have a dental abscess that is causing your pain.  We will send you an antibiotic to take to help with the infection and also send you an anti-inflammatory to help with the pain.  Ultimately however you need to be seen by dentist, to give them a call to schedule an appointment for as soon as possible to be evaluated.  If you have significant worsening in your pain, difficulty swallowing, difficulty breathing, fevers, you should be seen in the emergency room right away.

## 2021-03-05 ENCOUNTER — Ambulatory Visit: Payer: Medicaid Other | Admitting: Obstetrics and Gynecology

## 2021-04-23 ENCOUNTER — Other Ambulatory Visit: Payer: Self-pay

## 2021-04-23 ENCOUNTER — Ambulatory Visit (INDEPENDENT_AMBULATORY_CARE_PROVIDER_SITE_OTHER): Payer: Medicaid Other

## 2021-04-23 VITALS — BP 126/86 | HR 57 | Ht 67.0 in | Wt 186.0 lb

## 2021-04-23 DIAGNOSIS — Z3042 Encounter for surveillance of injectable contraceptive: Secondary | ICD-10-CM

## 2021-04-23 MED ORDER — MEDROXYPROGESTERONE ACETATE 150 MG/ML IM SUSP
150.0000 mg | Freq: Once | INTRAMUSCULAR | Status: AC
  Administered 2021-04-23: 150 mg via INTRAMUSCULAR

## 2021-04-23 NOTE — Progress Notes (Signed)
Pt presents today for Depo Provera. Depo Provera 150mg  given IM LUOQ. Pt tolerated well with no adverse side effects noted. Next injection due 07/09/21-07/23/21. Pt to make appt upon check out. Pap up to date. Last pap 02/2020.

## 2021-05-11 NOTE — Progress Notes (Deleted)
    SUBJECTIVE:   CHIEF COMPLAINT / HPI:   ***  PERTINENT  PMH / PSH: ***  OBJECTIVE:   There were no vitals taken for this visit.  ***  ASSESSMENT/PLAN:   No problem-specific Assessment & Plan notes found for this encounter.     Bellina Tokarczyk, MD [facility]  

## 2021-05-12 ENCOUNTER — Other Ambulatory Visit: Payer: Self-pay

## 2021-05-12 ENCOUNTER — Ambulatory Visit (INDEPENDENT_AMBULATORY_CARE_PROVIDER_SITE_OTHER): Payer: Medicaid Other | Admitting: Student

## 2021-05-12 ENCOUNTER — Other Ambulatory Visit (HOSPITAL_COMMUNITY)
Admission: RE | Admit: 2021-05-12 | Discharge: 2021-05-12 | Disposition: A | Discharge status: Home / Self Care | Payer: Medicaid Other | Source: Ambulatory Visit | Attending: Family Medicine | Admitting: Family Medicine

## 2021-05-12 VITALS — BP 137/97 | HR 73 | Wt 188.6 lb

## 2021-05-12 DIAGNOSIS — N898 Other specified noninflammatory disorders of vagina: Secondary | ICD-10-CM

## 2021-05-12 DIAGNOSIS — R300 Dysuria: Secondary | ICD-10-CM | POA: Diagnosis not present

## 2021-05-12 LAB — POCT WET PREP (WET MOUNT)
Clue Cells Wet Prep Whiff POC: NEGATIVE
Trichomonas Wet Prep HPF POC: ABSENT
WBC, Wet Prep HPF POC: >20

## 2021-05-12 NOTE — Patient Instructions (Addendum)
It was great to see you! Thank you for allowing me to participate in your care!   I recommend that you always bring your medications to each appointment as this makes it easy to ensure we are on the correct medications and helps [place] not miss when refills are needed.  Our plans for today:  - Your wet prep showed moderate bacteria but no signs of yeast. -We will obtain a urine gonorrhea and chlamydia to rule out any STIs. Results take 2-3 days  Take care and seek immediate care sooner if you develop any concerns. Please remember to show up 15 minutes before your scheduled appointment time!  Levin Erp, MD [facility]

## 2021-05-12 NOTE — Progress Notes (Signed)
    SUBJECTIVE:   CHIEF COMPLAINT / HPI:   Vaginal Discharge: Patient is a 30 y.o. female presenting with vaginal discharge for a few months.  She states the discharge is of brown color and thin consistency.  She endorses no vaginal odor.  She is not interested in screening for sexually transmitted infections today. No pain or itching.   PERTINENT  PMH / PSH: None relevant  OBJECTIVE:   BP (!) 137/97   Pulse 73   Wt 188 lb 9.6 oz (85.5 kg)   SpO2 100%   BMI 29.54 kg/m    General: NAD, pleasant, able to participate in exam Respiratory: Normal effort, no obvious respiratory distress Pelvic: VULVA: normal appearing vulva with no masses, tenderness or lesions, VAGINA: Normal appearing vagina with normal color, no lesions, with copious white and thin discharge present, CERVIX: No lesions, white and thin discharge present. No cervical motion tenderness.  Chaperone CMA Deseree present for pelvic exam  ASSESSMENT/PLAN:   Vaginal irritation Wet prep showed moderate bacteria. Patient did not want G/C during pelvic exam. She was okay doing the urine g/c. -Follow up urine G/C   Assessment:  30 y.o. female with vaginal discharge for few months after starting Depo without other symptoms.  Physical exam significant for thin white discharge.  Wet prep performed today shows moderate bacteria without clue cells, yeast, or trichomonas. Patient is not interested in STI screening.   Plan: -Wet prep as above.   -Urine GC obtained, will follow-up -Follow-up as needed  Levin Erp, MD [facility]

## 2021-05-12 NOTE — Assessment & Plan Note (Signed)
Wet prep showed moderate bacteria. Patient did not want G/C during pelvic exam. She was okay doing the urine g/c. -Follow up urine G/C

## 2021-05-13 LAB — URINE CYTOLOGY ANCILLARY ONLY
Chlamydia: NEGATIVE
Comment: NEGATIVE
Comment: NEGATIVE
Comment: NORMAL
Neisseria Gonorrhea: NEGATIVE
Trichomonas: NEGATIVE

## 2021-05-14 ENCOUNTER — Telehealth: Payer: Self-pay | Admitting: Student

## 2021-05-14 NOTE — Telephone Encounter (Signed)
Confirmed DOB, patient name on phone.Told patient about negative gonorrhea, chlamydia and trichomonas results. Told her most likely to be physiologic discharge and likely normal. Advised if additional symptoms to be seen again in clinic.

## 2021-05-14 NOTE — Telephone Encounter (Signed)
Called patient, no voicemail set up. Will attempt again.

## 2021-07-14 ENCOUNTER — Ambulatory Visit: Payer: Medicaid Other

## 2021-07-21 ENCOUNTER — Ambulatory Visit (INDEPENDENT_AMBULATORY_CARE_PROVIDER_SITE_OTHER): Payer: Medicaid Other

## 2021-07-21 ENCOUNTER — Other Ambulatory Visit: Payer: Self-pay

## 2021-07-21 DIAGNOSIS — Z3042 Encounter for surveillance of injectable contraceptive: Secondary | ICD-10-CM | POA: Diagnosis not present

## 2021-07-21 MED ORDER — MEDROXYPROGESTERONE ACETATE 150 MG/ML IM SUSP
150.0000 mg | Freq: Once | INTRAMUSCULAR | Status: AC
  Administered 2021-07-21: 10:00:00 150 mg via INTRAMUSCULAR

## 2021-07-21 NOTE — Progress Notes (Signed)
ERROR

## 2021-07-21 NOTE — Progress Notes (Signed)
Subjective:  Pt in for Depo Provera injection.    Objective: Need for contraception. No unusual complaints.    Assessment: Depo given L upper outer quadrant. Pt tolerated injection  Plan:  Next injection due Mar 15-29.    Administrations This Visit     medroxyPROGESTERone (DEPO-PROVERA) injection 150 mg     Admin Date 07/21/2021 Action Given Dose 150 mg Route Intramuscular Administered By Lewayne Bunting, CMA

## 2021-10-06 ENCOUNTER — Ambulatory Visit (INDEPENDENT_AMBULATORY_CARE_PROVIDER_SITE_OTHER): Payer: Medicaid Other | Admitting: Emergency Medicine

## 2021-10-06 ENCOUNTER — Other Ambulatory Visit: Payer: Self-pay

## 2021-10-06 VITALS — BP 144/82 | HR 62 | Ht 68.0 in | Wt 190.8 lb

## 2021-10-06 DIAGNOSIS — Z3042 Encounter for surveillance of injectable contraceptive: Secondary | ICD-10-CM

## 2021-10-06 MED ORDER — MEDROXYPROGESTERONE ACETATE 150 MG/ML IM SUSP
150.0000 mg | Freq: Once | INTRAMUSCULAR | Status: AC
  Administered 2021-10-06: 150 mg via INTRAMUSCULAR

## 2021-10-06 NOTE — Progress Notes (Signed)
Patient was assessed and managed by nursing staff during this encounter. I have reviewed the chart and agree with the documentation and plan. I have also made any necessary editorial changes. ? ?Maikel Neisler A Athanasius Kesling, MD ?10/06/2021 1:20 PM   ?

## 2021-10-06 NOTE — Progress Notes (Signed)
Date last pap: 03/10/20. ?Last Depo-Provera: 07/21/21. ?Side Effects if any: None. ?Serum HCG indicated? No. ?Depo-Provera 150 mg IM given by: Herbie Baltimore, RN in Left upper outer quadrant. Tolerated well.  ?Next appointment due May 31- Jun 14.  ?

## 2021-10-14 ENCOUNTER — Other Ambulatory Visit (HOSPITAL_COMMUNITY)
Admission: RE | Admit: 2021-10-14 | Discharge: 2021-10-14 | Disposition: A | Discharge status: Home / Self Care | Payer: Medicaid Other | Source: Ambulatory Visit | Attending: Family Medicine | Admitting: Family Medicine

## 2021-10-14 ENCOUNTER — Other Ambulatory Visit: Payer: Self-pay

## 2021-10-14 ENCOUNTER — Ambulatory Visit (INDEPENDENT_AMBULATORY_CARE_PROVIDER_SITE_OTHER): Payer: Medicaid Other | Admitting: Family Medicine

## 2021-10-14 VITALS — BP 125/80 | HR 63 | Ht 68.0 in | Wt 193.6 lb

## 2021-10-14 DIAGNOSIS — N898 Other specified noninflammatory disorders of vagina: Secondary | ICD-10-CM | POA: Insufficient documentation

## 2021-10-14 NOTE — Assessment & Plan Note (Signed)
Patient declines STI testing ?Will test for bacterial vaginosis or Candida and follow-up with results once available ?No concerning findings on physical exam ?

## 2021-10-14 NOTE — Progress Notes (Signed)
? ? ?  SUBJECTIVE:  ? ?CHIEF COMPLAINT / HPI: vaginal symptoms ? ?Patient reports that for the last month, she is noticing increased volume and clear vaginal discharge.  She reports that she has had bacterial vaginosis in the past.  She denies any pruritus.  Patient states that she has not been sexually active for 2 months.  Patient reports that she has abnormal periods since being on birth control regularly (depo).  Patient denies any dysuria or abnormal vaginal bleeding.  She denies abdominal pain. ? ?PERTINENT  PMH / PSH:  ?Yeast vaginitis  ? ?OBJECTIVE:  ? ?BP 125/80   Pulse 63   Ht 5\' 8"  (1.727 m)   Wt 193 lb 9.6 oz (87.8 kg)   SpO2 99%   BMI 29.44 kg/m?   ?Genitalia:  Normal introitus for age, no external lesions, clear/white vaginal discharge oozing from external os, mucosa pink and moist, no vaginal or cervical lesions, no vaginal atrophy, no friaility or hemorrhage ? ? ?ASSESSMENT/PLAN:  ? ?Vaginal discharge ?Patient declines STI testing ?Will test for bacterial vaginosis or Candida and follow-up with results once available ?No concerning findings on physical exam ?  ? ? , MD ?[facility]  ?

## 2021-10-14 NOTE — Patient Instructions (Signed)
We will test for bacterial vaginosis or yeast infection with the vaginal swab collected today. ? ?I will notify you of abnormal results. ? ?Please return to care if you begin to have painful urination, vaginal irritation, or notice any changes in the discharge. ? ? ?

## 2021-10-15 LAB — CERVICOVAGINAL ANCILLARY ONLY
Bacterial Vaginitis (gardnerella): NEGATIVE
Candida Glabrata: NEGATIVE
Candida Vaginitis: NEGATIVE
Chlamydia: NEGATIVE
Comment: NEGATIVE
Comment: NEGATIVE
Comment: NEGATIVE
Comment: NEGATIVE
Comment: NEGATIVE
Comment: NORMAL
Neisseria Gonorrhea: NEGATIVE
Trichomonas: NEGATIVE

## 2021-11-08 ENCOUNTER — Ambulatory Visit: Payer: Medicaid Other | Admitting: Student

## 2021-11-08 ENCOUNTER — Ambulatory Visit: Payer: Medicaid Other | Admitting: Family Medicine

## 2021-11-08 NOTE — Progress Notes (Deleted)
? ? ?  SUBJECTIVE:  ? ?CHIEF COMPLAINT / HPI:  ? ?Last seen by Dr. Neita Garnet following 10/14/2021 due to increased vaginal discharge and had negative cervicovaginal ancillary test.  ? ?PERTINENT  PMH / PSH: *** ? ?OBJECTIVE:  ? ?There were no vitals taken for this visit. *** ? ?General: NAD, pleasant, able to participate in exam ?Cardiac: RRR, no murmurs. ?Respiratory: CTAB, normal effort, No wheezes, rales or rhonchi ?Abdomen: Bowel sounds present, nontender, nondistended, no hepatosplenomegaly. ?Extremities: no edema or cyanosis. ?Skin: warm and dry, no rashes noted ?Neuro: alert, no obvious focal deficits ?Psych: Normal affect and mood ? ?ASSESSMENT/PLAN:  ? ?No problem-specific Assessment & Plan notes found for this encounter. ?  ? ? ?Dr. Erick Alley, DO ?[facility]  ? ? ?{    This will disappear when note is signed, click to select method of visit    :1} ? ?

## 2021-11-09 ENCOUNTER — Ambulatory Visit (INDEPENDENT_AMBULATORY_CARE_PROVIDER_SITE_OTHER): Payer: Medicaid Other | Admitting: Family Medicine

## 2021-11-09 ENCOUNTER — Other Ambulatory Visit (HOSPITAL_COMMUNITY)
Admission: RE | Admit: 2021-11-09 | Discharge: 2021-11-09 | Disposition: A | Discharge status: Home / Self Care | Payer: Medicaid Other | Source: Ambulatory Visit | Attending: Family Medicine | Admitting: Family Medicine

## 2021-11-09 ENCOUNTER — Encounter: Payer: Self-pay | Admitting: Family Medicine

## 2021-11-09 DIAGNOSIS — R779 Abnormality of plasma protein, unspecified: Secondary | ICD-10-CM | POA: Diagnosis not present

## 2021-11-09 DIAGNOSIS — B9689 Other specified bacterial agents as the cause of diseases classified elsewhere: Secondary | ICD-10-CM | POA: Diagnosis not present

## 2021-11-09 DIAGNOSIS — N760 Acute vaginitis: Secondary | ICD-10-CM

## 2021-11-09 DIAGNOSIS — N898 Other specified noninflammatory disorders of vagina: Secondary | ICD-10-CM | POA: Diagnosis not present

## 2021-11-09 DIAGNOSIS — Z113 Encounter for screening for infections with a predominantly sexual mode of transmission: Secondary | ICD-10-CM | POA: Diagnosis not present

## 2021-11-09 DIAGNOSIS — Z114 Encounter for screening for human immunodeficiency virus [HIV]: Secondary | ICD-10-CM | POA: Diagnosis not present

## 2021-11-09 LAB — POCT WET PREP (WET MOUNT)
Clue Cells Wet Prep Whiff POC: POSITIVE
Trichomonas Wet Prep HPF POC: ABSENT

## 2021-11-09 MED ORDER — METRONIDAZOLE 500 MG PO TABS: 500.0000 mg | ORAL_TABLET | Freq: Two times a day (BID) | ORAL | 0 refills | Status: AC

## 2021-11-09 NOTE — Assessment & Plan Note (Signed)
Bp remains elevated, especially diastolic after repeat testing. ?No treatment at this time. ?F/U with PCP to reassess discussed. ?She agreed to schedule an appointment.  ?

## 2021-11-09 NOTE — Patient Instructions (Signed)
Vaginitis  Vaginitis is irritation and swelling of the vagina. Treatment will depend on the cause. What are the causes? It can be caused by: Bacteria. Yeast. A parasite. A virus. Low hormone levels. Bubble baths, scented tampons, and feminine sprays. Other things can change the balance of the yeast and bacteria that live in the vagina. These include: Antibiotic medicines. Not being clean enough. Some birth control methods. Sex. Infection. Diabetes. A weakened body defense system (immune system). What increases the risk? Smoking or being around someone who smokes. Using washes (douches), scented tampons, or scented pads. Wearing tight pants or thong underwear. Using birth control pills or an IUD. Having sex without a condom or having a lot of partners. Having an STI. Using a certain product to kill sperm (nonoxynol-9). Eating foods that are high in sugar. Having diabetes. Having low levels of a female hormone. Having a weakened body defense system. Being pregnant or breastfeeding. What are the signs or symptoms? Fluid coming from the vagina that is not normal. A bad smell. Itching, pain, or swelling. Pain with sex. Pain or burning when you pee (urinate). Sometimes there are no symptoms. How is this treated? Treatment may include: Antibiotic creams or pills. Antifungal medicines. Medicines to ease symptoms if you have a virus. Your sex partner should also be treated. Estrogen medicines. Avoiding scented soaps, sprays, or douches. Stopping use of products that caused irritation and then using a cream to treat symptoms. Follow these instructions at home: Lifestyle Keep the area around your vagina clean and dry. Avoid using soap. Rinse the area with water. Until your doctor says it is okay: Do not use washes for the vagina. Do not use tampons. Do not have sex. Wipe from front to back after going to the bathroom. When your doctor says it is okay, practice safe sex  and use condoms. General instructions Take over-the-counter and prescription medicines only as told by your doctor. If you were prescribed an antibiotic medicine, take or use it as told by your doctor. Do not stop taking or using it even if you start to feel better. Keep all follow-up visits. How is this prevented? Do not use things that can irritate the vagina, such as fabric softeners. Avoid these products if they are scented: Sprays. Detergents. Tampons. Products for cleaning the vagina. Soaps or bubble baths. Let air reach your vagina. To do this: Wear cotton underwear. Do not wear: Underwear while you sleep. Tight pants. Thong underwear. Underwear or nylons without a cotton panel. Take off any wet clothing, such as bathing suits, as soon as you can. Practice safe sex and use condoms. Contact a doctor if: You have pain in your belly or in the area between your hips. You have a fever or chills. Your symptoms last for more than 2-3 days. Get help right away if: You have a fever and your symptoms get worse all of a sudden. Summary Vaginitis is irritation and swelling of the vagina. Treatment will depend on the cause of the condition. Do not use washes or tampons or have sex until your doctor says it is okay. This information is not intended to replace advice given to you by your health care provider. Make sure you discuss any questions you have with your health care provider. Document Revised: 01/09/2020 Document Reviewed: 01/09/2020 Elsevier Patient Education  2023 [employer] Inc[employer]  

## 2021-11-09 NOTE — Assessment & Plan Note (Signed)
Wet prep + for BV. ?I called her after visit to discuss test result and management plan. ?I advised her to start probiotic and yoghurt intake to regulate her vagina pH. ?Avoid douching. ?F/U soon with PCP if symptoms persists. ?

## 2021-11-09 NOTE — Assessment & Plan Note (Signed)
I reviewed and discussed her test results from 10/14/21 ?She stated that she has had sex since then and prefers to get retested. ?GC/Chlamydia and Trich repeated. ?HIV/RPR offered and she agreed with testing. ?I will contact her soon with her results.  ?

## 2021-11-09 NOTE — Progress Notes (Signed)
? ? ?  SUBJECTIVE:  ? ?CHIEF COMPLAINT / HPI:  ? ?Vaginal Discharge ?The patient's primary symptoms include genital itching and vaginal discharge. The patient's pertinent negatives include no vaginal bleeding. This is a new (Hx of vaginitis in the past, but not recently) problem. The problem has been waxing and waning. Pregnant now: LMP: Depo. Pertinent negatives include no abdominal pain, flank pain, hematuria or painful intercourse. Vaginal discharge characteristics: Yellowish. Nothing (She started using scented soap a few weeks ago. She d/c  this soap 1 week with some improvement) aggravates the symptoms. She has tried nothing for the symptoms. She is sexually active. Contraceptive use: Depo.  ? ?Elevated BP: No hx of HTN. No cardiopulmonary or neurologic concerns. ? ?PERTINENT  PMH / PSH: PMHx reviewed ? ?OBJECTIVE:  ? ?Vitals:  ? 11/09/21 0944 11/09/21 1007  ?BP: (!) 146/97 (!) 136/93  ?Pulse: 62   ?SpO2: 100%   ?Weight: 198 lb 3.2 oz (89.9 kg)   ?Height: 5\' 7"  (1.702 m)   ? ? ?Physical Exam ?Nursing note reviewed. Exam conducted with a chaperone present Legette).  ?Cardiovascular:  ?   Rate and Rhythm: Normal rate and regular rhythm.  ?   Heart sounds: Normal heart sounds. No murmur heard. ?Pulmonary:  ?   Effort: Pulmonary effort is normal. No respiratory distress.  ?   Breath sounds: Normal breath sounds. No wheezing.  ?Abdominal:  ?   General: Abdomen is flat. Bowel sounds are normal. There is no distension.  ?   Palpations: Abdomen is soft. There is no mass.  ?   Tenderness: There is no abdominal tenderness.  ?Genitourinary: ?   Vagina: Vaginal discharge present.  ?   Cervix: Discharge present. No cervical motion tenderness.  ?   Uterus: Normal.   ?   Adnexa: Right adnexa normal and left adnexa normal.  ?   Comments: Thick yellowish discharge present ?Neurological:  ?   Mental Status: She is alert.  ? ? ? ?ASSESSMENT/PLAN:  ? ?BV (bacterial vaginosis) ?Wet prep + for BV. ?I called her after visit  to discuss test result and management plan. ?I advised her to start probiotic and yoghurt intake to regulate her vagina pH. ?Avoid douching. ?F/U soon with PCP if symptoms persists. ? ?Screen for STD (sexually transmitted disease) ?I reviewed and discussed her test results from 10/14/21 ?She stated that she has had sex since then and prefers to get retested. ?GC/Chlamydia and Trich repeated. ?HIV/RPR offered and she agreed with testing. ?I will contact her soon with her results.  ? ?Elevated blood protein ?Bp remains elevated, especially diastolic after repeat testing. ?No treatment at this time. ?F/U with PCP to reassess discussed. ?She agreed to schedule an appointment.  ?  ? ?10/16/21, MD ?[facility]  ? ?

## 2021-11-10 LAB — CERVICOVAGINAL ANCILLARY ONLY
Chlamydia: NEGATIVE
Comment: NEGATIVE
Comment: NEGATIVE
Comment: NORMAL
Neisseria Gonorrhea: NEGATIVE
Trichomonas: NEGATIVE

## 2021-11-10 LAB — RPR: RPR Ser Ql: NONREACTIVE

## 2021-11-10 LAB — HIV ANTIBODY (ROUTINE TESTING W REFLEX): HIV Screen 4th Generation wRfx: NONREACTIVE

## 2021-11-10 NOTE — Progress Notes (Signed)
Please to inform patient that her gonorrhea, chlamydia and trichomonas tests are negative.

## 2021-12-23 ENCOUNTER — Other Ambulatory Visit: Payer: Self-pay | Admitting: *Deleted

## 2021-12-23 DIAGNOSIS — Z30013 Encounter for initial prescription of injectable contraceptive: Secondary | ICD-10-CM

## 2021-12-23 MED ORDER — MEDROXYPROGESTERONE ACETATE 150 MG/ML IM SUSP: 150.0000 mg | INTRAMUSCULAR | 0 refills | Status: DC

## 2021-12-23 NOTE — Progress Notes (Signed)
1 Refill on Depo sent today. Pt will need AEX to continue Rx.

## 2021-12-28 ENCOUNTER — Encounter: Payer: Self-pay | Admitting: *Deleted

## 2022-01-04 ENCOUNTER — Ambulatory Visit (INDEPENDENT_AMBULATORY_CARE_PROVIDER_SITE_OTHER): Payer: Medicaid Other

## 2022-01-04 DIAGNOSIS — Z3042 Encounter for surveillance of injectable contraceptive: Secondary | ICD-10-CM

## 2022-01-04 MED ORDER — MEDROXYPROGESTERONE ACETATE 150 MG/ML IM SUSP
150.0000 mg | INTRAMUSCULAR | Status: DC
  Administered 2022-01-04: 150 mg via INTRAMUSCULAR

## 2022-01-04 NOTE — Progress Notes (Signed)
Pt is in the office for depo injection, administered in RUOQ and pt tolerated well. Next due Aug 29- Sept 12 .Marland Kitchen Administrations This Visit     medroxyPROGESTERone (DEPO-PROVERA) injection 150 mg     Admin Date 01/04/2022 Action Given Dose 150 mg Route Intramuscular Administered By Katrina Stack, RN

## 2022-01-12 ENCOUNTER — Other Ambulatory Visit (HOSPITAL_COMMUNITY)
Admission: RE | Admit: 2022-01-12 | Discharge: 2022-01-12 | Disposition: A | Discharge status: Home / Self Care | Payer: Medicaid Other | Source: Ambulatory Visit | Attending: Family Medicine | Admitting: Family Medicine

## 2022-01-12 ENCOUNTER — Ambulatory Visit (INDEPENDENT_AMBULATORY_CARE_PROVIDER_SITE_OTHER): Payer: Medicaid Other | Admitting: Family Medicine

## 2022-01-12 VITALS — BP 129/85 | HR 66 | Ht 68.0 in | Wt 198.1 lb

## 2022-01-12 DIAGNOSIS — N898 Other specified noninflammatory disorders of vagina: Secondary | ICD-10-CM

## 2022-01-12 DIAGNOSIS — Z114 Encounter for screening for human immunodeficiency virus [HIV]: Secondary | ICD-10-CM | POA: Diagnosis not present

## 2022-01-12 DIAGNOSIS — Z113 Encounter for screening for infections with a predominantly sexual mode of transmission: Secondary | ICD-10-CM | POA: Diagnosis not present

## 2022-01-12 DIAGNOSIS — A5901 Trichomonal vulvovaginitis: Secondary | ICD-10-CM | POA: Insufficient documentation

## 2022-01-12 DIAGNOSIS — N760 Acute vaginitis: Secondary | ICD-10-CM

## 2022-01-12 DIAGNOSIS — B9689 Other specified bacterial agents as the cause of diseases classified elsewhere: Secondary | ICD-10-CM | POA: Diagnosis not present

## 2022-01-12 LAB — POCT WET PREP (WET MOUNT)
Clue Cells Wet Prep Whiff POC: POSITIVE
WBC, Wet Prep HPF POC: >20

## 2022-01-12 MED ORDER — METRONIDAZOLE 500 MG PO TABS: 500.0000 mg | ORAL_TABLET | Freq: Two times a day (BID) | ORAL | 0 refills | Status: DC

## 2022-01-12 NOTE — Assessment & Plan Note (Signed)
31 y.o. female with vaginal discharge for 3-4days, as well as slight irritation at the introitus.  Physical exam significant for green discharge.  Wet prep performed today shows no clue cells, + whiff, + trich consistent with trichomonas.  Patient is interested in STI screening. Patient has had several episodes of BV, discussed pulse dosing of flagyl vs boric acid suppositories in future. Patient has recently had a female partner, but has had intercourse with one female partner since April. She is UTD with depo provera injection. Offered EPT for both parties, more likely to be transmitted by female partner but could be either partner. Patient to call with DOB and allergies if partners require EPT. Plan: -Wet prep as above.  Will treat with flagyl 500 mg BID x7d. -GC/chlamydia pending, declined HIV/RPR

## 2022-01-12 NOTE — Patient Instructions (Addendum)
It was a pleasure to see you today!  For future episodes of BV, you can try using boric acid suppositories. These can be found at any health food store or online. Be sure to keep away from pets or small children. These are inserted overnight into the vagina when you notice symptoms or when you are likely to have BV after sex or your menstrual cycle. To treat BV and trichomonas: take flagyl 500 mg BID for 7 days. I recommend avoiding alcohol while taking flagyl as you can have a reaction that is like a terrible hangover. Your pap smear is next due in August 2024 We are happy to do expedited partner treatment. I just need to know birthdays and allergies for any partners and will send antibiotics to [facility]. You can call our office at [phone] with this information.    Be Well,  Dr. Leary Roca

## 2022-01-12 NOTE — Assessment & Plan Note (Deleted)
31 y.o. female with vaginal discharge for 3-4days, as well as slight irritation at the introitus.  Physical exam significant for green discharge.  Wet prep performed today shows no clue cells, + whiff, + trich consistent with trichomonas.  Patient is interested in STI screening. Patient has had several episodes of BV, discussed pulse dosing of flagyl vs boric acid suppositories in future. Plan: -Wet prep as above.  Will treat with flagyl 500 mg BID x7d. -GC/chlamydia pending, declined HIV/RPR

## 2022-01-12 NOTE — Progress Notes (Signed)
    SUBJECTIVE:   CHIEF COMPLAINT / HPI:   Vaginal Discharge: Patient is a 31 y.o. female presenting with vaginal discharge for 3-4 days.  She states the discharge is of thin, green/grey consistency.  She does not endorse vaginal odor.  She is interested in screening for sexually transmitted infections today. Sexually active with both men and women. Depo injection done 6/13 at [facility]. UTD with pap- NILM 02/2020, due in 02/2023. BV+ April 2023, 03/2020, 2018.  PERTINENT  PMH / PSH: None relevant  OBJECTIVE:   BP 129/85   Pulse 66   Ht 5\' 8"  (1.727 m)   Wt 198 lb 2 oz (89.9 kg)   SpO2 100%   BMI 30.12 kg/m    General: NAD, pleasant, able to participate in exam Respiratory: Normal effort, no obvious respiratory distress Pelvic: VULVA: normal appearing vulva with no masses, tenderness or lesions, VAGINA: Normal appearing vagina with normal color, no lesions, with scant and green discharge present. CERVIX: No lesions, scant and green discharge present  ASSESSMENT/PLAN:   Trichomonas vaginalis (TV) infection 31 y.o. female with vaginal discharge for 3-4days, as well as slight irritation at the introitus.  Physical exam significant for green discharge.  Wet prep performed today shows no clue cells, + whiff, + trich consistent with trichomonas.  Patient is interested in STI screening. Patient has had several episodes of BV, discussed pulse dosing of flagyl vs boric acid suppositories in future. Patient has recently had a female partner, but has had intercourse with one female partner since April. She is UTD with depo provera injection. Offered EPT for both parties, more likely to be transmitted by female partner but could be either partner. Patient to call with DOB and allergies if partners require EPT. Plan: -Wet prep as above.  Will treat with flagyl 500 mg BID x7d. -GC/chlamydia pending, declined HIV/RPR    May, MD [facility]

## 2022-01-13 LAB — CERVICOVAGINAL ANCILLARY ONLY
Chlamydia: NEGATIVE
Comment: NEGATIVE
Comment: NORMAL
Neisseria Gonorrhea: NEGATIVE

## 2022-02-22 NOTE — Progress Notes (Deleted)
  SUBJECTIVE:   CHIEF COMPLAINT / HPI:   STI check - recently became sexually active with a new partner, wants to be checked for STIs.  - preferred gender of partner: *** - Medications tried: *** - Sexually active with *** *** partner(s) - Last sexual encounter: *** - Contraception: *** Symptoms - Abnormal vaginal discharge: *** - Missed period: *** - Fever: *** - Abdominal/Pelvic pain: *** - Vaginal bleeding: *** - Pain during sex: *** - Rash: ***   Depo last done 6/13 with [facility]  UTD with pap- NILM 02/2020, due 02/2023. BV + April 2023, June 2023 Trich + 12/2021  Treated appropriately with [facility] at that time  Documented remote history of chlamydia, treated and has been negative last 2 checks   PERTINENT  PMH / PSH: ***  Past Medical History:  Diagnosis Date   Bipolar 1 disorder (HCC)    Chlamydia contact, treated    Depression    pp depression after first and second children no counseling or meds    OBJECTIVE:  There were no vitals taken for this visit.  General: NAD, pleasant, able to participate in exam Cardiac: RRR, no murmurs auscultated Respiratory: CTAB, normal WOB Abdomen: soft, non-tender, non-distended, normoactive bowel sounds Extremities: warm and well perfused, no edema or cyanosis Skin: warm and dry, no rashes noted Neuro: alert, no obvious focal deficits, speech normal GU: *** Psych: Normal affect and mood  ASSESSMENT/PLAN:  No problem-specific Assessment & Plan notes found for this encounter.   No orders of the defined types were placed in this encounter.  No orders of the defined types were placed in this encounter.  No follow-ups on file. Alfredo Martinez, MD 02/22/2022, 1:47 PM PGY-2, [facility] {    This will disappear when note is signed, click to select method of visit    :1}

## 2022-02-23 ENCOUNTER — Ambulatory Visit: Payer: Commercial Managed Care - HMO | Admitting: Student

## 2022-02-23 NOTE — Assessment & Plan Note (Deleted)
Reviewed labs and allergies, offered condoms, will check HIV, RPR, GC/Chlamydia, Trich, wet prep and will call patient with results. Discussed safe sex practices. Pap UTD. Patient's questions answered to their satisfaction. **

## 2022-03-03 ENCOUNTER — Telehealth: Payer: Self-pay

## 2022-03-07 ENCOUNTER — Ambulatory Visit (INDEPENDENT_AMBULATORY_CARE_PROVIDER_SITE_OTHER): Payer: Commercial Managed Care - HMO | Admitting: Advanced Practice Midwife

## 2022-03-07 ENCOUNTER — Other Ambulatory Visit (HOSPITAL_COMMUNITY)
Admission: RE | Admit: 2022-03-07 | Discharge: 2022-03-07 | Disposition: A | Discharge status: Home / Self Care | Payer: Commercial Managed Care - HMO | Source: Ambulatory Visit | Attending: Advanced Practice Midwife | Admitting: Advanced Practice Midwife

## 2022-03-07 ENCOUNTER — Ambulatory Visit (INDEPENDENT_AMBULATORY_CARE_PROVIDER_SITE_OTHER): Payer: Commercial Managed Care - HMO | Admitting: Licensed Clinical Social Worker

## 2022-03-07 ENCOUNTER — Encounter: Payer: Self-pay | Admitting: Advanced Practice Midwife

## 2022-03-07 ENCOUNTER — Ambulatory Visit: Payer: Commercial Managed Care - HMO | Admitting: Advanced Practice Midwife

## 2022-03-07 VITALS — BP 136/91 | HR 72 | Ht 68.0 in | Wt 204.8 lb

## 2022-03-07 DIAGNOSIS — Z113 Encounter for screening for infections with a predominantly sexual mode of transmission: Secondary | ICD-10-CM | POA: Insufficient documentation

## 2022-03-07 DIAGNOSIS — Z01419 Encounter for gynecological examination (general) (routine) without abnormal findings: Secondary | ICD-10-CM | POA: Diagnosis not present

## 2022-03-07 DIAGNOSIS — Z1331 Encounter for screening for depression: Secondary | ICD-10-CM | POA: Diagnosis not present

## 2022-03-07 DIAGNOSIS — R102 Pelvic and perineal pain unspecified side: Secondary | ICD-10-CM

## 2022-03-07 DIAGNOSIS — F3131 Bipolar disorder, current episode depressed, mild: Secondary | ICD-10-CM | POA: Diagnosis not present

## 2022-03-07 DIAGNOSIS — F4321 Adjustment disorder with depressed mood: Secondary | ICD-10-CM

## 2022-03-07 DIAGNOSIS — F439 Reaction to severe stress, unspecified: Secondary | ICD-10-CM | POA: Diagnosis not present

## 2022-03-07 MED ORDER — NAPROXEN SODIUM 550 MG PO TABS: 550.0000 mg | ORAL_TABLET | Freq: Two times a day (BID) | ORAL | 0 refills | Status: DC

## 2022-03-07 NOTE — Progress Notes (Signed)
Subjective:     Sandra Anderson is a 31 y.o. female here at [facility] for a routine exam.  Current complaints: pelvic pain, acute onset 8/10 pain in her RLQ last night, improved but still present today.  She has hx mild cramping R and L lower abdomen but never this bad.  She is on Depo for contraception, last injection 6/1 3/23. She is not currently sexually active.  She reports recent tx for trichomonas and negative serum STI testing.    She reports recent life stressors causing some depression/anxiety. She has hx bipolar on medications, but she has been stable off of medications for several years. She is not currently seeing a Veterinary surgeon.  Personal health questionnaire reviewed: yes.  Do you have a primary care provider? yes Do you feel safe at home? yes  Flowsheet Row Office Visit from 03/07/2022 in [facility]  PHQ-2 Total Score 3       Health Maintenance Due  Topic Date Due   Hepatitis C Screening  Never done   TETANUS/TDAP  Never done   INFLUENZA VACCINE  02/22/2022     Risk factors for chronic health problems: Smoking: former cigarette smoker, quit 2019 Alchohol/how much: None Pt BMI: Body mass index is 31.14 kg/m.   Gynecologic History No LMP recorded. Patient has had an injection. Contraception: Depo-Provera injections Last Pap: 03/10/2020. Results were: normal Last mammogram: n/a.   Obstetric History OB History  Gravida Para Term Preterm AB Living  5 4 4  0 1 4  SAB IAB Ectopic Multiple Live Births  0 0 1 0 4    # Outcome Date GA Lbr Len/2nd Weight Sex Delivery Anes PTL Lv  5 Term 10/01/17 [redacted]w[redacted]d 02:20 / 00:04 7 lb 6 oz (3.345 kg) F Vag-Spont None  LIV  4 Ectopic 05/24/16 [redacted]w[redacted]d         3 Term 03/19/15 [redacted]w[redacted]d 04:07 / 00:04 7 lb 7.8 oz (3.395 kg) F Vag-Spont EPI  LIV     Birth Comments: WNL  2 Term 05/04/11 [redacted]w[redacted]d 06:25 / 00:11 6 lb 13 oz (3.09 kg) M Vag-Spont EPI  LIV  1 Term 05/12/10 [redacted]w[redacted]d   M Vag-Spont None N LIV     The following  portions of the patient's history were reviewed and updated as appropriate: allergies, current medications, past family history, past medical history, past social history, past surgical history, and problem list.  Review of Systems Pertinent items noted in HPI and remainder of comprehensive ROS otherwise negative.    Objective:   BP (!) 136/91   Pulse 72   Ht 5\' 8"  (1.727 m)   Wt 204 lb 12.8 oz (92.9 kg)   BMI 31.14 kg/m  VS reviewed, nursing note reviewed,  Constitutional: well developed, well nourished, no distress HEENT: normocephalic CV: normal rate Pulm/chest wall: normal effort Breast Exam:  Deferred with low risks and shared decision making, discussed recommendation to start mammogram between 40-50 yo/ Abdomen: soft, no rebound tenderness or guarding Neuro: alert and oriented x 3 Skin: warm, dry Psych: affect normal Pelvic exam:  Bimanual exam: Cervix 0/long/high, firm, anterior, neg CMT, uterus nontender, nonenlarged, adnexa without tenderness, enlargement, or mass       Assessment/Plan:   1. Routine screening for STI (sexually transmitted infection) --Pt had recent serum testing that was negative  - Cervicovaginal ancillary only( [facility])  2. Acute pelvic pain, female --This is an acute uncomplicated problem  --Unlikely ovulatory pain with Depo --Exam with adnexal  tenderness, no palpable mass or enlargement, no CMT --Rx for naproxen BID when pt has pain --F/U with OP ultrasound  - US PELVIC COMPLETE WITH TRANSVAGINAL; Future  3. Well woman exam with routine gynecological exam   4. Positive screening for depression on 9-item Patient Health Questionnaire (PHQ-9) --This is a chronic illness with exacerbation --Pt with life stressors, not doing as well as she has for last few years --Sue Lush to see pt today in office  - Ambulatory referral to Integrated Behavioral Health  5. Bipolar affective disorder, currently depressed, mild (HCC)  - Ambulatory  referral to Integrated Behavioral Health     No follow-ups on file.   Sharen Counter, CNM 9:24 AM

## 2022-03-07 NOTE — Progress Notes (Deleted)
   Subjective:     Sandra Anderson is a 31 y.o. female here at [facility] *** for a routine exam.  Current complaints: ***.  Personal health questionnaire reviewed: {yes/no:9010}.  Do you have a primary care provider? *** Do you feel safe at home? ***  Flowsheet Row Office Visit from 10/14/2021 in [facility]  PHQ-2 Total Score 4       Health Maintenance Due  Topic Date Due   Hepatitis C Screening  Never done   TETANUS/TDAP  Never done   INFLUENZA VACCINE  02/22/2022     Risk factors for chronic health problems: Smoking: Alchohol/how much: Pt BMI: There is no height or weight on file to calculate BMI.   Gynecologic History No LMP recorded. Patient has had an injection. Contraception: {method:5051} Last Pap: 03/10/20. Results were: normal Last mammogram: ***. Results were: {norm/abn:16337}  Obstetric History OB History  Gravida Para Term Preterm AB Living  5 4 4  0 1 4  SAB IAB Ectopic Multiple Live Births  0 0 1 0 4    # Outcome Date GA Lbr Len/2nd Weight Sex Delivery Anes PTL Lv  5 Term 10/01/17 [redacted]w[redacted]d 02:20 / 00:04 7 lb 6 oz (3.345 kg) F Vag-Spont None  LIV  4 Ectopic 05/24/16 [redacted]w[redacted]d         3 Term 03/19/15 [redacted]w[redacted]d 04:07 / 00:04 7 lb 7.8 oz (3.395 kg) F Vag-Spont EPI  LIV     Birth Comments: WNL  2 Term 05/04/11 [redacted]w[redacted]d 06:25 / 00:11 6 lb 13 oz (3.09 kg) M Vag-Spont EPI  LIV  1 Term 05/12/10 [redacted]w[redacted]d   M Vag-Spont None N LIV     {Common ambulatory SmartLinks:19316}  Review of Systems {ros; complete:30496}    Objective:   There were no vitals taken for this visit. VS reviewed, nursing note reviewed,  Constitutional: well developed, well nourished, no distress HEENT: normocephalic CV: normal rate Pulm/chest wall: normal effort Breast Exam:  ***Deferred with low risks and shared decision making, discussed recommendation to start mammogram between 40-50 yo/ exam performed: right breast normal without mass, skin or nipple changes or axillary nodes, left  breast normal without mass, skin or nipple changes or axillary nodes Abdomen: soft Neuro: alert and oriented x 3 Skin: warm, dry Psych: affect normal Pelvic exam: ***Deferred/ Performed: Cervix pink, visually closed, without lesion, scant white creamy discharge, vaginal walls and external genitalia normal Bimanual exam: Cervix 0/long/high, firm, anterior, neg CMT, uterus nontender, nonenlarged, adnexa without tenderness, enlargement, or mass       Assessment/Plan:   There are no diagnoses linked to this encounter.     No follow-ups on file.   [redacted]w[redacted]d, CNM 8:20 AM

## 2022-03-07 NOTE — Progress Notes (Signed)
Pt is in the office for annual Last pap 03/10/2020 Complains of constant Right sided pelvic pain 8/10 that started last night and affected her ability to sleep. Pt denies taking any pain medication. PHQ-9= 7  GAD-7= 11

## 2022-03-08 ENCOUNTER — Ambulatory Visit: Payer: Commercial Managed Care - HMO | Admitting: Obstetrics and Gynecology

## 2022-03-08 LAB — CERVICOVAGINAL ANCILLARY ONLY
Bacterial Vaginitis (gardnerella): NEGATIVE
Candida Glabrata: NEGATIVE
Candida Vaginitis: NEGATIVE
Chlamydia: NEGATIVE
Comment: NEGATIVE
Comment: NEGATIVE
Comment: NEGATIVE
Comment: NEGATIVE
Comment: NEGATIVE
Comment: NORMAL
Neisseria Gonorrhea: NEGATIVE
Trichomonas: NEGATIVE
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
## 2022-03-10 ENCOUNTER — Ambulatory Visit: Admission: RE | Admit: 2022-03-10 | Payer: Commercial Managed Care - HMO | Source: Ambulatory Visit

## 2022-03-16 ENCOUNTER — Telehealth: Payer: Self-pay | Admitting: *Deleted

## 2022-03-16 NOTE — Telephone Encounter (Signed)
TC to notify pt of prior auth needed for Naproxen sodium. Notified that PA process could be done, but med may still not be covered. Notified of alternative of taking 2 OTC Aleve as needed for pain per Fredrich Birks, CNM recommendation. Pt elects to try Aleve. PA not completed.

## 2022-04-04 ENCOUNTER — Other Ambulatory Visit: Payer: Self-pay

## 2022-04-04 ENCOUNTER — Ambulatory Visit: Payer: Commercial Managed Care - HMO

## 2022-04-04 ENCOUNTER — Ambulatory Visit (INDEPENDENT_AMBULATORY_CARE_PROVIDER_SITE_OTHER): Payer: Commercial Managed Care - HMO

## 2022-04-04 VITALS — BP 126/82 | HR 60 | Ht 68.0 in | Wt 209.0 lb

## 2022-04-04 DIAGNOSIS — Z3042 Encounter for surveillance of injectable contraceptive: Secondary | ICD-10-CM | POA: Diagnosis not present

## 2022-04-04 MED ORDER — MEDROXYPROGESTERONE ACETATE 150 MG/ML IM SUSP
150.0000 mg | Freq: Once | INTRAMUSCULAR | Status: AC
  Administered 2022-04-04: 150 mg via INTRAMUSCULAR

## 2022-04-04 MED ORDER — MEDROXYPROGESTERONE ACETATE 150 MG/ML IM SUSP: 150.0000 mg | INTRAMUSCULAR | 4 refills | Status: DC

## 2022-04-04 NOTE — Progress Notes (Addendum)
SUBJECTIVE: Sandra Anderson is a 31 y.o. female who presents for DEPO Injection.   OBJECTIVE: Appears well, in no apparent distress.  Vital signs are normal.  ASSESSMENT: Need for [facility]  PLAN: DEPO Injection given in [facility], tolerated well.  Next DEPO due 11/27 - 07/04/2022  Administrations This Visit     medroxyPROGESTERone (DEPO-PROVERA) injection 150 mg     Admin Date 04/04/2022 Action Given Dose 150 mg Route Intramuscular Administered By Maretta Bees, RMA

## 2022-04-04 NOTE — Progress Notes (Signed)
DEPO Rx sent to [facility]

## 2022-04-21 ENCOUNTER — Encounter (INDEPENDENT_AMBULATORY_CARE_PROVIDER_SITE_OTHER): Payer: Self-pay | Admitting: Internal Medicine

## 2022-04-21 ENCOUNTER — Ambulatory Visit (INDEPENDENT_AMBULATORY_CARE_PROVIDER_SITE_OTHER): Payer: Commercial Managed Care - HMO | Admitting: Internal Medicine

## 2022-04-21 VITALS — BP 125/81 | HR 88 | Temp 98.2°F | Ht 67.0 in | Wt 199.0 lb

## 2022-04-21 DIAGNOSIS — Z0289 Encounter for other administrative examinations: Secondary | ICD-10-CM

## 2022-04-21 DIAGNOSIS — E669 Obesity, unspecified: Secondary | ICD-10-CM

## 2022-04-21 DIAGNOSIS — Z6831 Body mass index (BMI) 31.0-31.9, adult: Secondary | ICD-10-CM

## 2022-04-21 DIAGNOSIS — R5383 Other fatigue: Secondary | ICD-10-CM

## 2022-04-21 NOTE — Progress Notes (Signed)
Office: [phone]  /  Fax: [phone]  Initial Visit  Sandra Anderson was seen in clinic today to evaluate for obesity. She is interested in losing weight to improve overall health and reduce the risk of weight related complications. Peak weight 203, desired weight 150. She is not following a nutritional plan or physical activity. She notes weight since she started depo provera for birth control. She is interested in learning more about healthy eating and improving over health. Endorses having fatigue and low energy at times. Denies other obesity related complications.   She presents today to review program treatment options, initial physical assessment, and evaluation.      Past medical history includes:   Past Medical History:  Diagnosis Date   Bipolar 1 disorder (HCC)    Chlamydia contact, treated    Depression    pp depression after first and second children no counseling or meds     Objective:   BP 125/81   Pulse 88   Temp 98.2 F (36.8 C)   Ht 5\' 7"  (1.702 m)   Wt 199 lb (90.3 kg)   SpO2 99%   BMI 31.17 kg/m  She was weighed on the bioimpedance scale:  Body mass index is 31.17 kg/m.  General:  Alert, oriented and cooperative. Patient is in no acute distress.  Respiratory: Normal respiratory effort, no problems with respiration noted  Extremities: Normal range of motion.    Mental Status: Normal mood and affect. Normal behavior. Normal judgment and thought content.   Assessment and Plan:  1. Class 1 obesity without serious comorbidity with body mass index (BMI) of 31.0 to 31.9 in adult, unspecified obesity type We discussed BMI, associated conditions and contributing factors.  She has been on Depo-Provera and has noticed increasing weight since being on this form of birth control.  She is not on a nutritional plan and although works as a she does not engage in regular physical activity.  We need to determine her total daily energy expenditure and  formulate a reduced calorie nutritional plan to initiate weight loss process.  She also needs counseling on physical activity.  Patient advised to discuss alternative birth control with her gynecologist on that would be weight neutral.  2. Other fatigue     Obesity Treatment Plan:  She will work on garnering support from family and friends to begin weight loss journey. Work on eliminating or reducing the presence of highly processed, calorie dense foods in the home. Complete provided nutritional and psychosocial assessment questionnaire if she decides to return.     We discussed that sometimes medications may cause weight gain and she notes gaining weight since starting provera. Advised to schedule an appointment with gynecologist to discuss alternatives as this may help with weight loss.  Obesity Education Performed Today:  She was weighed on the bioimpedance scale and results were discussed and documented in the synopsis.  We discussed obesity as a disease and the importance of a more detailed evaluation of all the factors contributing to the disease.  We discussed the importance of long term lifestyle changes which include nutrition, exercise and behavioral modifications as well as the importance of customizing this to her specific health and social needs.  We discussed the benefits of reaching a healthier weight to alleviate the symptoms of existing conditions and reduce the risks of the biomechanical, metabolic and psychological effects of obesity.  We discussed the goals of this program is to improve her overall health and not simply achieve  a specific BMI.  Frequent visits are very important to patient success. I explained obesity is a life-long chronic disease and long term treatments would be required. Medications to help her follow his eating plan may be offered as appropriate but are not required. All medication decisions will be made together after the initial workup is done and  benefits and side effects are discussed in depth.  The clinic rules were reviewed including the late policy, cancellation policy, no show and program fees.  Clenton Pare appears to be in the action stage of change and states they are ready to start intensive lifestyle modifications and behavioral modifications.  30 minutes was spent today on this visit including the above counseling, pre-visit chart review, and post-visit documentation.  Thomes Dinning, MD

## 2022-05-04 ENCOUNTER — Ambulatory Visit: Payer: Commercial Managed Care - HMO

## 2022-05-17 ENCOUNTER — Ambulatory Visit (HOSPITAL_COMMUNITY)
Admission: EM | Admit: 2022-05-17 | Discharge: 2022-05-17 | Disposition: A | Discharge status: Home / Self Care | Payer: Commercial Managed Care - HMO | Attending: Internal Medicine | Admitting: Internal Medicine

## 2022-05-17 ENCOUNTER — Encounter (HOSPITAL_COMMUNITY): Payer: Self-pay | Admitting: Emergency Medicine

## 2022-05-17 DIAGNOSIS — K047 Periapical abscess without sinus: Secondary | ICD-10-CM | POA: Diagnosis not present

## 2022-05-17 DIAGNOSIS — K0889 Other specified disorders of teeth and supporting structures: Secondary | ICD-10-CM

## 2022-05-17 MED ORDER — AMOXICILLIN-POT CLAVULANATE 875-125 MG PO TABS: 1.0000 | ORAL_TABLET | Freq: Two times a day (BID) | ORAL | 0 refills | Status: DC

## 2022-05-17 MED ORDER — IBUPROFEN 800 MG PO TABS: 800.0000 mg | ORAL_TABLET | Freq: Three times a day (TID) | ORAL | 0 refills | Status: DC

## 2022-05-17 MED ORDER — KETOROLAC TROMETHAMINE 30 MG/ML IJ SOLN
30.0000 mg | Freq: Once | INTRAMUSCULAR | Status: AC
  Administered 2022-05-17: 30 mg via INTRAMUSCULAR

## 2022-05-17 MED ORDER — KETOROLAC TROMETHAMINE 30 MG/ML IJ SOLN
INTRAMUSCULAR | Status: AC
  Filled 2022-05-17: qty 1

## 2022-05-17 NOTE — ED Triage Notes (Addendum)
Left dental pain and swelling that started today. Pt states that she is seeing a doctor Friday about taking out her wisdom teeth

## 2022-05-17 NOTE — ED Provider Notes (Signed)
[place]    CSN: [id number] Arrival date & time: 05/17/22  1729      History   Chief Complaint Chief Complaint  Patient presents with   Dental Pain    HPI Sandra Anderson is a 31 y.o. female.   Patient presents to urgent care for evaluation of left-sided dental pain that started yesterday and worsened into today.  She has an appointment 2 days from now for evaluation and possible removal of her left-sided upper and lower wisdom teeth.  She has had the wisdom teeth on the right side removed in the past.  Reporting significant pain and swelling to the left side of her mouth causing her to have a difficult time swallowing due to the pain and discomfort.  She has been performing salt water gargles at home prior to arrival urgent care for symptoms and denies use of any anti-inflammatory medications.  Denies recent use of any antibiotics. She brushes her teeth regularly. No recent dental work. Denies fever/chills, nausea, vomiting, diarrhea, ear pain, and cough.  Pain is currently a 10 on a scale of 0-10 to the left generalized mouth.   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   BV (bacterial vaginosis) 04/14/2020   Screen for STD (sexually transmitted disease) 12/04/2019   Schizophrenia ([place]) 06/21/2017   Bipolar affective ([place]) 06/21/2017   ASCUS of cervix with negative high risk HPV 06/21/2017   History of ectopic pregnancy 03/03/2017    Past Surgical History:  Procedure Laterality Date   MOUTH SURGERY       OB History     Gravida  5   Para  4   Term  4   Preterm  0   AB  1   Living  4      SAB  0   IAB  0   Ectopic  1   Multiple  0   Live Births  4            Home Medications    Prior to Admission medications   Medication Sig Start Date End Date Taking? Authorizing Provider  amoxicillin-clavulanate (AUGMENTIN) 875-125 MG tablet Take 1 tablet by mouth every 12 (twelve) hours. 05/17/22  Yes Talbot Grumbling, FNP  ibuprofen (ADVIL) 800 MG tablet Take 1 tablet (800 mg total) by mouth 3 (three) times daily. 05/17/22  Yes Talbot Grumbling, FNP  ARIPiprazole (ABILIFY) 5 MG tablet Take 5 mg by mouth daily.    [provider]  fluconazole (DIFLUCAN) 150 MG tablet Take 1 tablet (150 mg total) by mouth every 3 (three) days. 02/05/21   Brimage, Ronnette Juniper, DO  Imiquimod 2.5 % CREA Apply to effected area 3 times per week up to 8 weeks or until resolved. 04/14/20   Richarda Osmond, MD  medroxyPROGESTERone (DEPO-PROVERA) 150 MG/ML injection Inject 1 mL (150 mg total) into the muscle every 3 (three) months. 12/23/21   Shelly Bombard, MD  medroxyPROGESTERone (DEPO-PROVERA) 150 MG/ML injection Inject 1 mL (150 mg total) into  the muscle every 3 (three) months. 04/04/22   Warden Fillers, MD  nystatin-triamcinolone [facility] II) cream Apply to affected area daily 08/30/19   Dahlia Byes A, NP  Prenatal Vit-Fe Fumarate-FA (PRENATAL VITAMIN) 27-0.8 MG TABS Take 1 tablet by mouth daily. 11/29/19   Leeroy Bock, MD  triamcinolone (KENALOG) 0.1 % Apply 1 application topically 2 (two) times daily. 07/01/20   Particia Nearing, PA-C    Family History Family History  Problem Relation Age of Onset   Hypertension Mother    Cirrhosis Mother    Alcohol abuse Mother     Social History Social History   Tobacco Use   Smoking status: Former    Packs/day: 0.25    Years: 2.00    Total pack years: 0.50    Types: Cigarettes    Quit date: 08/03/2017    Years since  quitting: 4.7   Smokeless tobacco: Former  Building services engineer Use: Never used  Substance Use Topics   Alcohol use: No    Comment: occasionally   Drug use: No     Allergies   Patient has no known allergies.   Review of Systems Review of Systems Per HPI  Physical Exam Triage Vital Signs ED Triage Vitals  Enc Vitals Group     BP 05/17/22 1820 (!) 144/97     Pulse Rate 05/17/22 1820 (!) 57     Resp 05/17/22 1820 17     Temp 05/17/22 1820 98 F (36.7 C)     Temp Source 05/17/22 1820 Oral     SpO2 05/17/22 1820 98 %     Weight --      Height --      Head Circumference --      Peak Flow --      Pain Score 05/17/22 1818 10     Pain Loc --      Pain Edu? --      Excl. in GC? --    No data found.  Updated Vital Signs BP (!) 144/97 (BP Location: Left Arm)   Pulse (!) 57   Temp 98 F (36.7 C) (Oral)   Resp 17   SpO2 98%   Visual Acuity Right Eye Distance:   Left Eye Distance:   Bilateral Distance:    Right Eye Near:   Left Eye Near:    Bilateral Near:     Physical Exam Vitals and nursing note reviewed.  Constitutional:      Appearance: She is ill-appearing. She is not toxic-appearing.  HENT:     Head: Normocephalic and atraumatic.     Right Ear: Hearing, tympanic membrane, ear canal and external ear normal.     Left Ear: Hearing, tympanic membrane, ear canal and external ear normal.     Nose: Nose normal. No congestion or rhinorrhea.     Mouth/Throat:     Lips: Pink.     Mouth: Mucous membranes are moist.     Dentition: No dental abscesses.     Tongue: Tongue does not deviate from midline.     Pharynx: Oropharynx is clear. Uvula midline. No pharyngeal swelling, oropharyngeal exudate, posterior oropharyngeal erythema or uvula swelling.     Tonsils: No tonsillar exudate or tonsillar abscesses. 1+ on the right. 1+ on the left.      Comments: Gingival swelling present to the generalized left side of the mouth. Dental decay and infection visualized to the  teeth outlined above to the left side of the  mouth. Warmth to palpation of left maxillary facial area. Visible maxillary swelling present. Airway is intact, no respiratory distress or breathing difficulty.   Eyes:     General: Lids are normal. Vision grossly intact. Gaze aligned appropriately.     Extraocular Movements: Extraocular movements intact.     Conjunctiva/sclera: Conjunctivae normal.  Pulmonary:     Effort: Pulmonary effort is normal.  Musculoskeletal:     Cervical back: Neck supple.  Lymphadenopathy:     Cervical: Cervical adenopathy present.  Skin:    General: Skin is warm and dry.     Capillary Refill: Capillary refill takes less than 2 seconds.     Findings: No rash.  Neurological:     General: No focal deficit present.     Mental Status: She is alert and oriented to person, place, and time. Mental status is at baseline.     Cranial Nerves: No dysarthria or facial asymmetry.  Psychiatric:        Mood and Affect: Mood normal.        Speech: Speech normal.        Behavior: Behavior normal.        Thought Content: Thought content normal.        Judgment: Judgment normal.      UC Treatments / Results  Labs (all labs ordered are listed, but only abnormal results are displayed) Labs Reviewed - No data to display  EKG   Radiology No results found.  Procedures Procedures (including critical care time)  Medications Ordered in UC Medications  ketorolac (TORADOL) 30 MG/ML injection 30 mg (has no administration in time range)    Initial Impression / Assessment and Plan / UC Course  I have reviewed the triage vital signs and the nursing notes.  Pertinent labs & imaging results that were available during my care of the patient were reviewed by me and considered in my medical decision making (see chart for details).   1.  Dental pain and dental infection Augmentin twice daily for the next 7 days prescribed.  Offered ibuprofen 800 mg versus ketorolac 30 mg  injection in clinic and patient would like to proceed with ketorolac injection to help with dental pain.  She may to start using ibuprofen tomorrow every 8 hours as needed for pain and inflammation.  Soft foods for the next few days advised.  Salt water gargles to be continued.  If she develops any new or worsening symptoms, voice changes, shortness of breath, or feelings of throat closing, she is to return to urgent care or go to the nearest emergency department for further evaluation if symptoms are severe.  Follow-up as planned to possibly have left-sided wisdom teeth removed on Thursday for further evaluation.  Discussed physical exam and available lab work findings in clinic with patient.  Counseled patient regarding appropriate use of medications and potential side effects for all medications recommended or prescribed today. Discussed red flag signs and symptoms of worsening condition,when to call the PCP office, return to urgent care, and when to seek higher level of care in the emergency department. Patient verbalizes understanding and agreement with plan. All questions answered. Patient discharged in stable condition.   Final Clinical Impressions(s) / UC Diagnoses   Final diagnoses:  Pain, dental  Dental infection     Discharge Instructions      We gave you an injection of ketorolac in the clinic to help with your pain. You may start taking ibuprofen tomorrow morning to help with the  pain by taking this every 8 hours as needed for pain and inflammation to the mouth.  Take this medicine with food to avoid stomach upset.  Eat soft foods over the next few days while you are waiting to have your teeth removed.  Take Augmentin antibiotic starting tonight twice daily for the next 7 days for infection.  Gargle with salt water.  If you develop any new or worsening symptoms or do not improve in the next 2 to 3 days, please return.  If your symptoms are severe, please go to the emergency  room.  Follow-up with your primary care provider for further evaluation and management of your symptoms as well as ongoing wellness visits.  I hope you feel better!   ED Prescriptions     Medication Sig Dispense Auth. Provider   amoxicillin-clavulanate (AUGMENTIN) 875-125 MG tablet Take 1 tablet by mouth every 12 (twelve) hours. 14 tablet Reita May M, FNP   ibuprofen (ADVIL) 800 MG tablet Take 1 tablet (800 mg total) by mouth 3 (three) times daily. 21 tablet Carlisle Beers, FNP      PDMP not reviewed this encounter.   Carlisle Beers, [place] 05/17/22 1911

## 2022-05-17 NOTE — Discharge Instructions (Signed)
We gave you an injection of ketorolac in the clinic to help with your pain. You may start taking ibuprofen tomorrow morning to help with the pain by taking this every 8 hours as needed for pain and inflammation to the mouth.  Take this medicine with food to avoid stomach upset.  Eat soft foods over the next few days while you are waiting to have your teeth removed.  Take Augmentin antibiotic starting tonight twice daily for the next 7 days for infection.  Gargle with salt water.  If you develop any new or worsening symptoms or do not improve in the next 2 to 3 days, please return.  If your symptoms are severe, please go to the emergency room.  Follow-up with your primary care provider for further evaluation and management of your symptoms as well as ongoing wellness visits.  I hope you feel better!

## 2022-05-24 ENCOUNTER — Telehealth: Payer: Self-pay

## 2022-05-24 ENCOUNTER — Other Ambulatory Visit (HOSPITAL_COMMUNITY)
Admission: RE | Admit: 2022-05-24 | Discharge: 2022-05-24 | Disposition: A | Discharge status: Home / Self Care | Payer: Commercial Managed Care - HMO | Source: Ambulatory Visit | Attending: Family Medicine | Admitting: Family Medicine

## 2022-05-24 ENCOUNTER — Ambulatory Visit (INDEPENDENT_AMBULATORY_CARE_PROVIDER_SITE_OTHER): Payer: Commercial Managed Care - HMO | Admitting: Student

## 2022-05-24 VITALS — BP 118/84 | HR 72 | Wt 207.0 lb

## 2022-05-24 DIAGNOSIS — N898 Other specified noninflammatory disorders of vagina: Secondary | ICD-10-CM

## 2022-05-24 LAB — POCT WET PREP (WET MOUNT)
Clue Cells Wet Prep Whiff POC: NEGATIVE
Trichomonas Wet Prep HPF POC: ABSENT

## 2022-05-24 MED ORDER — FLUCONAZOLE 150 MG PO TABS: 150.0000 mg | ORAL_TABLET | Freq: Once | ORAL | 0 refills | Status: AC

## 2022-05-24 NOTE — Progress Notes (Signed)
    SUBJECTIVE:   CHIEF COMPLAINT / HPI:   31 y.o.  year old female presents with complaint of Vaginal irritation for 4 days This started after she was started on Augmentin after dental extraction Denies increased frequency and urgency.  She is sexually active with one partner and inconsistent with protection. Denies vaginal discharge, itchiness or odor. No hematuria No recent change in soap but reports recently shaving her pelvic area. Unsure of LMP because she's on Depo  Pap is up to date and due in 2024.    PERTINENT  PMH / PSH: Reviwed  OBJECTIVE:   BP 118/84   Pulse 72   Wt 207 lb (93.9 kg)   SpO2 98%   BMI 32.42 kg/m    Physical Exam General: Alert, well appearing, NAD Cardiovascular: RRR, No Murmurs, Normal S2/S2 Respiratory: CTAB, No wheezing or Rales Genitalia Exam performed by Dr. Owens Shark. See her addendum to the note  ASSESSMENT/PLAN:  Vaginal irritation Patient with complain of vaginal irritation. Her recent use of antibiotics puts her at increased risk of yeast infection or bacterial vaginosis from alt. -Obtain labs for wet prep. -Follow up with lab for GC, Chlamydia, Trichomonas.   Note addendum Addended note after wet prep resulted.  Wet prep results consistent with yeast infection.  Called patient's about results and informed patient we will start her on fluconazole. -Rx Fluconazole 150 mg x1 -Reviewed return precautions  Alen Bleacher, MD [place]

## 2022-05-24 NOTE — Patient Instructions (Addendum)
It was wonderful to meet you today. Thank you for allowing me to be a part of your care. Below is a short summary of what we discussed at your visit today:  We have obtained labs to test for bacteria vaginosis, STD and fungal infection.  We will follow-up with you with the results.  Please bring all of your medications to every appointment!  If you have any questions or concerns, please do not hesitate to contact us via phone or MyChart message.   Alen Bleacher, MD [facility]

## 2022-05-24 NOTE — Telephone Encounter (Signed)
Patient calls nurse line requesting to speak with someone regarding results from wet prep. She reports continued vaginal irritation.   She is asking if she will be receiving any medication. She can be reached at [phone] or via mychart.  Talbot Grumbling, RN

## 2022-05-25 LAB — CERVICOVAGINAL ANCILLARY ONLY
Bacterial Vaginitis (gardnerella): NEGATIVE
Candida Glabrata: NEGATIVE
Candida Vaginitis: POSITIVE — AB
Chlamydia: NEGATIVE
Comment: NEGATIVE
Comment: NEGATIVE
Comment: NEGATIVE
Comment: NEGATIVE
Comment: NEGATIVE
Comment: NORMAL
Neisseria Gonorrhea: NEGATIVE
Trichomonas: NEGATIVE

## 2022-05-31 NOTE — Telephone Encounter (Signed)
Called PT to confirm appt on 8/14. Pt confirmed she will be attending appt.    

## 2022-06-28 ENCOUNTER — Ambulatory Visit: Payer: Commercial Managed Care - HMO

## 2022-08-30 ENCOUNTER — Ambulatory Visit (INDEPENDENT_AMBULATORY_CARE_PROVIDER_SITE_OTHER): Payer: Medicaid Other | Admitting: Family Medicine

## 2022-08-30 ENCOUNTER — Encounter: Payer: Self-pay | Admitting: Family Medicine

## 2022-08-30 VITALS — BP 118/76 | HR 76 | Ht 68.0 in | Wt 203.8 lb

## 2022-08-30 DIAGNOSIS — R2231 Localized swelling, mass and lump, right upper limb: Secondary | ICD-10-CM

## 2022-08-30 NOTE — Patient Instructions (Addendum)
It was wonderful to see you today.  Today we talked about:  We would suggest monitoring your hand for now. You can use ice, this may help with the swelling. We are not quite sure what this is but think it may go away on its own.   Please follow up in the Sports Medicine clinic in 2 weeks if not improved. They will be able to repeat an ultrasound. If it is resolved in 2 weeks, no need to follow up [address], [place], [place] [address] [phone]  If you develop worsening swelling, fevers, red streaking, more pain, please come back sooner.    Thank you for coming to your visit as scheduled. We have had a large "no-show" problem lately, and this significantly limits our ability to see and care for patients. As a friendly reminder- if you cannot make your appointment please call to cancel. We do have a no show policy for those who do not cancel within 24 hours. Our policy is that if you miss or fail to cancel an appointment within 24 hours, 3 times in a 32-month period, you may be dismissed from our clinic.   Thank you for choosing [facility].   Please call [phone] with any questions about today's appointment.  Please be sure to schedule follow up at the front  desk before you leave today.   Sharion Settler, DO PGY-3 Family Medicine

## 2022-08-30 NOTE — Progress Notes (Signed)
    SUBJECTIVE:   CHIEF COMPLAINT / HPI:   Sandra Anderson is a 32 y.o. female who presents to the [facility] clinic today to discuss the following concerns:   "Knot on Right Hand" States that she noticed a knot on Friday (2/2). She was cleaning when she noticed it. It has not been enlarging, seems slightly smaller than previous. She is concerned because it is still persistent. She has some discomfort when she makes a tight fist. She has not tried any over the counter medications or heat/ice to it.   PERTINENT  PMH / SFS:ELTR relevant   OBJECTIVE:   BP 118/76   Pulse 76   Ht 5\' 8"  (1.727 m)   Wt 203 lb 12.8 oz (92.4 kg)   SpO2 97%   BMI 30.99 kg/m    General: NAD, pleasant, able to participate in exam. Respiratory: normal effort Right Hand: 2x2 round and firm lesion palpable to right dorsal hand. No surrounding erythema. Normal ROM in flexion and extension of right hand and digits. Neurovascularly intact.  Psych: Normal affect and mood    ASSESSMENT/PLAN:   1. Mass of right hand Unclear etiology. For educational purposes an ultrasound was used to further try to classify the lesion. It is unclear if associated with tendon. Differential includes ganglion cyst, abscess, superficial fibromatoses, synovial chondromatosis (typically seen in larger joints), infectious tenosynovitis (less likely), doubt soft tissue malignancy. - Recommend conservative care; can use ice  - Recommend follow-up in sports med clinic if not resolved in 2 weeks - Provided with strict return precautions if she should develop fevers, worsening swelling, erythematous streaking, worsening pain     Sharion Settler, [place]

## 2022-09-07 ENCOUNTER — Encounter: Payer: Self-pay | Admitting: Sports Medicine

## 2022-09-07 ENCOUNTER — Ambulatory Visit (INDEPENDENT_AMBULATORY_CARE_PROVIDER_SITE_OTHER): Payer: Medicaid Other | Admitting: Sports Medicine

## 2022-09-07 ENCOUNTER — Ambulatory Visit: Payer: Self-pay

## 2022-09-07 VITALS — BP 120/86 | Ht 68.0 in | Wt 208.0 lb

## 2022-09-07 DIAGNOSIS — M79641 Pain in right hand: Secondary | ICD-10-CM | POA: Insufficient documentation

## 2022-09-07 NOTE — Patient Instructions (Signed)
Call our office if you develop any fevers or chills.  Follow-up in 2 to 3 weeks if you have had no improvement more than area becomes larger.

## 2022-09-07 NOTE — Progress Notes (Signed)
   New Patient Office Visit  Subjective   Patient ID: Sandra Anderson, female    DOB: 1991/02/11  Age: 32 y.o. MRN: [id number]  Bump on right hand.  Sandra Anderson is here today with chief complaint of a knot on her right hand that she noticed about 2 weeks ago that has been progressively getting larger.  She denies any injury to the area.  She reports initially was a little tender to touch but that has resolved.  She denies any fevers, chills or redness over the area.  She denies any history of anything like this in the past.  She is right-hand dominant.  No medical issues.   ROS as listed above in HPI    Objective:     BP 120/86   Ht 5\' 8"  (1.727 m)   Wt 208 lb (94.3 kg)   BMI 31.63 kg/m   Physical Exam Vitals reviewed.  Constitutional:      General: She is not in acute distress.    Appearance: Normal appearance. She is not ill-appearing, toxic-appearing or diaphoretic.  Neurological:     Mental Status: She is alert.   Right hand: No obvious deformity.  She does have a well-circumscribed firm, mobile mass on the dorsum of her hand over the second and third metacarpals.  No tenderness to palpation.  No erythema, ecchymosis or redness surrounding the area.  Full range of motion the wrist and hand.  Grip strength 5/5.  Radial pulse 2+ bilaterally  Limited ultrasound: Right hand Subcutaneous anechoic approximately 2 x 2 centimeter cystic lesion with some heterogeneous hyperechoic areas within this cystic like structure.  Small amount of blood flow evidence on Doppler flow on the periphery of the cystic-like structure Impression: Cyst versus hematoma, no obvious concern for infection at this time   Assessment & Plan:   Problem List Items Addressed This Visit       Other   Right hand pain - Primary    Patient has an approximate 2 x 2 centimeter mass on her hand, cyst versus hematoma.  Discussed with her conservative management with ice, compression and follow-up.  We also discussed  aspiration but she opted to hold off at this time.  Recommend follow-up in 2 weeks.  We can aspirate at that time if she has had no improvement.  Discussed with her if she develops any fevers, chills or redness over the area call our office.  She verbalized understanding.      Relevant Orders   [place] LIMITED JOINT SPACE STRUCTURES UP RIGHT    Return in about 2 weeks (around 09/21/2022).    Sandra Guise, DO  Addendum:  Patient seen in the office by fellow.  Her history, exam, plan of care were precepted with me.  Sandra Lemon MD Sandra Anderson

## 2022-09-07 NOTE — Assessment & Plan Note (Signed)
Patient has an approximate 2 x 2 centimeter mass on her hand, cyst versus hematoma.  Discussed with her conservative management with ice, compression and follow-up.  We also discussed aspiration but she opted to hold off at this time.  Recommend follow-up in 2 weeks.  We can aspirate at that time if she has had no improvement.  Discussed with her if she develops any fevers, chills or redness over the area call our office.  She verbalized understanding.

## 2022-10-24 ENCOUNTER — Ambulatory Visit
Admission: RE | Admit: 2022-10-24 | Discharge: 2022-10-24 | Disposition: A | Discharge status: Home / Self Care | Payer: Medicaid Other | Source: Ambulatory Visit | Attending: Family Medicine | Admitting: Family Medicine

## 2022-10-24 ENCOUNTER — Ambulatory Visit (INDEPENDENT_AMBULATORY_CARE_PROVIDER_SITE_OTHER): Payer: Medicaid Other | Admitting: Student

## 2022-10-24 VITALS — BP 122/80 | HR 80 | Wt 205.0 lb

## 2022-10-24 DIAGNOSIS — M62830 Muscle spasm of back: Secondary | ICD-10-CM | POA: Diagnosis not present

## 2022-10-24 DIAGNOSIS — M545 Low back pain, unspecified: Secondary | ICD-10-CM | POA: Diagnosis not present

## 2022-10-24 MED ORDER — CYCLOBENZAPRINE HCL 5 MG PO TABS: 5.0000 mg | ORAL_TABLET | Freq: Two times a day (BID) | ORAL | 0 refills | Status: DC | PRN

## 2022-10-24 MED ORDER — IBUPROFEN 600 MG PO TABS: 600.0000 mg | ORAL_TABLET | Freq: Three times a day (TID) | ORAL | 0 refills | Status: AC | PRN

## 2022-10-24 NOTE — Patient Instructions (Addendum)
It was great to see you today! Thank you for choosing [facility] for your primary care. Sandra Anderson was seen for low back pain.  Today we addressed: I recommend heat packs, Voltaren gel, Tylenol 1000 mg 3 times daily as needed.  I have prescribed you Flexeril to take 1-2 times daily as needed but I would recommend mostly taking it at night as it can have a sleepiness side effect.  We have also prescribed ibuprofen 600 mg 3 times daily as needed.  I would highly recommend addressing the underlying cause that is likely the mattress not having appropriate support.  I have attached low back pain exercises although they will not have the best effect if you are still sleeping on this mattress without support.  I have placed an order for lumbar x-ray.  Please go to [place] at [employer] [facility] or at [facility] to have this completed.  You do not need an appointment, but if you would like to call them beforehand, their number is [phone].  We will contact you with your results afterwards.   If you haven't already, sign up for My Chart to have easy access to your labs results, and communication with your primary care physician.  Call the clinic at [phone] if your symptoms worsen or you have any concerns.  You should return to our clinic Return if symptoms worsen or fail to improve. Please arrive 15 minutes before your appointment to ensure smooth check in process.  We appreciate your efforts in making this happen.  Thank you for allowing me to participate in your care, Wells Guiles, DO 10/24/2022, 10:10 AM PGY-2, [place]

## 2022-10-24 NOTE — Progress Notes (Signed)
  SUBJECTIVE:   CHIEF COMPLAINT / HPI:   Low back pain: started 1 month ago in the middle of her low back Positionally improves if she lays on her stomach. Lying on one side or another will make it worse. She has not seen any doctor about this, has not tried any medications or ice/heat pack. Denies fever, urinary or bowel incontinence. Notes she just got a new king-sized bed, does not have box spring underneath and has not been able to order one yet.  PERTINENT  PMH / PSH: N/A  Patient Care Team: Sharion Settler, DO as PCP - General (Family Medicine) Department, [facility] OBJECTIVE:  BP 122/80   Pulse 80   Wt 205 lb (93 kg)   BMI 31.17 kg/m  Back, lumbar:  - Inspection: no gross deformity or asymmetry, swelling or ecchymosis. No skin changes - Palpation: No TTP over the spinous processes, or SI joints b/l.  Significant pain over bilateral lumbar paraspinal muscles - ROM: full active ROM of the lumbar spine in flexion/extension/sidebending with discomfort - Strength: 5/5 strength of lower extremity in L4-S1 nerve root distributions b/l, gait intact - Neuro: sensation intact in the L4-S1 nerve root distribution b/l, 2+ L4 and S1 reflexes  -Gait: Appropriately intact  ASSESSMENT/PLAN:  Paraspinal muscle spasm Assessment & Plan: Suspect related to new changes with mattress without walk sprain.  Physical exam tenderness only over paraspinals however will obtain lumbar XR at request of patient.  Recommend conservative management and addressing underlying cause of appropriate support for new mattress.  Orders: -     Ibuprofen; Take 1 tablet (600 mg total) by mouth every 8 (eight) hours as needed for up to 14 days.  Dispense: 42 tablet; Refill: 0 -     Cyclobenzaprine HCl; Take 1 tablet (5 mg total) by mouth 2 (two) times daily as needed for muscle spasms.  Dispense: 30 tablet; Refill: 0 -     DG Lumbar Spine Complete; Future   Return if symptoms worsen or fail to  improve. Wells Guiles, DO 10/24/2022, 10:15 AM PGY-2, [place]

## 2022-10-24 NOTE — Assessment & Plan Note (Signed)
Suspect related to new changes with mattress without walk sprain.  Physical exam tenderness only over paraspinals however will obtain lumbar XR at request of patient.  Recommend conservative management and addressing underlying cause of appropriate support for new mattress.

## 2022-11-24 DIAGNOSIS — F315 Bipolar disorder, current episode depressed, severe, with psychotic features: Secondary | ICD-10-CM | POA: Diagnosis not present

## 2022-11-24 DIAGNOSIS — F419 Anxiety disorder, unspecified: Secondary | ICD-10-CM | POA: Diagnosis not present

## 2022-11-25 ENCOUNTER — Telehealth: Payer: Self-pay | Admitting: *Deleted

## 2022-11-25 ENCOUNTER — Other Ambulatory Visit: Payer: Self-pay | Admitting: Family Medicine

## 2022-11-25 DIAGNOSIS — F209 Schizophrenia, unspecified: Secondary | ICD-10-CM

## 2022-11-25 DIAGNOSIS — F319 Bipolar disorder, unspecified: Secondary | ICD-10-CM

## 2022-11-25 NOTE — Telephone Encounter (Signed)
Patient called and would like a referral to [facility].  Will forward to Md.  Please place the information below in the comment box for referral use.  Thanks Jannely Henthorn,CMA   [address]  [place], [place] [address]  Phone: [phone]  Fax: [phone]

## 2022-11-25 NOTE — Telephone Encounter (Signed)
Referral placed.

## 2022-11-28 ENCOUNTER — Encounter: Payer: Self-pay | Admitting: *Deleted

## 2022-12-01 ENCOUNTER — Telehealth: Payer: Self-pay | Admitting: Family Medicine

## 2022-12-01 NOTE — Telephone Encounter (Signed)
Please re-send referral to the appropriate agency, once done I can call patient to let her know. Thank you! Penni Bombard CMA

## 2022-12-01 NOTE — Telephone Encounter (Signed)
Patient walked in stating the agency said the referral was loss and need to be resent.  Need asap.  Patient ph# [phone]

## 2022-12-06 NOTE — Telephone Encounter (Signed)
Called patient but no answer, I have left a VM to the patient letting her known the referral was sent twice yesterday and to give the agency time to call her. If she has other questions or concerns to call the office. Penni Bombard CMA

## 2022-12-26 ENCOUNTER — Ambulatory Visit: Payer: Medicaid Other

## 2022-12-27 ENCOUNTER — Other Ambulatory Visit (HOSPITAL_COMMUNITY)
Admission: RE | Admit: 2022-12-27 | Discharge: 2022-12-27 | Disposition: A | Discharge status: Home / Self Care | Payer: Medicaid Other | Source: Ambulatory Visit | Attending: Family Medicine | Admitting: Family Medicine

## 2022-12-27 ENCOUNTER — Ambulatory Visit (INDEPENDENT_AMBULATORY_CARE_PROVIDER_SITE_OTHER): Payer: Medicaid Other | Admitting: Family Medicine

## 2022-12-27 VITALS — BP 118/76 | HR 75 | Ht 68.0 in | Wt 204.2 lb

## 2022-12-27 DIAGNOSIS — Z113 Encounter for screening for infections with a predominantly sexual mode of transmission: Secondary | ICD-10-CM | POA: Diagnosis not present

## 2022-12-27 DIAGNOSIS — Z1159 Encounter for screening for other viral diseases: Secondary | ICD-10-CM

## 2022-12-27 DIAGNOSIS — B379 Candidiasis, unspecified: Secondary | ICD-10-CM | POA: Diagnosis present

## 2022-12-27 DIAGNOSIS — N898 Other specified noninflammatory disorders of vagina: Secondary | ICD-10-CM

## 2022-12-27 LAB — POCT WET PREP (WET MOUNT)
Clue Cells Wet Prep Whiff POC: NEGATIVE
Trichomonas Wet Prep HPF POC: ABSENT
WBC, Wet Prep HPF POC: >20

## 2022-12-27 MED ORDER — FLUCONAZOLE 150 MG PO TABS: 150.0000 mg | ORAL_TABLET | Freq: Once | ORAL | 0 refills | Status: AC

## 2022-12-27 NOTE — Patient Instructions (Signed)
It was great seeing you today!  You came for vaginal irritation and we also checked for STIs. Your results did show a yeast infection so I have sent in a pill to take once. If you still have symptoms please give me a call and I can send a second dose.  I will call you if anything is abnormal on your blood work or STI tests.   We will see you at the end of August or after for your pap smear.   Feel free to call with any questions or concerns at any time, at [phone].   Take care,  Dr. Cora Collum [facility]

## 2022-12-27 NOTE — Progress Notes (Signed)
    SUBJECTIVE:   CHIEF COMPLAINT / HPI:   Patient is a 32 y.o. female presenting with vaginal itching and irritation for about 3 days. Denies abnormal discharge. Is sexually active with new partner. She is interested in screening for sexually transmitted infections today and blood work. Using condoms for contraception.   Last pap in August 2021 normal. Not due until August 2026  OBJECTIVE:   There were no vitals taken for this visit.   General: NAD, pleasant, able to participate in exam Respiratory: Normal effort, no obvious respiratory distress Pelvic: VULVA: normal appearing vulva with no masses, tenderness or lesions, VAGINA: Normal appearing vagina with normal color, no lesions, with white discharge present, CERVIX: No lesions, white discharge present,  Chaperone [name]  present for pelvic exam  ASSESSMENT/PLAN:   No problem-specific Assessment & Plan notes found for this encounter.   Assessment:  32 y.o. female with vaginal irritation and itching for the past 3 days.  Physical exam significant for clear/white discharge. Using condoms for contraception, declines other methods at this time. Pap due in August. Wet prep performed today shows yeast infection. Patient is interested in STI screening.   Plan: -Wet prep as above.  Will treat with Diflucan x1. -GC/chlamydia pending -Will check HIV and RPR  Health maintenance Hep C screen ordered   Cora Collum, DO [facility]

## 2022-12-28 LAB — HCV INTERPRETATION

## 2022-12-28 LAB — RPR: RPR Ser Ql: NONREACTIVE

## 2022-12-28 LAB — CERVICOVAGINAL ANCILLARY ONLY
Chlamydia: NEGATIVE
Comment: NEGATIVE
Comment: NORMAL
Neisseria Gonorrhea: NEGATIVE

## 2022-12-28 LAB — HCV AB W REFLEX TO QUANT PCR: HCV Ab: NONREACTIVE

## 2022-12-28 LAB — HIV ANTIBODY (ROUTINE TESTING W REFLEX): HIV Screen 4th Generation wRfx: NONREACTIVE

## 2023-04-12 ENCOUNTER — Other Ambulatory Visit (HOSPITAL_COMMUNITY)
Admission: RE | Admit: 2023-04-12 | Discharge: 2023-04-12 | Disposition: A | Discharge status: Home / Self Care | Payer: Medicaid Other | Source: Ambulatory Visit | Attending: Family Medicine | Admitting: Family Medicine

## 2023-04-12 ENCOUNTER — Ambulatory Visit (INDEPENDENT_AMBULATORY_CARE_PROVIDER_SITE_OTHER): Payer: Medicaid Other | Admitting: Student

## 2023-04-12 VITALS — BP 140/90 | HR 75 | Ht 68.0 in | Wt 201.0 lb

## 2023-04-12 DIAGNOSIS — Z113 Encounter for screening for infections with a predominantly sexual mode of transmission: Secondary | ICD-10-CM | POA: Insufficient documentation

## 2023-04-12 DIAGNOSIS — N898 Other specified noninflammatory disorders of vagina: Secondary | ICD-10-CM | POA: Diagnosis present

## 2023-04-12 DIAGNOSIS — Z3009 Encounter for other general counseling and advice on contraception: Secondary | ICD-10-CM | POA: Diagnosis not present

## 2023-04-12 LAB — POCT WET PREP (WET MOUNT)
Clue Cells Wet Prep Whiff POC: NEGATIVE
Trichomonas Wet Prep HPF POC: ABSENT
WBC, Wet Prep HPF POC: >20

## 2023-04-12 MED ORDER — FLUCONAZOLE 150 MG PO TABS: 150.0000 mg | ORAL_TABLET | Freq: Once | ORAL | 0 refills | Status: AC

## 2023-04-12 NOTE — Progress Notes (Signed)
    SUBJECTIVE:   CHIEF COMPLAINT / HPI:   Vaginal Discharge: Patient is a 32 y.o. female presenting with vaginal itching for 1 day.    She denies vaginal odor, change in discharge, or dysuria.  She is interested in screening for sexually transmitted infections today.  -LMP earlier this month -Sometimes uses condoms for contraception.  She used to use Depo-Provera but does not like that she gained weight as a side effect, so she is currently not using anything for contraception.  She does not desire pregnancy. -Sexually active with men  PERTINENT  PMH / PSH: History of ectopic pregnancy, history of ASCUS of cervix with negative high risk HPV, history of trichomonas and BV  OBJECTIVE:   BP (!) 140/90   Pulse 75   Ht 5\' 8"  (1.727 m)   Wt 201 lb (91.2 kg)   LMP 04/04/2023   SpO2 98%   BMI 30.56 kg/m    General: NAD, pleasant, able to participate in exam Respiratory: Normal effort, no obvious respiratory distress Pelvic: VULVA: normal appearing vulva with no masses, tenderness or lesions, VAGINA: Normal appearing vagina with normal color, no lesions, with copious thick, white discharge present, CERVIX: No lesions  Chaperone Jone Baseman, CMA present for pelvic exam  ASSESSMENT/PLAN:   Encounter for counseling regarding contraception Extensively discussed contraceptive options, and the importance of barrier methods to prevent undesired pregnancy and sexually transmitted infection. Patient is most interested in IUD, which I provided a handout with information. She will think this over and if she would like to proceed with an IUD she will let us know. Provided with condoms.    Assessment:  32 y.o. female with vaginal itching for 1 day. Physical exam significant for thick, white discharge.  Wet prep performed today shows many yeast consistent with vaginal candidiasis.  Patient is interested in STI screening.   Plan: -Wet prep as above.  Will treat with oral Diflucan 150 mg x  1. -GC/chlamydia pending -Will check HIV and RPR  Darral Dash, DO [facility] Center

## 2023-04-12 NOTE — Patient Instructions (Addendum)
So great meeting you today.  You have a yeast infection.  Will treat this with a pill called Diflucan that you take 1 time.  I sent this to your pharmacy.   We collected testing today, if anything is abnormal I will call you.  You will also see this on your MyChart.  We discussed birth control options, mostly the intrauterine device (IUD).  This is 99.9% effective at preventing pregnancy.  IUD lasts for 5 to 8 years, and can be removed at any time should you want to try to have another child.  Think this over and let us know if you would like to get an IUD.  If so, call and schedule an appointment and please use protection for 2 weeks prior to insertion, or abstain from intercourse completely.  This is to make sure that we do not insert the IUD while you are pregnant.  Advantages of a hormone IUD If it is inserted within 7 days of your period starting, it works right after it has been inserted. If the hormone IUD is inserted at any other time in your cycle, you will need to use a backup method of birth control for 7 days after insertion. It can make menstrual periods lighter or stop completely. It can reduce menstrual cramping and other discomforts from menstrual periods. It can be used for 3-5 years, depending on which IUD you have.  Dr. Melissa Noon

## 2023-04-12 NOTE — Assessment & Plan Note (Signed)
Extensively discussed contraceptive options, and the importance of barrier methods to prevent undesired pregnancy and sexually transmitted infection. Patient is most interested in IUD, which I provided a handout with information. She will think this over and if she would like to proceed with an IUD she will let us know. Provided with condoms.

## 2023-04-14 LAB — CERVICOVAGINAL ANCILLARY ONLY
Chlamydia: NEGATIVE
Comment: NEGATIVE
Comment: NEGATIVE
Comment: NORMAL
Neisseria Gonorrhea: NEGATIVE
Trichomonas: NEGATIVE

## 2023-05-02 ENCOUNTER — Ambulatory Visit: Payer: Medicaid Other | Admitting: Family Medicine

## 2023-06-09 ENCOUNTER — Ambulatory Visit: Payer: Medicaid Other

## 2023-06-09 NOTE — Progress Notes (Deleted)
    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 This was present on the labia last time.   PERTINENT  PMH / PSH:  Reviewed   OBJECTIVE:   There were no vitals taken for this visit.  General: Alert and oriented in no apparent distress Heart: Regular rate and rhythm with no murmurs appreciated Lungs: CTA bilaterally, no wheezing Abdomen: Bowel sounds present, no abdominal pain Skin: Warm and dry Extremities: No lower extremity edema   ASSESSMENT/PLAN:   No problem-specific Assessment & Plan notes found for this encounter.   Differential includes hemorrhoids, anal fissure, perianal abscess, malignancy, hidradenitis suppurativa, anal warts.   Alfredo Martinez, MD [facility]

## 2023-07-04 ENCOUNTER — Ambulatory Visit (INDEPENDENT_AMBULATORY_CARE_PROVIDER_SITE_OTHER): Payer: Medicaid Other | Admitting: Student

## 2023-07-04 ENCOUNTER — Encounter: Payer: Self-pay | Admitting: Student

## 2023-07-04 ENCOUNTER — Other Ambulatory Visit (HOSPITAL_COMMUNITY)
Admission: RE | Admit: 2023-07-04 | Discharge: 2023-07-04 | Disposition: A | Discharge status: Home / Self Care | Payer: Medicaid Other | Source: Ambulatory Visit | Attending: Family Medicine | Admitting: Family Medicine

## 2023-07-04 VITALS — BP 122/80 | HR 57 | Ht 68.0 in | Wt 192.5 lb

## 2023-07-04 DIAGNOSIS — Z124 Encounter for screening for malignant neoplasm of cervix: Secondary | ICD-10-CM

## 2023-07-04 DIAGNOSIS — N898 Other specified noninflammatory disorders of vagina: Secondary | ICD-10-CM | POA: Diagnosis present

## 2023-07-04 LAB — POCT WET PREP (WET MOUNT)
Clue Cells Wet Prep Whiff POC: POSITIVE
Trichomonas Wet Prep HPF POC: ABSENT

## 2023-07-04 MED ORDER — FLUCONAZOLE 150 MG PO TABS
150.0000 mg | ORAL_TABLET | Freq: Once | ORAL | 0 refills | Status: AC
Start: 2023-07-04 — End: 2023-07-04

## 2023-07-04 MED ORDER — FLUCONAZOLE 150 MG PO TABS
150.0000 mg | ORAL_TABLET | Freq: Once | ORAL | 0 refills | Status: DC
Start: 2023-07-04 — End: 2023-07-04

## 2023-07-04 MED ORDER — METRONIDAZOLE 500 MG PO TABS
500.0000 mg | ORAL_TABLET | Freq: Two times a day (BID) | ORAL | 0 refills | Status: AC
Start: 2023-07-04 — End: 2023-07-11

## 2023-07-04 NOTE — Patient Instructions (Signed)
It was great to see you! Thank you for allowing me to participate in your care!   Our plans for today:  - We got your pap smear and some vaginal swabs today, I will let you know what this shows! - Let [place] know if interested in birth control in the future. Use barrier method in meantime for STD prevention  Take care and seek immediate care sooner if you develop any concerns.  Levin Erp, MD

## 2023-07-04 NOTE — Progress Notes (Signed)
    SUBJECTIVE:   CHIEF COMPLAINT / HPI:   Vaginal Odor: Patient is a 32 y.o. female presenting with vaginal odor for 5 days after period.  She states no discharge.  She is interested in screening for sexually transmitted infections today. She is not on birth control and denies wanting to try any.  PERTINENT  PMH / PSH: None relevant  OBJECTIVE:   BP 122/80   Pulse (!) 57   Ht 5\' 8"  (1.727 m)   Wt 192 lb 8 oz (87.3 kg)   LMP 06/29/2023   SpO2 97%   BMI 29.27 kg/m    General: NAD, pleasant, able to participate in exam Respiratory: Normal effort, no obvious respiratory distress Pelvic: VULVA: normal appearing vulva with no masses, tenderness or lesions, VAGINA: Normal appearing vagina with normal color, no lesions, with white discharge present, CERVIX: No lesions, white discharge present  Chaperone Gillermina Phy CMA present for pelvic exam  ASSESSMENT/PLAN:   Assessment & Plan Vaginal odor Physical exam significant for white discharge.  Wet prep performed today shows yeast and BV infection.  Patient is interested in STI screening.  Attempted to call patient x 3 times with no answer.  Left voicemail to check MyChart/call clinic for more details.  Left MyChart message and sent medications in for patient. Plan: -Wet prep as above.  Will treat with Flagyl course for 7 days followed by Diflucan. -PAP obtained with G/C -Declines HIV/RPR testing -Discussed protection during intercourse and contraceptive methods -Follow-up as needed   Levin Erp, MD [facility]

## 2023-07-05 LAB — CYTOLOGY - PAP
Adequacy: ABSENT
Chlamydia: NEGATIVE
Comment: NEGATIVE
Comment: NEGATIVE
Comment: NORMAL
Diagnosis: NEGATIVE
High risk HPV: NEGATIVE
Neisseria Gonorrhea: NEGATIVE

## 2023-09-29 ENCOUNTER — Other Ambulatory Visit (HOSPITAL_COMMUNITY)
Admission: RE | Admit: 2023-09-29 | Discharge: 2023-09-29 | Disposition: A | Discharge status: Home / Self Care | Source: Ambulatory Visit | Attending: Family Medicine | Admitting: Family Medicine

## 2023-09-29 ENCOUNTER — Ambulatory Visit: Admitting: Family Medicine

## 2023-09-29 ENCOUNTER — Encounter: Payer: Self-pay | Admitting: Family Medicine

## 2023-09-29 VITALS — BP 133/89 | HR 73 | Ht 67.0 in | Wt 195.8 lb

## 2023-09-29 DIAGNOSIS — N898 Other specified noninflammatory disorders of vagina: Secondary | ICD-10-CM | POA: Diagnosis present

## 2023-09-29 DIAGNOSIS — R030 Elevated blood-pressure reading, without diagnosis of hypertension: Secondary | ICD-10-CM

## 2023-09-29 LAB — POCT WET PREP (WET MOUNT)
Clue Cells Wet Prep Whiff POC: NEGATIVE
Trichomonas Wet Prep HPF POC: ABSENT

## 2023-09-29 MED ORDER — MICONAZOLE NITRATE 200 MG VA SUPP: 200.0000 mg | Freq: Every day | VAGINAL | 0 refills | Status: AC

## 2023-09-29 NOTE — Patient Instructions (Addendum)
 Thank you for visiting clinic today and allowing [place] to participate in your care!  We discussed the symptoms you've been having and collected some samples for testing. I will contact you with the results and next steps.   Condoms can help prevent pregnancy and sexually transmitted infections. Please schedule an appointment with our clinic if you ever want to explore birth control options further.   Your blood pressure is a little bit elevated. Please try to cut back on the salt in your diet. Eating healthy and staying active are great to do as well!  Please schedule an appointment in 1-2 months to follow up on your blood pressure.   Reach out any time with any questions or concerns you may have - we are here for you!  Sandra Quale, MD [facility] [phone]

## 2023-09-29 NOTE — Progress Notes (Signed)
    SUBJECTIVE:   CHIEF COMPLAINT / HPI:   Vaginal irritation  Reports vaginal irritation, itching, burning for past 2 days. No new odor, no discharge changes. No bumps or blistering. Sexually active with one female partner. No condom use. No other BC, and is not interested at this time given prior experience with weight gain while on Depo. Not actively planning for pregnancy. LMP some time towards beginning of last month. No recent fever or cold symptoms.   Not interested in HIV/RPR testing today   Not planning for pregnancy at this time. Declines pregnancy test today   No fever, cough/cold symptoms   PERTINENT  PMH / PSH: Hx of BV, yeast infection  OBJECTIVE:   BP 133/89   Pulse 73   Ht 5\' 7"  (1.702 m)   Wt 195 lb 12.8 oz (88.8 kg)   SpO2 99%   BMI 30.67 kg/m   General: Well-appearing. Resting comfortably in room. CV: Normal S1/S2. No extra heart sounds. Warm and well-perfused. Pulm: Breathing comfortably on room air. CTAB. No increased WOB. GU: Thick, white discharge present. Normal appearing cervix.  GU exam assisted and chaperoned by CMA.   ASSESSMENT/PLAN:   Assessment & Plan Vaginal itching Wet prep today suggestive of yeast infection. G/C collected. HIV/RPR declined by patient. Urine preg declined by patient.  - Called patient to discuss wet prep findings that resulted after her clinic appt. Sent for Miconazole 200 suppository x 3 days. Patient expressed understanding and questions were addressed.  - Fu G/C - Discussed BC options. Patient declines today and may consider options in the future. Discussed condom use to prevent undesired pregnancy and spread of STI/STD.  Elevated BP without diagnosis of hypertension Elevated BP to 134/98 on arrival. Repeat 133/89. A - Discussed reducing sodium in diet, practicing healthy diet and activity  - CTM    RTC in 1-2 months for BP follow up.   Ivery Quale, MD [facility]

## 2023-10-02 ENCOUNTER — Encounter: Payer: Self-pay | Admitting: Family Medicine

## 2023-10-02 LAB — CERVICOVAGINAL ANCILLARY ONLY
Chlamydia: NEGATIVE
Comment: NEGATIVE
Comment: NEGATIVE
Comment: NORMAL
Neisseria Gonorrhea: NEGATIVE
Trichomonas: NEGATIVE

## 2023-10-03 ENCOUNTER — Ambulatory Visit

## 2024-01-02 ENCOUNTER — Encounter: Payer: Self-pay | Admitting: *Deleted

## 2024-01-29 ENCOUNTER — Other Ambulatory Visit (HOSPITAL_COMMUNITY)
Admission: RE | Admit: 2024-01-29 | Discharge: 2024-01-29 | Disposition: A | Discharge status: Home / Self Care | Source: Ambulatory Visit | Attending: Family Medicine | Admitting: Family Medicine

## 2024-01-29 ENCOUNTER — Telehealth: Payer: Self-pay

## 2024-01-29 ENCOUNTER — Ambulatory Visit: Admitting: Student

## 2024-01-29 VITALS — BP 130/80 | HR 80 | Ht 67.0 in | Wt 208.2 lb

## 2024-01-29 DIAGNOSIS — N898 Other specified noninflammatory disorders of vagina: Secondary | ICD-10-CM | POA: Diagnosis present

## 2024-01-29 LAB — POCT WET PREP (WET MOUNT)
Clue Cells Wet Prep Whiff POC: POSITIVE
Trichomonas Wet Prep HPF POC: ABSENT

## 2024-01-29 MED ORDER — METRONIDAZOLE 500 MG PO TABS
500.0000 mg | ORAL_TABLET | Freq: Two times a day (BID) | ORAL | 0 refills | Status: AC
Start: 2024-01-29 — End: 2024-02-05

## 2024-01-29 NOTE — Progress Notes (Signed)
    SUBJECTIVE:   CHIEF COMPLAINT / HPI:   Sandra Anderson is a 33 y.o. female  presenting for vaginal irritation.   Vaginal discharge/STI check Contraception: condoms  Symptoms include: Abnormal vaginal discharge, Foul odor, and Rash Monogamous with 1 partner uses condoms regularly.  Recently went to a water park wearing jeans shorts now has irritation and a rash/Burning in her upper thighs.  PERTINENT  PMH / PSH: Reviewed and updated   OBJECTIVE:   BP 130/80   Pulse 80   Ht 5' 7 (1.702 m)   Wt 208 lb 3.2 oz (94.4 kg)   SpO2 100%   BMI 32.61 kg/m   Well-appearing, no acute distress Cardio: Regular rate, regular rhythm, no murmurs on exam. Pulm: Clear, no wheezing, no crackles. No increased work of breathing Abdominal: bowel sounds present, soft, non-tender, non-distended Extremities: no peripheral edema   Pelvic Exam: MA chaperone present  Normal external genitalia, excoriations on bilateral upper thighs consistent with chafing.  No vesicles noted Discharge: Thick, white No cervical motion tenderness  Cervix visualized with no lesions       01/29/2024    1:51 PM 09/29/2023    1:54 PM 04/12/2023    8:44 AM  PHQ9 SCORE ONLY  PHQ-9 Total Score 4 0 2      ASSESSMENT/PLAN:   Assessment & Plan Vaginal irritation Will treat BV with metronidazole  500 mg BID for the next 7 days.  Will follow GC lab work    STI Screening:  Wet prep preformed few clue cells, negative trichomonas, negative yeast.  GC testing collected.  Birthcontrol and safe sex practices discussed with patient.   Damien Pinal, DO [facility]

## 2024-01-29 NOTE — Patient Instructions (Signed)
 It was great to see you today!   I will send you your results through MyChart.   Future Appointments  Date Time Provider Department Center  01/29/2024  2:30 PM ACCESS TO CARE POOL [facility]    Please arrive 15 minutes before your appointment to ensure smooth check in process.    Please call the clinic at [phone] if your symptoms worsen or you have any concerns.  Thank you for allowing me to participate in your care, Dr. Damien Pinal [facility]

## 2024-01-29 NOTE — Telephone Encounter (Signed)
 Patient calls nurse line regarding message received via mychart.   Verified patient's name and DOB. Advised patient of wet prep results and that provider had sent in prescription.   Patient reports that prescription was supposed to go to [facility] in [place], however, it was sent to [facility].   Called [facility] and had prescription transferred to [facility] in [place].   Chiquita JAYSON English, RN

## 2024-01-30 LAB — CERVICOVAGINAL ANCILLARY ONLY
Chlamydia: NEGATIVE
Comment: NEGATIVE
Comment: NORMAL
Neisseria Gonorrhea: NEGATIVE

## 2024-01-31 ENCOUNTER — Ambulatory Visit: Payer: Self-pay | Admitting: Student

## 2024-02-16 ENCOUNTER — Ambulatory Visit: Admitting: Family Medicine

## 2024-02-16 ENCOUNTER — Other Ambulatory Visit (HOSPITAL_COMMUNITY)
Admission: RE | Admit: 2024-02-16 | Discharge: 2024-02-16 | Disposition: A | Discharge status: Home / Self Care | Source: Ambulatory Visit | Attending: Family Medicine | Admitting: Family Medicine

## 2024-02-16 VITALS — BP 118/76 | HR 69 | Ht 67.0 in | Wt 208.0 lb

## 2024-02-16 DIAGNOSIS — Z113 Encounter for screening for infections with a predominantly sexual mode of transmission: Secondary | ICD-10-CM | POA: Insufficient documentation

## 2024-02-16 NOTE — Progress Notes (Deleted)
    SUBJECTIVE:   CHIEF COMPLAINT / HPI:   STI testing-  Contraception?  PERTINENT  PMH / PSH: ***  OBJECTIVE:   There were no vitals taken for this visit.  ***  ASSESSMENT/PLAN:   Assessment & Plan      Rollene FORBES Keeling, MD [facility]

## 2024-02-16 NOTE — Progress Notes (Signed)
    SUBJECTIVE:   CHIEF COMPLAINT / HPI:   STI testing 2 partners, unprotected intercourse. Had BV previously (3 weeks ago) but did not complete course of treatment as she started to feel better. Now with itching. Denies pain, discharge, odor. FDLMP 7/15. Has been on Depo in the past but didn't keep up with it lately - wants to talk to PCP about restarting.  PERTINENT  PMH / PSH: Reviewed.  OBJECTIVE:   BP 118/76   Pulse 69   Ht 5' 7 (1.702 m)   Wt 208 lb (94.3 kg)   SpO2 97%   BMI 32.58 kg/m   General: well-appearing, no acute distress. HEENT: normocephalic, PERRLA, MMM. Pulm: No increased work of breathing. Abdominal: bowel sounds present, soft, non-tender, non-distended. Extremities: no peripheral edema. Moves all extremities equally.  GU Exam:  External exam: Normal-appearing female external genitalia.   Vaginal exam notable for normal vaginal ruggae, moderate thick white discharge in vaginal vault.  Cervix without obvious lesion. No pain with examination. Chaperoned exam, CMA Ginny.    ASSESSMENT/PLAN:   Assessment & Plan Screening examination for STI Recent unprotected sex and non-completion of abx course for BV. Given symptoms suspect BV recurrence. - STI screening - pt declined HIV, RPR today - scheduled for f/u with PCP to restart Depo per pt request    Lauraine Norse, DO [facility]

## 2024-02-16 NOTE — Patient Instructions (Signed)
 It was so good to see you today! Thank you for allowing me to take care of you.  Today we discussed the following concerns and plans:  STI screening - I will let you know the results and send in any prescriptions if needed.  If you have any concerns, please call the clinic or schedule an appointment.  It was a pleasure to take care of you today. Be well!  Lauraine Norse, DO [facility], PGY-2  Do you need your medications delivered to your home?   We'll send your prescription to the [facility] for delivery.          Address: [address] [place], [place], [place] [address]          Phone: [phone]  Please call the [facility] to speak with a pharmacist and set up your home medication delivery. If you have any questions, feel free to contact us  -- we're happy to help!  Other [facility] that offer affordable prices on both prescriptions and over-the-counter items, as well as convenient services like vaccinations, are  [facility], at [facility]         Address:  [address], [place], [place] [address]         Phone: [phone]  [facility], located in the [facility]        Address: [address], [place], [place] [address]        Phone: [phone]  [facility], at [facility]       Address: [address], [place], [place] [address]       Phone: [phone]  [facility], at [facility]       Address: [address], [address] Floor, [place], [place] [address]       Phone: [phone]

## 2024-02-19 ENCOUNTER — Other Ambulatory Visit: Payer: Self-pay | Admitting: Family Medicine

## 2024-02-19 ENCOUNTER — Ambulatory Visit: Payer: Self-pay | Admitting: Family Medicine

## 2024-02-19 LAB — CERVICOVAGINAL ANCILLARY ONLY
Bacterial Vaginitis (gardnerella): POSITIVE — AB
Candida Glabrata: NEGATIVE
Candida Vaginitis: POSITIVE — AB
Chlamydia: NEGATIVE
Comment: NEGATIVE
Comment: NEGATIVE
Comment: NEGATIVE
Comment: NEGATIVE
Comment: NEGATIVE
Comment: NORMAL
Neisseria Gonorrhea: NEGATIVE
Trichomonas: NEGATIVE

## 2024-02-19 MED ORDER — METRONIDAZOLE 500 MG PO TABS: 500.0000 mg | ORAL_TABLET | Freq: Two times a day (BID) | ORAL | 0 refills | Status: AC

## 2024-02-19 MED ORDER — FLUCONAZOLE 150 MG PO TABS: 150.0000 mg | ORAL_TABLET | Freq: Once | ORAL | 0 refills | Status: AC

## 2024-02-19 NOTE — Telephone Encounter (Signed)
 Called pt, confirmed DOB. Discussed results and answered all questions. Rx for BV and yeast sent to pharmacy - pt instructed how to take.

## 2024-03-08 ENCOUNTER — Ambulatory Visit: Payer: Self-pay | Admitting: Family Medicine

## 2024-03-08 NOTE — Progress Notes (Deleted)
    SUBJECTIVE:   CHIEF COMPLAINT / HPI:   Interested in depo ***    *** A health care provider can be reasonably certain that a patient is not pregnant if the patient has no symptoms or signs of pregnancy and meets any one of the following criteria:  Is <=7 days after the start of normal menses. Has not had sexual intercourse since the start of last normal menses. Has been correctly and consistently using a reliable method of contraception. Is <=7 days after spontaneous or induced abortion. Is within 4 weeks postpartum. Is fully or nearly fully breastfeeding (exclusively breastfeeding or the vast majority [>=85%] of feeds are breastfeeds), amenorrheic, and <6 months postpartum. ***  PERTINENT  PMH / PSH: ***  OBJECTIVE:   There were no vitals taken for this visit.  ***  ASSESSMENT/PLAN:   Assessment & Plan    Payton Coward, MD [facility]

## 2024-04-07 ENCOUNTER — Other Ambulatory Visit: Payer: Self-pay

## 2024-04-07 ENCOUNTER — Emergency Department

## 2024-04-07 ENCOUNTER — Encounter: Payer: Self-pay | Admitting: Emergency Medicine

## 2024-04-07 ENCOUNTER — Emergency Department
Admission: EM | Admit: 2024-04-07 | Discharge: 2024-04-07 | Disposition: A | Discharge status: Home / Self Care | Attending: Emergency Medicine | Admitting: Emergency Medicine

## 2024-04-07 DIAGNOSIS — J3489 Other specified disorders of nose and nasal sinuses: Secondary | ICD-10-CM | POA: Diagnosis not present

## 2024-04-07 DIAGNOSIS — S80212A Abrasion, left knee, initial encounter: Secondary | ICD-10-CM | POA: Diagnosis not present

## 2024-04-07 DIAGNOSIS — M545 Low back pain, unspecified: Secondary | ICD-10-CM | POA: Diagnosis not present

## 2024-04-07 DIAGNOSIS — Z041 Encounter for examination and observation following transport accident: Secondary | ICD-10-CM | POA: Diagnosis not present

## 2024-04-07 DIAGNOSIS — J45909 Unspecified asthma, uncomplicated: Secondary | ICD-10-CM | POA: Insufficient documentation

## 2024-04-07 DIAGNOSIS — S60811A Abrasion of right wrist, initial encounter: Secondary | ICD-10-CM | POA: Insufficient documentation

## 2024-04-07 DIAGNOSIS — M25562 Pain in left knee: Secondary | ICD-10-CM | POA: Diagnosis not present

## 2024-04-07 DIAGNOSIS — Y92410 Unspecified street and highway as the place of occurrence of the external cause: Secondary | ICD-10-CM | POA: Insufficient documentation

## 2024-04-07 DIAGNOSIS — M25561 Pain in right knee: Secondary | ICD-10-CM | POA: Diagnosis not present

## 2024-04-07 DIAGNOSIS — S8992XA Unspecified injury of left lower leg, initial encounter: Secondary | ICD-10-CM | POA: Diagnosis present

## 2024-04-07 LAB — RESP PANEL BY RT-PCR (RSV, FLU A&B, COVID)  RVPGX2
Influenza A by PCR: NEGATIVE
Influenza B by PCR: NEGATIVE
Resp Syncytial Virus by PCR: NEGATIVE
SARS Coronavirus 2 by RT PCR: NEGATIVE

## 2024-04-07 LAB — POC URINE PREG, ED: Preg Test, Ur: NEGATIVE

## 2024-04-07 LAB — GROUP A STREP BY PCR: Group A Strep by PCR: NOT DETECTED

## 2024-04-07 MED ORDER — IBUPROFEN 600 MG PO TABS
600.0000 mg | ORAL_TABLET | Freq: Once | ORAL | Status: AC
  Administered 2024-04-07: 600 mg via ORAL
  Filled 2024-04-07: qty 1

## 2024-04-07 MED ORDER — METHOCARBAMOL 500 MG PO TABS: 500.0000 mg | ORAL_TABLET | Freq: Three times a day (TID) | ORAL | 0 refills | Status: AC | PRN

## 2024-04-07 MED ORDER — MELOXICAM 15 MG PO TABS: 15.0000 mg | ORAL_TABLET | Freq: Every day | ORAL | 0 refills | Status: AC

## 2024-04-07 MED ORDER — FLUTICASONE PROPIONATE 50 MCG/ACT NA SUSP: 1.0000 | Freq: Every day | NASAL | 0 refills | Status: AC

## 2024-04-07 MED ORDER — METHOCARBAMOL 500 MG PO TABS
500.0000 mg | ORAL_TABLET | Freq: Once | ORAL | Status: AC
  Administered 2024-04-07: 500 mg via ORAL
  Filled 2024-04-07: qty 1
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
## 2024-04-08 NOTE — ED Notes (Signed)
 PT in no acute distress prior to discharge. Discharged instructions reviewed and pt stated that they understand directions. Pt has all belongings with them at time of discharge.

## 2024-04-19 ENCOUNTER — Ambulatory Visit: Admitting: Family Medicine

## 2024-04-22 ENCOUNTER — Encounter: Payer: Self-pay | Admitting: Family Medicine

## 2024-04-22 ENCOUNTER — Other Ambulatory Visit (HOSPITAL_COMMUNITY)
Admission: RE | Admit: 2024-04-22 | Discharge: 2024-04-22 | Disposition: A | Discharge status: Home / Self Care | Source: Ambulatory Visit | Attending: Family Medicine | Admitting: Family Medicine

## 2024-04-22 ENCOUNTER — Ambulatory Visit (INDEPENDENT_AMBULATORY_CARE_PROVIDER_SITE_OTHER): Admitting: Family Medicine

## 2024-04-22 VITALS — BP 139/99 | HR 66 | Ht 67.0 in | Wt 206.8 lb

## 2024-04-22 DIAGNOSIS — R030 Elevated blood-pressure reading, without diagnosis of hypertension: Secondary | ICD-10-CM

## 2024-04-22 DIAGNOSIS — Z3200 Encounter for pregnancy test, result unknown: Secondary | ICD-10-CM

## 2024-04-22 DIAGNOSIS — Z113 Encounter for screening for infections with a predominantly sexual mode of transmission: Secondary | ICD-10-CM | POA: Insufficient documentation

## 2024-04-22 LAB — POCT URINE PREGNANCY: Preg Test, Ur: NEGATIVE
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
## 2024-04-23 ENCOUNTER — Ambulatory Visit: Payer: Self-pay | Admitting: Family Medicine

## 2024-04-23 LAB — CERVICOVAGINAL ANCILLARY ONLY
Chlamydia: NEGATIVE
Comment: NEGATIVE
Comment: NEGATIVE
Comment: NORMAL
Neisseria Gonorrhea: NEGATIVE
Trichomonas: NEGATIVE

## 2024-05-07 ENCOUNTER — Ambulatory Visit (INDEPENDENT_AMBULATORY_CARE_PROVIDER_SITE_OTHER): Admitting: Family Medicine

## 2024-05-07 VITALS — BP 131/112 | HR 71 | Ht 67.0 in | Wt 205.0 lb

## 2024-05-07 DIAGNOSIS — R030 Elevated blood-pressure reading, without diagnosis of hypertension: Secondary | ICD-10-CM | POA: Diagnosis not present

## 2024-05-07 DIAGNOSIS — Z3009 Encounter for other general counseling and advice on contraception: Secondary | ICD-10-CM | POA: Diagnosis not present

## 2024-05-07 DIAGNOSIS — N760 Acute vaginitis: Secondary | ICD-10-CM | POA: Diagnosis not present

## 2024-05-07 DIAGNOSIS — N898 Other specified noninflammatory disorders of vagina: Secondary | ICD-10-CM

## 2024-05-07 DIAGNOSIS — B379 Candidiasis, unspecified: Secondary | ICD-10-CM

## 2024-05-07 DIAGNOSIS — B9689 Other specified bacterial agents as the cause of diseases classified elsewhere: Secondary | ICD-10-CM | POA: Diagnosis not present

## 2024-05-07 LAB — POCT WET PREP (WET MOUNT)
Clue Cells Wet Prep Whiff POC: POSITIVE
Trichomonas Wet Prep HPF POC: ABSENT

## 2024-05-07 MED ORDER — FLUCONAZOLE 150 MG PO TABS: 150.0000 mg | ORAL_TABLET | Freq: Once | ORAL | 0 refills | Status: AC

## 2024-05-07 MED ORDER — METRONIDAZOLE 500 MG PO TABS: 500.0000 mg | ORAL_TABLET | Freq: Two times a day (BID) | ORAL | 0 refills | Status: AC

## 2024-05-07 MED ORDER — BLOOD PRESSURE CUFF MISC: 0 refills | Status: AC

## 2024-05-07 NOTE — Patient Instructions (Addendum)
 Thank you for coming in today! Here is a summary of what we discussed:  Blood pressure: please check your blood pressure once a day and keep a log for your next appointment.  You will take Flagyl  7 days twice a day and then take fluconazole  1 tablet. You can take another fluconazole  3 days later if you are still having symptoms.  For more information on contraception/birth control options, please visit the CDC contraception website. Please always use condoms to prevent unwanted pregnancy and sexually transmitted infections. Contraception and Birth Control Methods  Contraception  CDC    Please schedule a follow up appt with your PCP (Dr Romelle) in 1-2 weeks to discuss your blood pressure and birth control options.  Please call the clinic at [phone] if your symptoms worsen or you have any concerns.  Best, Dr Adele

## 2024-05-07 NOTE — Progress Notes (Signed)
    SUBJECTIVE:   CHIEF COMPLAINT / HPI:   Concern for BV or yeast infection --tested on 9/29, negative for GC/CT, trich. Declined HIV/RPR at that time. Declines HIV/RPR today. --no new partners since last visit --some odor after period ended --no vaginal itching or discharge --has had sex since LMP but is sure period began 10/10 (<7 days ago) --expressed some interest in IUD at last visit, may be interested in this. Not thrilled about nexplanon or OCPs.  --would like to have children in the next few years but not now  Elevated blood pressure --139/99 at visit 9/29, similarly elevated today --does not feel particularly anxious when coming to clinic --no chest pain, sob, headache  PERTINENT  PMH / PSH: schizophrenia, bipolar affective  OBJECTIVE:   BP (!) 131/112   Pulse 71   Ht 5' 7 (1.702 m)   Wt 205 lb (93 kg)   LMP 05/03/2024 (Approximate)   SpO2 99%   BMI 32.11 kg/m   General: Awake and conversant, no acute distress Pulm: normal work of breathing on room air Neuro: No focal deficits Psych: Appropriate mood and affect GU (chaperone Insurance risk surveyor, CMA present): normal external female genitalia, normal vaginal rugae, moderate blood and thick white discharge at cervical os. No lesions or other abnormalities noted.   ASSESSMENT/PLAN:   Assessment & Plan Vaginal odor Candida albicans infection BV (bacterial vaginosis) Vaginal odor, history of BV and candida (last treated in late Sept). No new sexual partners, within 7 days of LMP so no indication for pregnancy testing today. Declined other STI testing today (neg GC/CT at prior appt). Wet prep positive for BV and candida today. Will tx with 7 days flagyl  followed by fluconazole . - fluconazole  (DIFLUCAN ) 150 MG tablet; Take 1 tablet (150 mg total) by mouth once for 1 dose. Repeat in 3 days if symptoms not better. AFTER COMPLETION OF ANTIBIOTICS  Dispense: 2 tablet; Refill: 0 - metroNIDAZOLE  (FLAGYL ) 500 MG tablet; Take  1 tablet (500 mg total) by mouth 2 (two) times daily for 7 days. Do not drink alcohol while taking this medication.  Dispense: 14 tablet; Refill: 0  Encounter for other general counseling or advice on contraception Discussed contraception options, patient will consider these and schedule f/u appt with PCP. Would like to conceive in the next few years but not right now. Advised condom use to prevent unwanted pregnancy/STI in the meantime.  Elevated BP without diagnosis of hypertension Elevated readings x2 today and at last appt. Mom with HTN. No red flag sx today. Advised checking BP daily and keeping log. Advised PCP follow up in 1-2 weeks, can discuss treatment options at that time if home readings are elevated. - Blood Pressure Monitoring (BLOOD PRESSURE CUFF) MISC; Check blood pressure once daily  Dispense: 1 each; Refill: 0     Rea Raring, MD [facility]

## 2024-05-29 ENCOUNTER — Encounter: Payer: Self-pay | Admitting: Family Medicine

## 2024-05-29 ENCOUNTER — Other Ambulatory Visit (HOSPITAL_COMMUNITY)
Admission: RE | Admit: 2024-05-29 | Discharge: 2024-05-29 | Disposition: A | Discharge status: Home / Self Care | Source: Ambulatory Visit | Attending: Family Medicine | Admitting: Family Medicine

## 2024-05-29 ENCOUNTER — Ambulatory Visit (INDEPENDENT_AMBULATORY_CARE_PROVIDER_SITE_OTHER): Admitting: Family Medicine

## 2024-05-29 VITALS — BP 130/100 | HR 61 | Ht 67.0 in | Wt 212.0 lb

## 2024-05-29 DIAGNOSIS — B3731 Acute candidiasis of vulva and vagina: Secondary | ICD-10-CM

## 2024-05-29 DIAGNOSIS — N898 Other specified noninflammatory disorders of vagina: Secondary | ICD-10-CM

## 2024-05-29 DIAGNOSIS — I10 Essential (primary) hypertension: Secondary | ICD-10-CM | POA: Diagnosis not present

## 2024-05-29 LAB — POCT WET PREP (WET MOUNT)
Clue Cells Wet Prep Whiff POC: NEGATIVE
Trichomonas Wet Prep HPF POC: ABSENT
WBC, Wet Prep HPF POC: >20

## 2024-05-29 LAB — POCT URINE PREGNANCY: Preg Test, Ur: NEGATIVE

## 2024-05-29 MED ORDER — FLUCONAZOLE 150 MG PO TABS: 150.0000 mg | ORAL_TABLET | Freq: Once | ORAL | 0 refills | Status: AC

## 2024-05-29 NOTE — Addendum Note (Signed)
 Addended byBETHA COWARD, Naftuli Dalsanto on: 05/29/2024 11:36 AM   Modules accepted: Level of Service

## 2024-05-29 NOTE — Patient Instructions (Addendum)
 If any of your results from today are abnormal and/or require changes to your medical care, I will give you a call. Otherwise, I will send you a letter in the mail or a message on MyChart.   Pick up a BP cuff from your pharmacy - I recommend the brand Omron  Blood Pressure Record Sheet To take your blood pressure, you will need a blood pressure machine. You can buy a blood pressure machine (blood pressure monitor) at your clinic, drug store, or online. When choosing one, consider: An automatic monitor that has an arm cuff. A cuff that wraps snugly around your upper arm. You should be able to fit only one finger between your arm and the cuff. A device that stores blood pressure reading results. Do not choose a monitor that measures your blood pressure from your wrist or finger. Follow your health care provider's instructions for how to take your blood pressure. To use this form: Take your blood pressure medications every day These measurements should be taken when you have been at rest for at least 10-15 min Take at least 2 readings with each blood pressure check. This makes sure the results are correct. Wait 1-2 minutes between measurements. Write down the results in the spaces on this form. Keep in mind it should always be recorded systolic over diastolic. Both numbers are important.  Repeat this every day for 2-3 weeks, or as told by your health care provider.  Make a follow-up appointment with your health care provider to discuss the results.  Blood Pressure Log Date Medications taken? (Y/N) Blood Pressure Time of Day

## 2024-05-29 NOTE — Progress Notes (Signed)
    SUBJECTIVE:   CHIEF COMPLAINT / HPI:   STI check -Vaginal discharge, pain, itching: mild whitish discharge -Abdominal pain, fevers: no -previous history: yeast, bv -new partner or partner exposure to STI: no -desires oral swab: no -LMP: mid october -contraception: not interested -pap: UTD, NILM 2024   PERTINENT  PMH / PSH: hx yeast infection  OBJECTIVE:   BP (!) 130/100   Pulse 61   Ht 5' 7 (1.702 m)   Wt 212 lb (96.2 kg)   LMP 05/03/2024 (Approximate)   SpO2 99%   BMI 33.20 kg/m   General: NAD, pleasant, able to participate in exam Respiratory: No respiratory distress Skin: warm and dry, no rashes noted Psych: Normal affect and mood  Pelvic exam per Dr. Delores (pt preferred female provider)  ASSESSMENT/PLAN:   Assessment & Plan Candida vaginitis Vaginal discharge Upreg neg Wet mount shows yeast, rx diflucan  Discussed precautions regarding birth control Hypertension, unspecified type Pt declines medication and bloodwork today Has had multiple elevated readings Discussed risks of this. Pt to follow up within 2 weeks Provided BP log and recommend regular monitoring at home, discussed return precautions Patient scheduled 12/2, would also screen for diabetes and HLD at that visit   Payton Coward, MD [facility]

## 2024-05-31 LAB — CERVICOVAGINAL ANCILLARY ONLY
Chlamydia: NEGATIVE
Comment: NEGATIVE
Comment: NEGATIVE
Comment: NORMAL
Neisseria Gonorrhea: NEGATIVE
Trichomonas: NEGATIVE

## 2024-06-01 ENCOUNTER — Ambulatory Visit: Payer: Self-pay | Admitting: Family Medicine

## 2024-06-13 DIAGNOSIS — Z136 Encounter for screening for cardiovascular disorders: Secondary | ICD-10-CM | POA: Diagnosis not present

## 2024-06-13 DIAGNOSIS — Z1329 Encounter for screening for other suspected endocrine disorder: Secondary | ICD-10-CM | POA: Diagnosis not present

## 2024-06-13 DIAGNOSIS — N951 Menopausal and female climacteric states: Secondary | ICD-10-CM | POA: Diagnosis not present

## 2024-06-13 DIAGNOSIS — E559 Vitamin D deficiency, unspecified: Secondary | ICD-10-CM | POA: Diagnosis not present

## 2024-06-13 DIAGNOSIS — Z6830 Body mass index (BMI) 30.0-30.9, adult: Secondary | ICD-10-CM | POA: Diagnosis not present

## 2024-06-13 DIAGNOSIS — Z130 Encounter for screening for diseases of the blood and blood-forming organs and certain disorders involving the immune mechanism: Secondary | ICD-10-CM | POA: Diagnosis not present

## 2024-06-13 DIAGNOSIS — Z131 Encounter for screening for diabetes mellitus: Secondary | ICD-10-CM | POA: Diagnosis not present

## 2024-06-13 DIAGNOSIS — R635 Abnormal weight gain: Secondary | ICD-10-CM | POA: Diagnosis not present

## 2024-06-24 DIAGNOSIS — M2550 Pain in unspecified joint: Secondary | ICD-10-CM | POA: Diagnosis not present

## 2024-06-24 DIAGNOSIS — E669 Obesity, unspecified: Secondary | ICD-10-CM | POA: Diagnosis not present

## 2024-06-24 DIAGNOSIS — R635 Abnormal weight gain: Secondary | ICD-10-CM | POA: Diagnosis not present

## 2024-06-24 DIAGNOSIS — Z1331 Encounter for screening for depression: Secondary | ICD-10-CM | POA: Diagnosis not present

## 2024-06-24 DIAGNOSIS — E559 Vitamin D deficiency, unspecified: Secondary | ICD-10-CM | POA: Diagnosis not present

## 2024-06-24 DIAGNOSIS — Z1339 Encounter for screening examination for other mental health and behavioral disorders: Secondary | ICD-10-CM | POA: Diagnosis not present

## 2024-06-25 ENCOUNTER — Ambulatory Visit: Admitting: Family Medicine

## 2024-07-16 ENCOUNTER — Ambulatory Visit: Payer: Self-pay | Admitting: Family Medicine

## 2024-07-16 ENCOUNTER — Ambulatory Visit: Admitting: Family Medicine

## 2024-07-16 ENCOUNTER — Encounter: Payer: Self-pay | Admitting: Family Medicine

## 2024-07-16 ENCOUNTER — Other Ambulatory Visit (HOSPITAL_COMMUNITY)
Admission: RE | Admit: 2024-07-16 | Discharge: 2024-07-16 | Disposition: A | Discharge status: Home / Self Care | Source: Ambulatory Visit

## 2024-07-16 VITALS — Ht 67.0 in | Wt 202.2 lb

## 2024-07-16 DIAGNOSIS — Z3200 Encounter for pregnancy test, result unknown: Secondary | ICD-10-CM | POA: Diagnosis present

## 2024-07-16 DIAGNOSIS — Z7251 High risk heterosexual behavior: Secondary | ICD-10-CM

## 2024-07-16 LAB — POCT URINE PREGNANCY: Preg Test, Ur: NEGATIVE

## 2024-07-16 MED ORDER — LEVONORGESTREL 1.5 MG PO TABS: 1.5000 mg | ORAL_TABLET | Freq: Once | ORAL | 0 refills | Status: AC

## 2024-07-16 NOTE — Patient Instructions (Addendum)
 Good to see you today - Thank you for coming in  Things we discussed today:  Your pregnancy test was negative today.  You can take the Plan B  pill soon as possible.  Go to goodrx.com to get a coupon for this.  There is still a chance of pregnancy if you take the Plan B  pill today.  I will let you know if your STD testing is abnormal.

## 2024-07-16 NOTE — Progress Notes (Deleted)
" ° ° °  SUBJECTIVE:   CHIEF COMPLAINT / HPI:   STI check -Vaginal discharge, pain, itching: *** -Abdominal pain, fevers: *** -previous history: *** -new partner or partner exposure to STI: *** -desires oral swab: *** -LMP: *** -most recent sexual encounter: *** -contraception: *** -pap: *** ***  PERTINENT  PMH / PSH: ***  OBJECTIVE:   There were no vitals taken for this visit.  ***  ASSESSMENT/PLAN:   Assessment & Plan Screen for STD (sexually transmitted disease)    Payton Coward, MD [facility] "

## 2024-07-16 NOTE — Progress Notes (Signed)
" ° ° °  SUBJECTIVE:   CHIEF COMPLAINT / HPI:   Had unprotected sexual encounter 3 days ago Some cramping in pelvic region since then, no urinary symptoms, no vaginal symptoms Would like to be tested for pregnancy and STD  PERTINENT  PMH / PSH: reviewed  OBJECTIVE:   Ht 5' 7 (1.702 m)   Wt 202 lb 4 oz (91.7 kg)   LMP 07/05/2024   BMI 31.68 kg/m   General: well appearing, NAD Respiratory: normal work of breathing on RA Psych: normal affect  ASSESSMENT/PLAN:   Assessment & Plan Possible pregnancy Unprotected sex Upreg negative.  Patient would like Plan B  pill.  Provided as it has been within 72 hours, however advised that it may not be effective at this point in preventing ovulation.  Advised to take another pregnancy test if she does not get her next menstrual cycle. STD testing performed today with self swab at patient request, patient declined HIV and syphilis testing.     Elyce Prescott, DO [facility] "

## 2024-07-17 ENCOUNTER — Ambulatory Visit: Payer: Self-pay | Admitting: Family Medicine

## 2024-07-17 LAB — CERVICOVAGINAL ANCILLARY ONLY
Chlamydia: NEGATIVE
Comment: NEGATIVE
Comment: NEGATIVE
Comment: NORMAL
Neisseria Gonorrhea: NEGATIVE
Trichomonas: NEGATIVE

## 2024-07-23 ENCOUNTER — Ambulatory Visit (INDEPENDENT_AMBULATORY_CARE_PROVIDER_SITE_OTHER): Admitting: Student

## 2024-07-23 ENCOUNTER — Other Ambulatory Visit (HOSPITAL_COMMUNITY)
Admission: RE | Admit: 2024-07-23 | Discharge: 2024-07-23 | Disposition: A | Discharge status: Home / Self Care | Source: Ambulatory Visit | Attending: Family Medicine | Admitting: Family Medicine

## 2024-07-23 ENCOUNTER — Ambulatory Visit: Payer: Self-pay

## 2024-07-23 VITALS — BP 145/101 | HR 77 | Wt 202.2 lb

## 2024-07-23 DIAGNOSIS — N926 Irregular menstruation, unspecified: Secondary | ICD-10-CM | POA: Diagnosis not present

## 2024-07-23 DIAGNOSIS — Z113 Encounter for screening for infections with a predominantly sexual mode of transmission: Secondary | ICD-10-CM | POA: Insufficient documentation

## 2024-07-23 LAB — POCT WET PREP (WET MOUNT)
Clue Cells Wet Prep Whiff POC: NEGATIVE
Trichomonas Wet Prep HPF POC: ABSENT

## 2024-07-23 LAB — POCT URINE PREGNANCY: Preg Test, Ur: NEGATIVE

## 2024-07-23 NOTE — Patient Instructions (Signed)
 It was great to see you today!   I have ordered lab work today. I will send you a message through MyChart or send you a letter with your results. If there is an abnormal result, I will give you a call.    No future appointments.  Please arrive 15 minutes before your appointment to ensure smooth check in process.  If you are more than 15 minutes late, you may be asked to reschedule.   Please bring a list of your medications with you to all appointments.   Please call the clinic at [phone] if your symptoms worsen or you have any concerns.  Thank you for allowing me to participate in your care, Dr. Damien Pinal [facility]

## 2024-07-23 NOTE — Progress Notes (Addendum)
" ° ° °  SUBJECTIVE:   CHIEF COMPLAINT / HPI:   Sandra Anderson is a 33 y.o. female  presenting for STD testing due to a new partner. She denies vaginal symptoms of discharge or pain with intercourse.   Not currently on birth control  Desires pregnancy Not taking prenatal vitamins at this time  PERTINENT  PMH / PSH: Reviewed and updated   OBJECTIVE:   Pulse 77   Wt 202 lb 3.2 oz (91.7 kg)   LMP 06/05/2024   SpO2 100%   BMI 31.67 kg/m   Well-appearing, no acute distress Cardio: Regular rate, regular rhythm, no murmurs on exam. Pulm: Clear, no wheezing, no crackles. No increased work of breathing Abdominal: bowel sounds present, soft, non-tender, non-distended Extremities: no peripheral edema   Pelvic Exam: MA chaperone present Stacey Reaves  Normal external genitalia Discharge: normal   ASSESSMENT/PLAN:   Assessment & Plan Screening examination for STD (sexually transmitted disease) Wet prep preformed negative for BV, yeast, and trich.  GC testing collected.  RPR and HIV discussed and declined.  Birthcontrol and safe sex practices discussed with patient.  Missed period Pregnancy test negative.  Discussed starting prenatal vitamins due to desired pregnancy  Follow up as needed    Damien Pinal, DO [facility]   "

## 2024-07-24 ENCOUNTER — Ambulatory Visit: Payer: Self-pay | Admitting: Student

## 2024-07-24 LAB — CERVICOVAGINAL ANCILLARY ONLY
Chlamydia: NEGATIVE
Comment: NEGATIVE
Comment: NEGATIVE
Comment: NORMAL
Neisseria Gonorrhea: NEGATIVE
Trichomonas: NEGATIVE

## 2024-08-13 ENCOUNTER — Other Ambulatory Visit: Payer: Self-pay

## 2024-08-14 ENCOUNTER — Other Ambulatory Visit: Payer: Self-pay
